# Patient Record
Sex: Male | Born: 1970
Health system: Southern US, Community
[De-identification: ages and names within clinical notes are randomized; demographics above are authoritative.]

## PROBLEM LIST (undated history)

## (undated) DIAGNOSIS — I10 Essential (primary) hypertension: Secondary | ICD-10-CM

## (undated) DIAGNOSIS — M549 Dorsalgia, unspecified: Secondary | ICD-10-CM

## (undated) DIAGNOSIS — G8929 Other chronic pain: Secondary | ICD-10-CM

## (undated) DIAGNOSIS — L409 Psoriasis, unspecified: Principal | ICD-10-CM

## (undated) DIAGNOSIS — I4891 Unspecified atrial fibrillation: Secondary | ICD-10-CM

## (undated) DIAGNOSIS — E785 Hyperlipidemia, unspecified: Secondary | ICD-10-CM

## (undated) HISTORY — PX: HERNIA REPAIR: SHX51

## (undated) HISTORY — DX: Essential (primary) hypertension: I10

## (undated) HISTORY — DX: Hyperlipidemia, unspecified: E78.5

## (undated) HISTORY — PX: INGUINAL HERNIA REPAIR: SUR1180

## (undated) HISTORY — DX: Psoriasis, unspecified: L40.9

---

## 2008-06-03 ENCOUNTER — Encounter: Payer: Self-pay | Admitting: Internal Medicine

## 2008-06-20 ENCOUNTER — Encounter: Payer: Self-pay | Admitting: Internal Medicine

## 2008-07-08 ENCOUNTER — Ambulatory Visit: Payer: Self-pay | Admitting: Internal Medicine

## 2008-07-08 DIAGNOSIS — E785 Hyperlipidemia, unspecified: Secondary | ICD-10-CM | POA: Insufficient documentation

## 2010-03-26 ENCOUNTER — Ambulatory Visit: Payer: Self-pay | Admitting: Internal Medicine

## 2010-04-03 ENCOUNTER — Ambulatory Visit: Payer: Self-pay | Admitting: Internal Medicine

## 2010-04-03 DIAGNOSIS — T753XXA Motion sickness, initial encounter: Secondary | ICD-10-CM | POA: Insufficient documentation

## 2010-04-03 LAB — CONVERTED CEMR LAB
HDL goal, serum: 40 mg/dL
LDL Goal: 160 mg/dL

## 2010-08-18 NOTE — Assessment & Plan Note (Signed)
Summary: req patch for a trip/cd   Vital Signs:  Patient profile:   40 year old male Height:      71 inches Weight:      190.25 pounds BMI:     26.63 O2 Sat:      97 % on Room air Temp:     97 degrees F oral Pulse rate:   70 / minute Pulse rhythm:   regular Resp:     20 per minute BP sitting:   134 / 80  (right arm)  Vitals Entered By: Cristy Hilts, RN (April 03, 2010 3:34 PM)  Nutrition Counseling: Patient's BMI is greater than 25 and therefore counseled on weight management options.  O2 Flow:  Room air CC: would like a script for nausea patch would like to discuss Chloesterol med (has been off Lipitor x1 yr., Lipid Management   Primary Care Georgie Eduardo:  Etta Grandchild MD  CC:  would like a script for nausea patch would like to discuss Chloesterol med (has been off Lipitor x1 yr. and Lipid Management.  History of Present Illness: He returns and requests an Rx for motion sickness, he is going on a fishing trip next week.  Also, he wants to treat his high cholesterol levels.  Lipid Management History:      Negative NCEP/ATP III risk factors include male age less than 18 years old, non-diabetic, no family history for ischemic heart disease, non-tobacco-user status, non-hypertensive, no ASHD (atherosclerotic heart disease), no prior stroke/TIA, no peripheral vascular disease, and no history of aortic aneurysm.        The patient states that he does not know about the "Therapeutic Lifestyle Change" diet.  His compliance with the TLC diet is good.  The patient expresses understanding of adjunctive measures for cholesterol lowering.  Adjunctive measures started by the patient include aerobic exercise, fiber, limit alcohol consumpton, and weight reduction.  He expresses no side effects from his lipid-lowering medication.  The patient denies any symptoms to suggest myopathy or liver disease.     Preventive Screening-Counseling & Management  Alcohol-Tobacco     Alcohol  drinks/day: 0     Smoking Status: never     Passive Smoke Exposure: no     Tobacco Counseling: not indicated; no tobacco use  Hep-HIV-STD-Contraception     Hepatitis Risk: no risk noted     HIV Risk: no risk noted     STD Risk: no risk noted      Sexual History:  currently monogamous.        Drug Use:  never.        Blood Transfusions:  no.    Medications Prior to Update: 1)  None  Current Medications (verified): 1)  Transderm-Scop 1.5 Mg Pt72 (Scopolamine Base) .... Apply As Directed 2)  Crestor 20 Mg Tabs (Rosuvastatin Calcium) .... One By Mouth Once Daily  Allergies (verified): 1)  ! Penicillin  Past History:  Past Medical History: Last updated: 07/08/2008 Hyperlipidemia  Past Surgical History: Last updated: 07/08/2008 Hemorrhoidectomy  Family History: Last updated: 07/08/2008 Family History Diabetes 1st degree relative Family History High cholesterol  Social History: Last updated: 04/03/2010 Occupation: firefighter Married Never Smoked Alcohol use-no Drug use-no Regular exercise-yes  Risk Factors: Alcohol Use: 0 (04/03/2010) Exercise: yes (07/08/2008)  Risk Factors: Smoking Status: never (04/03/2010) Passive Smoke Exposure: no (04/03/2010)  Social History: Occupation: IT sales professional Married Never Smoked Alcohol use-no Drug use-no Regular exercise-yes Hepatitis Risk:  no risk noted HIV Risk:  no risk noted STD  Risk:  no risk noted Sexual History:  currently monogamous Drug Use:  never Blood Transfusions:  no  Review of Systems  The patient denies anorexia, fever, weight loss, weight gain, chest pain, syncope, dyspnea on exertion, peripheral edema, prolonged cough, headaches, hemoptysis, abdominal pain, hematuria, suspicious skin lesions, transient blindness, difficulty walking, and enlarged lymph nodes.    Physical Exam  General:  alert, well-developed, well-nourished, well-hydrated, and appropriate dress.   Mouth:  Oral mucosa and  oropharynx without lesions or exudates.  Teeth in good repair. Neck:  supple, full ROM, no masses, and no thyromegaly.   Lungs:  Normal respiratory effort, chest expands symmetrically. Lungs are clear to auscultation, no crackles or wheezes. Heart:  Normal rate and regular rhythm. S1 and S2 normal without gallop, murmur, click, rub or other extra sounds. Abdomen:  soft, non-tender, normal bowel sounds, no distention, no masses, no guarding, no rigidity, no rebound tenderness, no hepatomegaly, and no splenomegaly.   Msk:  No deformity or scoliosis noted of thoracic or lumbar spine.   Pulses:  R and L carotid,radial,femoral,dorsalis pedis and posterior tibial pulses are full and equal bilaterally Extremities:  No clubbing, cyanosis, edema, or deformity noted with normal full range of motion of all joints.   Neurologic:  No cranial nerve deficits noted. Station and gait are normal. Plantar reflexes are down-going bilaterally. DTRs are symmetrical throughout. Sensory, motor and coordinative functions appear intact. Skin:  Intact without suspicious lesions or rashes Cervical Nodes:  No lymphadenopathy noted Inguinal Nodes:  no R inguinal adenopathy and no L inguinal adenopathy.   Psych:  Cognition and judgment appear intact. Alert and cooperative with normal attention span and concentration. No apparent delusions, illusions, hallucinations   Impression & Recommendations:  Problem # 1:  MOTION SICKNESS (ICD-994.6) Assessment New try transderm-scop patch  Problem # 2:  HYPERLIPIDEMIA (ICD-272.4) Assessment: Unchanged  His updated medication list for this problem includes:    Crestor 20 Mg Tabs (Rosuvastatin calcium) ..... One by mouth once daily  Lipid Goals: Chol Goal: 200 (04/03/2010)   HDL Goal: 40 (04/03/2010)   LDL Goal: 160 (04/03/2010)   TG Goal: 150 (04/03/2010)  10 Yr Risk Heart Disease: Not enough information  Complete Medication List: 1)  Transderm-scop 1.5 Mg Pt72 (Scopolamine  base) .... Apply as directed 2)  Crestor 20 Mg Tabs (Rosuvastatin calcium) .... One by mouth once daily  Lipid Assessment/Plan:      Based on NCEP/ATP III, the patient's risk factor category is "0-1 risk factors".  The patient's lipid goals are as follows: Total cholesterol goal is 200; LDL cholesterol goal is 160; HDL cholesterol goal is 40; Triglyceride goal is 150.    Patient Instructions: 1)  Please schedule a follow-up appointment in 2 months. Prescriptions: CRESTOR 20 MG TABS (ROSUVASTATIN CALCIUM) One by mouth once daily  #30 x 11   Entered and Authorized by:   Etta Grandchild MD   Signed by:   Etta Grandchild MD on 04/03/2010   Method used:   Print then Give to Patient   RxID:   [id number] TRANSDERM-SCOP 1.5 MG PT72 (SCOPOLAMINE BASE) apply as directed  #4 x 2   Entered and Authorized by:   Etta Grandchild MD   Signed by:   Etta Grandchild MD on 04/03/2010   Method used:   Print then Give to Patient   RxID:   [id number]

## 2011-08-10 ENCOUNTER — Ambulatory Visit (INDEPENDENT_AMBULATORY_CARE_PROVIDER_SITE_OTHER)
Admission: RE | Admit: 2011-08-10 | Discharge: 2011-08-10 | Disposition: A | Discharge status: Home / Self Care | Payer: 59 | Source: Ambulatory Visit | Attending: Internal Medicine | Admitting: Internal Medicine

## 2011-08-10 ENCOUNTER — Ambulatory Visit (INDEPENDENT_AMBULATORY_CARE_PROVIDER_SITE_OTHER): Payer: 59 | Admitting: Internal Medicine

## 2011-08-10 ENCOUNTER — Encounter: Payer: Self-pay | Admitting: Internal Medicine

## 2011-08-10 VITALS — BP 132/82 | HR 73 | Temp 97.1°F | Resp 16 | Wt 191.0 lb

## 2011-08-10 DIAGNOSIS — M25532 Pain in left wrist: Secondary | ICD-10-CM

## 2011-08-10 DIAGNOSIS — E785 Hyperlipidemia, unspecified: Secondary | ICD-10-CM

## 2011-08-10 DIAGNOSIS — M25539 Pain in unspecified wrist: Secondary | ICD-10-CM

## 2011-08-10 DIAGNOSIS — S63599A Other specified sprain of unspecified wrist, initial encounter: Secondary | ICD-10-CM

## 2011-08-10 MED ORDER — NAPROXEN 500 MG PO TABS: 500.0000 mg | ORAL_TABLET | Freq: Two times a day (BID) | ORAL | Status: DC

## 2011-08-10 MED ORDER — ROSUVASTATIN CALCIUM 20 MG PO TABS: 20.0000 mg | ORAL_TABLET | Freq: Every day | ORAL | Status: DC

## 2011-08-10 NOTE — Assessment & Plan Note (Signed)
Will start him on an nsaid and I gave him pt ed material as well

## 2011-08-10 NOTE — Assessment & Plan Note (Signed)
I will get a plain xray done to look for occult fracture, DJD, effusion

## 2011-08-10 NOTE — Patient Instructions (Signed)
Wrist Pain Wrist injuries are frequent in adults and children. A sprain is an injury to the ligaments that hold your bones together. A strain is an injury to muscle or muscle cord-like structures (tendons) from stretching or pulling. Generally, when wrists are moderately tender to touch following a fall or injury, a break in the bone (fracture) may be present. Most wrist sprains or strains are better in 3 to 5 days, but complete healing may take several weeks. HOME CARE INSTRUCTIONS   Put ice on the injured area.   Put ice in a plastic bag.   Place a towel between your skin and the bag.   Leave the ice on for 15 to 20 minutes, 3 to 4 times a day, for the first 2 days.   Keep your arm raised above the level of your heart whenever possible to reduce swelling and pain.   Rest the injured area for at least 48 hours or as directed by your caregiver.   If a splint or elastic bandage has been applied, use it for as long as directed by your caregiver or until seen by a caregiver for a follow-up exam.   Only take over-the-counter or prescription medicines for pain, discomfort, or fever as directed by your caregiver.   Keep all follow-up appointments. You may need to follow up with a specialist or have follow-up X-rays. Improvement in pain level is not a guarantee that you did not fracture a bone in your wrist. The only way to determine whether or not you have a broken bone is by X-ray.  SEEK IMMEDIATE MEDICAL CARE IF:   Your fingers are swollen, very red, white, or cold and blue.   Your fingers are numb or tingling.   You have increasing pain.   You have difficulty moving your fingers.  MAKE SURE YOU:   Understand these instructions.   Will watch your condition.   Will get help right away if you are not doing well or get worse.  Document Released: 04/14/2005 Document Revised: 03/17/2011 Document Reviewed: 08/26/2010 [employer] Patient Information 2012 [employer]. 

## 2011-08-10 NOTE — Progress Notes (Signed)
Subjective:    Patient ID: Wayne Morales, male    DOB: 04-Aug-1970, 41 y.o.   MRN: [id number]  Wrist Pain  The pain is present in the left wrist. This is a chronic problem. The current episode started more than 1 year ago. There has been a history of trauma. The problem occurs intermittently. The problem has been unchanged. The quality of the pain is described as aching. The pain is at a severity of 2/10. The pain is mild. Pertinent negatives include no fever, inability to bear weight, itching, joint locking, joint swelling, limited range of motion, numbness, stiffness or tingling. The symptoms are aggravated by activity. He has tried nothing for the symptoms. Family history does not include gout or rheumatoid arthritis. There is no history of diabetes, gout, osteoarthritis or rheumatoid arthritis.  Hyperlipidemia This is a chronic problem. The current episode started more than 1 year ago. The problem is controlled. Recent lipid tests were reviewed and are variable. He has no history of chronic renal disease, diabetes, hypothyroidism, liver disease, obesity or nephrotic syndrome. Factors aggravating his hyperlipidemia include no known factors. Pertinent negatives include no chest pain, focal sensory loss, focal weakness, leg pain, myalgias or shortness of breath. Current antihyperlipidemic treatment includes statins. The current treatment provides moderate improvement of lipids. There are no compliance problems.       Review of Systems  Constitutional: Negative for fever, chills, diaphoresis, activity change, appetite change, fatigue and unexpected weight change.  HENT: Negative.   Eyes: Negative.   Respiratory: Negative for cough, chest tightness, shortness of breath, wheezing and stridor.   Cardiovascular: Negative for chest pain, palpitations and leg swelling.  Gastrointestinal: Negative for nausea, vomiting, abdominal pain, diarrhea, constipation, blood in stool and abdominal distention.    Genitourinary: Negative.   Musculoskeletal: Positive for arthralgias (left wrist pain). Negative for myalgias, back pain, joint swelling, gait problem, stiffness and gout.  Skin: Negative for color change, itching, pallor, rash and wound.  Neurological: Negative for dizziness, tingling, tremors, focal weakness, seizures, syncope, facial asymmetry, speech difficulty, weakness, light-headedness, numbness and headaches.  Hematological: Negative for adenopathy. Does not bruise/bleed easily.  Psychiatric/Behavioral: Negative.        Objective:   Physical Exam  Vitals reviewed. Constitutional: He is oriented to person, place, and time. He appears well-developed and well-nourished. No distress.  HENT:  Head: Normocephalic and atraumatic.  Mouth/Throat: Oropharynx is clear and moist. No oropharyngeal exudate.  Eyes: Conjunctivae are normal. Right eye exhibits no discharge. Left eye exhibits no discharge. No scleral icterus.  Neck: Normal range of motion. Neck supple. No JVD present. No tracheal deviation present. No thyromegaly present.  Cardiovascular: Normal rate, regular rhythm, normal heart sounds and intact distal pulses.  Exam reveals no gallop and no friction rub.   No murmur heard. Pulmonary/Chest: Effort normal and breath sounds normal. No stridor. No respiratory distress. He has no wheezes. He has no rales. He exhibits no tenderness.  Abdominal: Soft. Bowel sounds are normal. He exhibits no distension. There is no tenderness. There is no rebound and no guarding.  Musculoskeletal: Normal range of motion. He exhibits no edema and no tenderness.       Left wrist: Normal. He exhibits normal range of motion, no tenderness, no bony tenderness, no swelling, no effusion, no crepitus, no deformity and no laceration.  Lymphadenopathy:    He has no cervical adenopathy.  Neurological: He is oriented to person, place, and time.  Skin: Skin is warm and dry. No rash noted. He  is not diaphoretic. No  erythema. No pallor.  Psychiatric: He has a normal mood and affect. His behavior is normal. Judgment and thought content normal.          Assessment & Plan:

## 2011-08-10 NOTE — Assessment & Plan Note (Signed)
He is doing well on crestor so will continue it for now

## 2011-10-21 ENCOUNTER — Ambulatory Visit (INDEPENDENT_AMBULATORY_CARE_PROVIDER_SITE_OTHER): Payer: 59 | Admitting: Internal Medicine

## 2011-10-21 ENCOUNTER — Encounter: Payer: Self-pay | Admitting: Internal Medicine

## 2011-10-21 VITALS — BP 130/82 | HR 76 | Temp 97.6°F | Wt 190.1 lb

## 2011-10-21 DIAGNOSIS — H109 Unspecified conjunctivitis: Secondary | ICD-10-CM

## 2011-10-21 MED ORDER — TOBRAMYCIN-DEXAMETHASONE 0.3-0.1 % OP SUSP: 1.0000 [drp] | OPHTHALMIC | Status: AC

## 2011-10-21 NOTE — Patient Instructions (Signed)
Conjunctivitis Conjunctivitis is commonly called "pink eye." Conjunctivitis can be caused by bacterial or viral infection, allergies, or injuries. There is usually redness of the lining of the eye, itching, discomfort, and sometimes discharge. There may be deposits of matter along the eyelids. A viral infection usually causes a watery discharge, while a bacterial infection causes a yellowish, thick discharge. Pink eye is very contagious and spreads by direct contact. You may be given antibiotic eyedrops as part of your treatment. Before using your eye medicine, remove all drainage from the eye by washing gently with warm water and cotton balls. Continue to use the medication until you have awakened 2 mornings in a row without discharge from the eye. Do not rub your eye. This increases the irritation and helps spread infection. Use separate towels from other household members. Wash your hands with soap and water before and after touching your eyes. Use cold compresses to reduce pain and sunglasses to relieve irritation from light. Do not wear contact lenses or wear eye makeup until the infection is gone. SEEK MEDICAL CARE IF:   Your symptoms are not better after 3 days of treatment.   You have increased pain or trouble seeing.   The outer eyelids become very red or swollen.  Document Released: 08/12/2004 Document Revised: 06/24/2011 Document Reviewed: 07/05/2005 [employer] Patient Information 2012 [employer]. 

## 2011-10-21 NOTE — Progress Notes (Signed)
Subjective:    Patient ID: Wayne Morales, male    DOB: August 27, 1970, 41 y.o.   MRN: [id number]  Conjunctivitis  The current episode started 2 days ago. The onset was gradual. The problem occurs continuously. The problem has been unchanged. The problem is mild. Associated symptoms include eye itching, photophobia, eye discharge and eye redness. Pertinent negatives include no orthopnea, no fever, no decreased vision, no double vision, no abdominal pain, no constipation, no diarrhea, no nausea, no vomiting, no congestion, no ear discharge, no ear pain, no headaches, no hearing loss, no mouth sores, no rhinorrhea, no sore throat, no stridor, no swollen glands, no muscle aches, no neck pain, no neck stiffness, no cough, no URI, no wheezing, no rash and no eye pain. The eye pain is mild. There is pain in the left eye. The eye pain is not associated with movement. The eyelid exhibits swelling. There were no sick contacts.      Review of Systems  Constitutional: Negative.  Negative for fever.  HENT: Negative.  Negative for hearing loss, ear pain, congestion, sore throat, rhinorrhea, mouth sores, neck pain and ear discharge.   Eyes: Positive for photophobia, discharge, redness and itching. Negative for double vision and pain.  Respiratory: Negative.  Negative for cough, wheezing and stridor.   Cardiovascular: Negative.  Negative for orthopnea.  Gastrointestinal: Negative.  Negative for nausea, vomiting, abdominal pain, diarrhea and constipation.  Genitourinary: Negative.   Musculoskeletal: Negative.   Skin: Negative.  Negative for rash.  Neurological: Negative.  Negative for headaches.  Hematological: Negative.   Psychiatric/Behavioral: Negative.        Objective:   Physical Exam  Vitals reviewed. Constitutional: He is oriented to person, place, and time. He appears well-developed and well-nourished. No distress.  HENT:  Head: Normocephalic and atraumatic.  Mouth/Throat: No oropharyngeal  exudate.  Eyes: Pupils are equal, round, and reactive to light. Right eye exhibits no chemosis, no discharge, no exudate and no hordeolum. No foreign body present in the right eye. Left eye exhibits no chemosis, no discharge, no exudate and no hordeolum. No foreign body present in the left eye. Right conjunctiva is not injected. Right conjunctiva has no hemorrhage. Left conjunctiva is injected. Left conjunctiva has no hemorrhage. No scleral icterus. Right eye exhibits normal extraocular motion and no nystagmus. Left eye exhibits normal extraocular motion and no nystagmus. Right pupil is round and reactive. Left pupil is round and reactive. Pupils are equal.  Slit lamp exam:      The right eye shows no corneal abrasion and no corneal ulcer.       The left eye shows no corneal abrasion and no corneal ulcer.    Neck: Normal range of motion. Neck supple. No JVD present. No tracheal deviation present. No thyromegaly present.  Cardiovascular: Normal rate, regular rhythm, normal heart sounds and intact distal pulses.  Exam reveals no gallop and no friction rub.   No murmur heard. Pulmonary/Chest: Effort normal and breath sounds normal. No stridor. No respiratory distress. He has no wheezes. He has no rales. He exhibits no tenderness.  Abdominal: Soft. Bowel sounds are normal. He exhibits no distension and no mass. There is no tenderness. There is no rebound and no guarding.  Musculoskeletal: Normal range of motion. He exhibits no edema and no tenderness.  Lymphadenopathy:    He has no cervical adenopathy.  Neurological: He is oriented to person, place, and time.  Skin: Skin is warm and dry. No rash noted. He is not diaphoretic. No  erythema. No pallor.  Psychiatric: He has a normal mood and affect. His behavior is normal. Judgment and thought content normal.          Assessment & Plan:

## 2011-10-21 NOTE — Assessment & Plan Note (Signed)
Start tobradex ophth suspension, he was given pt ed material as well

## 2012-09-29 ENCOUNTER — Other Ambulatory Visit: Payer: Self-pay | Admitting: Internal Medicine

## 2012-11-03 ENCOUNTER — Encounter: Payer: Self-pay | Admitting: Internal Medicine

## 2012-11-03 ENCOUNTER — Other Ambulatory Visit (INDEPENDENT_AMBULATORY_CARE_PROVIDER_SITE_OTHER): Payer: 59

## 2012-11-03 ENCOUNTER — Ambulatory Visit (INDEPENDENT_AMBULATORY_CARE_PROVIDER_SITE_OTHER): Payer: 59 | Admitting: Internal Medicine

## 2012-11-03 VITALS — BP 122/78 | HR 66 | Temp 97.0°F | Ht 71.0 in | Wt 195.2 lb

## 2012-11-03 DIAGNOSIS — L408 Other psoriasis: Secondary | ICD-10-CM

## 2012-11-03 DIAGNOSIS — B351 Tinea unguium: Secondary | ICD-10-CM

## 2012-11-03 DIAGNOSIS — L409 Psoriasis, unspecified: Secondary | ICD-10-CM

## 2012-11-03 LAB — HEPATIC FUNCTION PANEL
ALT: 32 U/L (ref 0–53)
Bilirubin, Direct: 0.1 mg/dL (ref 0.0–0.3)
Total Protein: 8 g/dL (ref 6.0–8.3)

## 2012-11-03 MED ORDER — PREDNISONE 20 MG PO TABS: 20.0000 mg | ORAL_TABLET | Freq: Every day | ORAL | Status: DC

## 2012-11-03 MED ORDER — TERBINAFINE HCL 250 MG PO TABS: 250.0000 mg | ORAL_TABLET | Freq: Every day | ORAL | Status: DC

## 2012-11-03 NOTE — Progress Notes (Signed)
  Subjective:    Patient ID: Wayne Morales, male    DOB: [birth year], 42 y.o.   MRN: [id number]  HPI  Pt presents to the clinic today with c/o rash on his scalp. He noticed this 1 week ago. He does have a history of scalp psoriasis. It has flared up again and he is unable to get it to go away. It does itch. He has used coal tar shampoo in the past but would prefer to try oral therapy secondary to the cost of the shampoo. Additionally, he c/o toe nail fungus. He does wear boots all the time for his job. His feet do sweat. He has not tried any topical treatment for it. He would like to get treatment today. He has never had a issue with his liver.  Review of Systems      Past Medical History  Diagnosis Date  . Hyperlipidemia     Current Outpatient Prescriptions  Medication Sig Dispense Refill  . CRESTOR 20 MG tablet TAKE 1 TABLET BY MOUTH EVERY DAY  30 tablet  3   No current facility-administered medications for this visit.    Allergies  Allergen Reactions  . Penicillins     REACTION: Rash    Family History  Problem Relation Age of Onset  . Diabetes Other   . Hyperlipidemia Other     History   Social History  . Marital Status: Married    Spouse Name: N/A    Number of Children: N/A  . Years of Education: N/A   Occupational History  . Not on file.   Social History Main Topics  . Smoking status: Never Smoker   . Smokeless tobacco: Never Used  . Alcohol Use: No  . Drug Use: No  . Sexually Active: Yes   Other Topics Concern  . Not on file   Social History Narrative  . No narrative on file     Constitutional: Denies fever, malaise, fatigue, headache or abrupt weight changes. .  Skin: Pt reports psoriasis in his scalp and toe fungus. Denies redness, rashes, lesions or ulcercations.    No other specific complaints in a complete review of systems (except as listed in HPI above).  Objective:   Physical Exam  BP 122/78  Pulse 66  Temp(Src) 97 F (36.1 C)  (Oral)  Ht 5\' 11"  (1.803 m)  Wt 195 lb 4 oz (88.565 kg)  BMI 27.24 kg/m2  SpO2 95% Wt Readings from Last 3 Encounters:  11/03/12 195 lb 4 oz (88.565 kg)  10/21/11 190 lb 2 oz (86.24 kg)  08/10/11 191 lb (86.637 kg)    General: Appears their stated age, well developed, well nourished in NAD. Skin: Evidence of psoriasis at the base of the scalp. Pt has bilateral toe nail fungus. Cardiovascular: Normal rate and rhythm. S1,S2 noted.  No murmur, rubs or gallops noted. No JVD or BLE edema. No carotid bruits noted. Pulmonary/Chest: Normal effort and positive vesicular breath sounds. No respiratory distress. No wheezes, rales or ronchi noted.       Assessment & Plan:   Scalp psoriasis, recurrent:  eRx for prednisone x 10 days Avoid scratching the affected area  Toe fungus, bilaterally:  Will check LFT's today eRx for Terbinafine daily x 12 weeks Will recheck LFT's in 4 weeks  RTC as needed or if symptoms persist or get worse

## 2012-11-03 NOTE — Patient Instructions (Signed)
Psoriasis Psoriasis is a common, long-lasting (chronic) inflammation of the skin. It affects both men and women equally, of all ages and all races. Psoriasis cannot be passed from person to person (not contagious). Psoriasis varies from mild to very severe. When severe, it can greatly affect your quality of life. Psoriasis is an inflammatory disorder affecting the skin as well as other organs including the joints (causing an arthritis). With psoriasis, the skin sheds its top layer of cells more rapidly than it does in someone without psoriasis. CAUSES  The cause of psoriasis is largely unknown. Genetics, your immune system, and the environment seem to play a role in causing psoriasis. Factors that can make psoriasis worse include:  Damage or trauma to the skin, such as cuts, scrapes, and sunburn. This damage often causes new areas of psoriasis (lesions).  Winter dryness and lack of sunlight.  Medicines such as lithium, beta-blockers, antimalarial drugs, ACE inhibitors, nonsteroidal anti-inflammatory drugs (ibuprofen, aspirin), and terbinafine. Let your caregiver know if you are taking any of these drugs.  Alcohol. Excessive alcohol use should be avoided if you have psoriasis. Drinking large amounts of alcohol can affect:  How well your psoriasis treatment works.  How safe your psoriasis treatment is.  Smoking. If you smoke, ask your caregiver for help to quit.  Stress.  Bacterial or viral infections.  Arthritis. Arthritis associated with psoriasis (psoriatic arthritis) affects less than 10% of patients with psoriasis. The arthritic intensity does not always match the skin psoriasis intensity. It is important to let your caregiver know if your joints hurt or if they are stiff. SYMPTOMS  The most common form of psoriasis begins with little red bumps that gradually become larger. The bumps begin to form scales that flake off easily. The lower layers of scales stick together. When these scales  are scratched or removed, the underlying skin is tender and bleeds easily. These areas then grow in size and may become large. Psoriasis often creates a rash that looks the same on both sides of the body (symmetrical). It often affects the elbows, knees, groin, genitals, arms, legs, scalp, and nails. Affected nails often have pitting, loosen, thicken, crumble, and are difficult to treat.  "Inverse psoriasis"occurs in the armpits, under breasts, in skin folds, and around the groin, buttocks, and genitals.  "Guttate psoriasis" generally occurs in children and young adults following a recent sore throat (strep throat). It begins with many small, red, scaly spots on the skin. It clears spontaneously in weeks or a few months without treatment. DIAGNOSIS  Psoriasis is diagnosed by physical exam. A tissue sample (biopsy) may also be taken. TREATMENT The treatment of psoriasis depends on your age, health, and living conditions.  Steroid (cortisone) creams, lotions, and ointments may be used. These treatments are associated with thinning of the skin, blood vessels that get larger (dilated), loss of skin pigmentation, and easy bruising. It is important to use these steroids as directed by your caregiver. Only treat the affected areas and not the normal, unaffected skin. People on long-term steroid treatment should wear a medical alert bracelet. Injections may be used in areas that are difficult to treat.  Scalp treatments are available as shampoos, solutions, sprays, foams, and oils. Avoid scratching the scalp and picking at the scales.  Anthralin medicine works well on areas that are difficult to treat. However, it stains clothes and skin and may cause temporary irritation.  Synthetic vitamin D (calcipotriene)can be used on small areas. It is available by prescription. The forms   of synthetic vitamin D available in health food stores do not help with psoriasis.  Coal tarsare available in various strengths  for psoriasis that is difficult to treat. They are one of the longest used treatments for difficult to treat psoriasis. However, they are messy to use.  Light therapy (UV therapy) can be carefully and professionally monitored in a dermatologist's office. Careful sunbathing is helpful for many people as directed by your caregiver. The exposure should be just long enough to cause a mild redness (erythema) of your skin. Avoid sunburn as this may make the condition worse. Sunscreen (SPF of 30 or higher) should be used to protect against sunburn. Cataracts, wrinkles, and skin aging are some of the harmful side effects of light therapy.  If creams (topical medicines) fail, there are several other options for systemic or oral medicines your caregiver can suggest. Psoriasis can sometimes be very difficult to treat. It can come and go. It is necessary to follow up with your caregiver regularly if your psoriasis is difficult to treat. Usually, with persistence you can get a good amount of relief. Maintaining consistent care is important. Do not change caregivers just because you do not see immediate results. It may take several trials to find the right combination of treatment for you. PREVENTING FLARE-UPS  Wear gloves while you wash dishes, while cleaning, and when you are outside in the cold.  If you have radiators, place a bowl of water or damp towel on the radiator. This will help put water back in the air. You can also use a humidifier to keep the air moist. Try to keep the humidity at about 60% in your home.  Apply moisturizer while your skin is still damp from bathing or showering. This traps water in the skin.  Avoid long, hot baths or showers. Keep soap use to a minimum. Soaps dry out the skin and wash away the protective oils. Use a fragrance free, dye free soap.  Drink enough water and fluids to keep your urine clear or pale yellow. Not drinking enough water depletes your skin's water  supply.  Turn off the heat at night and keep it low during the day. Cool air is less drying. SEEK MEDICAL CARE IF:  You have increasing pain in the affected areas.  You have uncontrolled bleeding in the affected areas.  You have increasing redness or warmth in the affected areas.  You start to have pain or stiffness in your joints.  You start feeling depressed about your condition.  You have a fever. Document Released: 07/02/2000 Document Revised: 09/27/2011 Document Reviewed: 12/28/2010 [employer] Patient Information 2013 [employer].  

## 2012-12-15 ENCOUNTER — Ambulatory Visit (INDEPENDENT_AMBULATORY_CARE_PROVIDER_SITE_OTHER): Payer: 59 | Admitting: Internal Medicine

## 2012-12-15 ENCOUNTER — Encounter: Payer: Self-pay | Admitting: Internal Medicine

## 2012-12-15 VITALS — BP 140/98 | HR 82 | Temp 97.1°F | Ht 67.0 in | Wt 193.2 lb

## 2012-12-15 DIAGNOSIS — L408 Other psoriasis: Secondary | ICD-10-CM

## 2012-12-15 DIAGNOSIS — L409 Psoriasis, unspecified: Secondary | ICD-10-CM | POA: Insufficient documentation

## 2012-12-15 HISTORY — DX: Psoriasis, unspecified: L40.9

## 2012-12-15 MED ORDER — CLOBETASOL PROP EMOLLIENT BASE 0.05 % EX CREA: TOPICAL_CREAM | CUTANEOUS | Status: DC

## 2012-12-15 NOTE — Patient Instructions (Signed)
Please take all new medication as prescribed Please continue all other medications as before, and refills have been done if requested. Please call if not improved, for dermatology referral

## 2012-12-15 NOTE — Assessment & Plan Note (Signed)
?   Pustular psoriasis vs common psoriasis, ok for clobetasol cream,  to f/u any worsening symptoms or concerns, consider derm referral

## 2012-12-15 NOTE — Progress Notes (Signed)
  Subjective:    Patient ID: Wayne Morales, male    DOB: Oct 13, 1970, 41 y.o.   MRN: [id number]  HPI  Here to f/u, unfortunately skin condition essentially no change despite short course recent prednisone.  C/w scalp psoriasis per pt dx per derm, tx with steroid foam years ago but has to stop due to cost.  Now area of concern at least twice as large than years past with some increased discomfort as well, seemed to start at nape of neck and extend toward crown essentially involving the whole bilat poast head/occiput.   Pt denies fever, wt loss, night sweats, loss of appetite, or other constitutional symptoms  No drainage or fluctuance or hx of MRSA.   Has been worse ? Related to stress recently. Has not tried the lamisil yet for the onychomycosis Past Medical History  Diagnosis Date  . Hyperlipidemia   . Scalp psoriasis 12/15/2012   Past Surgical History  Procedure Laterality Date  . Hemorroidectomy      reports that he has never smoked. He has never used smokeless tobacco. He reports that he does not drink alcohol or use illicit drugs. family history includes Diabetes in his other and Hyperlipidemia in his other. Allergies  Allergen Reactions  . Penicillins     REACTION: Rash   Current Outpatient Prescriptions on File Prior to Visit  Medication Sig Dispense Refill  . CRESTOR 20 MG tablet TAKE 1 TABLET BY MOUTH EVERY DAY  30 tablet  3  . predniSONE (DELTASONE) 20 MG tablet Take 1 tablet (20 mg total) by mouth daily.  10 tablet  0  . terbinafine (LAMISIL) 250 MG tablet Take 1 tablet (250 mg total) by mouth daily.  30 tablet  2   No current facility-administered medications on file prior to visit.   Review of Systems All otherwise neg per pt    Objective:   Physical Exam BP 140/98  Pulse 82  Temp(Src) 97.1 F (36.2 C) (Oral)  Ht 5\' 7"  (1.702 m)  Wt 193 lb 4 oz (87.658 kg)  BMI 30.26 kg/m2  SpO2 96% VS noted,  Constitutional: Pt appears well-developed and well-nourished.  HENT:  Head: NCAT.  Right Ear: External ear normal.  Left Ear: External ear normal.  Eyes: Conjunctivae and EOM are normal. Pupils are equal, round, and reactive to light.  Neck: Normal range of motion. Neck supple.  Cardiovascular: Normal rate and regular rhythm.   Pulmonary/Chest: Effort normal and breath sounds normal.  Neurological: Pt is alert. Not confused  Skin: Nape of neck with numerous pustular like lesions, contigous with occiput/post head to crown large area mild tender whitish plaque with scaliness Psychiatric: Pt behavior is normal. Thought content normal.     Assessment & Plan:

## 2013-08-16 ENCOUNTER — Ambulatory Visit (INDEPENDENT_AMBULATORY_CARE_PROVIDER_SITE_OTHER): Payer: 59 | Admitting: Internal Medicine

## 2013-08-16 ENCOUNTER — Encounter: Payer: Self-pay | Admitting: Internal Medicine

## 2013-08-16 VITALS — BP 128/90 | HR 75 | Temp 97.5°F | Ht 67.0 in | Wt 199.5 lb

## 2013-08-16 DIAGNOSIS — L409 Psoriasis, unspecified: Secondary | ICD-10-CM

## 2013-08-16 DIAGNOSIS — L408 Other psoriasis: Secondary | ICD-10-CM

## 2013-08-16 DIAGNOSIS — J209 Acute bronchitis, unspecified: Secondary | ICD-10-CM | POA: Insufficient documentation

## 2013-08-16 DIAGNOSIS — R062 Wheezing: Secondary | ICD-10-CM

## 2013-08-16 MED ORDER — SCOPOLAMINE 1 MG/3DAYS TD PT72: 1.0000 | MEDICATED_PATCH | TRANSDERMAL | Status: DC

## 2013-08-16 MED ORDER — HYDROCODONE-HOMATROPINE 5-1.5 MG/5ML PO SYRP: 5.0000 mL | ORAL_SOLUTION | Freq: Four times a day (QID) | ORAL | Status: DC | PRN

## 2013-08-16 MED ORDER — FLUOCINONIDE 0.05 % EX SOLN: 1.0000 "application " | Freq: Two times a day (BID) | CUTANEOUS | Status: DC

## 2013-08-16 MED ORDER — PREDNISONE 10 MG PO TABS: ORAL_TABLET | ORAL | Status: DC

## 2013-08-16 MED ORDER — AZITHROMYCIN 250 MG PO TABS: ORAL_TABLET | ORAL | Status: DC

## 2013-08-16 NOTE — Assessment & Plan Note (Signed)
Mild to mod, for antibx course,  to f/u any worsening symptoms or concerns 

## 2013-08-16 NOTE — Patient Instructions (Addendum)
Please take all new medication as prescribed Please continue all other medications as before, and refills have been done if requested. Please have the pharmacy call with any other refills you may need.  Please fax the results of your recent lab tests with your [employer] physical to [phone]  Please remember to sign up for My Chart if you have not done so, as this will be important to you in the future with finding out test results, communicating by private email, and scheduling acute appointments online when needed.

## 2013-08-16 NOTE — Assessment & Plan Note (Signed)
For change to lidex .05 solution as foam is so expensive,  to f/u any worsening symptoms or concerns

## 2013-08-16 NOTE — Addendum Note (Signed)
Addended by: Corwin LevinsJOHN, Ladarrious Kirksey W on: 08/16/2013 10:24 AM   Modules accepted: Orders

## 2013-08-16 NOTE — Assessment & Plan Note (Signed)
Mild, likely bronchospasm such as asthmatic bronchitis, gave xopenex HFA for prn use as sample, also pred 10 qd for 5 days only

## 2013-08-16 NOTE — Progress Notes (Signed)
Pre-visit discussion using our clinic review tool. No additional management support is needed unless otherwise documented below in the visit note.  

## 2013-08-16 NOTE — Progress Notes (Addendum)
   Subjective:    Patient ID: Fransisca ConnorsMichael Boudoin, male    DOB: 06/24/1971, 43 y.o.   MRN: [id number]  HPI  Here with acute onset mild to mod 2-3 days ST, HA, general weakness and malaise, with prod cough greenish sputum, as well as eye congestion, mild wheezing and sob, but Pt denies chest pain,, orthopnea, PND, increased LE swelling, palpitations, dizziness or syncope. No hx of asthma or copd.   Pt denies polydipsia, polyuria,   Cant sleep due to cough at night.  Last rx for clobetasol .05% emolliant cr did not seem to work well for his scalp psoriaiss. 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 Also asks for transderm scop with a trip coming soon Past Medical History  Diagnosis Date  . Hyperlipidemia   . Scalp psoriasis 12/15/2012   Past Surgical History  Procedure Laterality Date  . Hemorroidectomy      reports that he has never smoked. He has never used smokeless tobacco. He reports that he does not drink alcohol or use illicit drugs. family history includes Diabetes in his other; Hyperlipidemia in his other. Allergies  Allergen Reactions  . Penicillins     REACTION: Rash   Current Outpatient Prescriptions on File Prior to Visit  Medication Sig Dispense Refill  . CRESTOR 20 MG tablet TAKE 1 TABLET BY MOUTH EVERY DAY  30 tablet  3  . terbinafine (LAMISIL) 250 MG tablet Take 1 tablet (250 mg total) by mouth daily.  30 tablet  2   No current facility-administered medications on file prior to visit.   Review of Systems  Constitutional: Negative for unexpected weight change, or unusual diaphoresis  HENT: Negative for tinnitus.   Eyes: Negative for photophobia and visual disturbance.  Respiratory: Negative for choking and stridor.   Gastrointestinal: Negative for vomiting and blood in stool.  Genitourinary: Negative for hematuria and decreased urine volume.  Musculoskeletal: Negative for acute joint swelling Skin: Negative for color change and wound.  Neurological: Negative  for tremors and numbness other than noted  Psychiatric/Behavioral: Negative for decreased concentration or  hyperactivity.       Objective:   Physical Exam BP 128/90  Pulse 75  Temp(Src) 97.5 F (36.4 C) (Oral)  Ht 5\' 7"  (1.702 m)  Wt 199 lb 8 oz (90.493 kg)  BMI 31.24 kg/m2  SpO2 96% VS noted,  Constitutional: Pt appears well-developed and well-nourished.  HENT: Head: NCAT.  Right Ear: External ear normal.  Left Ear: External ear normal.  Eyes: Conjunctivae and EOM are normal. Pupils are equal, round, and reactive to light.  Neck: Normal range of motion. Neck supple.  Cardiovascular: Normal rate and regular rhythm.   Pulmonary/Chest: Effort normal and breath sounds decreased, few wheeze bilat Neurological: Pt is alert. Not confused  Skin: Skin is warm. No erythema. psoriasis noted to scalp Psychiatric: Pt behavior is normal. Thought content normal.     Assessment & Plan:

## 2014-06-30 ENCOUNTER — Other Ambulatory Visit: Payer: Self-pay | Admitting: Internal Medicine

## 2014-07-08 ENCOUNTER — Other Ambulatory Visit (INDEPENDENT_AMBULATORY_CARE_PROVIDER_SITE_OTHER): Payer: 59

## 2014-07-08 ENCOUNTER — Ambulatory Visit (INDEPENDENT_AMBULATORY_CARE_PROVIDER_SITE_OTHER): Payer: 59 | Admitting: Internal Medicine

## 2014-07-08 ENCOUNTER — Encounter: Payer: Self-pay | Admitting: Internal Medicine

## 2014-07-08 VITALS — BP 148/96 | HR 72 | Temp 97.5°F | Resp 16 | Ht 67.0 in | Wt 194.0 lb

## 2014-07-08 DIAGNOSIS — E785 Hyperlipidemia, unspecified: Secondary | ICD-10-CM

## 2014-07-08 DIAGNOSIS — I10 Essential (primary) hypertension: Secondary | ICD-10-CM | POA: Insufficient documentation

## 2014-07-08 LAB — LIPID PANEL
CHOLESTEROL: 277 mg/dL — AB (ref 0–200)
HDL: 28.2 mg/dL — ABNORMAL LOW (ref >39.00)
NonHDL: 248.8
TRIGLYCERIDES: 423 mg/dL — AB (ref 0.0–149.0)
Total CHOL/HDL Ratio: 10
VLDL: 84.6 mg/dL — AB (ref 0.0–40.0)

## 2014-07-08 LAB — COMPREHENSIVE METABOLIC PANEL
ALT: 32 U/L (ref 0–53)
AST: 23 U/L (ref 0–37)
Albumin: 4.7 g/dL (ref 3.5–5.2)
Alkaline Phosphatase: 92 U/L (ref 39–117)
BUN: 16 mg/dL (ref 6–23)
CALCIUM: 9.7 mg/dL (ref 8.4–10.5)
CHLORIDE: 103 meq/L (ref 96–112)
CO2: 26 mEq/L (ref 19–32)
CREATININE: 1.2 mg/dL (ref 0.4–1.5)
GFR: 73.7 mL/min (ref >60.00)
GLUCOSE: 113 mg/dL — AB (ref 70–99)
Potassium: 4 mEq/L (ref 3.5–5.1)
Sodium: 137 mEq/L (ref 135–145)
Total Bilirubin: 0.9 mg/dL (ref 0.2–1.2)
Total Protein: 8.1 g/dL (ref 6.0–8.3)

## 2014-07-08 LAB — CBC WITH DIFFERENTIAL/PLATELET
BASOS PCT: 0.3 % (ref 0.0–3.0)
Basophils Absolute: 0 10*3/uL (ref 0.0–0.1)
EOS PCT: 2.3 % (ref 0.0–5.0)
Eosinophils Absolute: 0.2 10*3/uL (ref 0.0–0.7)
HCT: 48.8 % (ref 39.0–52.0)
HEMOGLOBIN: 16.3 g/dL (ref 13.0–17.0)
LYMPHS PCT: 30.5 % (ref 12.0–46.0)
Lymphs Abs: 2.5 10*3/uL (ref 0.7–4.0)
MCHC: 33.4 g/dL (ref 30.0–36.0)
MCV: 94.6 fl (ref 78.0–100.0)
MONOS PCT: 6.5 % (ref 3.0–12.0)
Monocytes Absolute: 0.5 10*3/uL (ref 0.1–1.0)
NEUTROS PCT: 60.4 % (ref 43.0–77.0)
Neutro Abs: 4.9 10*3/uL (ref 1.4–7.7)
Platelets: 187 10*3/uL (ref 150.0–400.0)
RBC: 5.16 Mil/uL (ref 4.22–5.81)
RDW: 12.8 % (ref 11.5–15.5)
WBC: 8.1 10*3/uL (ref 4.0–10.5)

## 2014-07-08 LAB — LDL CHOLESTEROL, DIRECT: LDL DIRECT: 138.7 mg/dL

## 2014-07-08 MED ORDER — EZETIMIBE 10 MG PO TABS: 10.0000 mg | ORAL_TABLET | Freq: Every day | ORAL | Status: DC

## 2014-07-08 MED ORDER — ROSUVASTATIN CALCIUM 20 MG PO TABS: 20.0000 mg | ORAL_TABLET | Freq: Every day | ORAL | Status: DC

## 2014-07-08 MED ORDER — NEBIVOLOL HCL 10 MG PO TABS: 10.0000 mg | ORAL_TABLET | Freq: Every day | ORAL | Status: DC

## 2014-07-08 NOTE — Progress Notes (Signed)
   Subjective:    Patient ID: Wayne Morales, male    DOB: 04/24/1971, 43 y.o.   MRN: [id number]  HPI Comments: He had labs done at work and was told that his chol was >500. He does not bring any copies of labs with him today.  Hyperlipidemia This is a chronic problem. The current episode started more than 1 year ago. The problem is uncontrolled. Recent lipid tests were reviewed and are high. He has no history of chronic renal disease, diabetes, hypothyroidism, liver disease, obesity or nephrotic syndrome. There are no known factors aggravating his hyperlipidemia. Pertinent negatives include no chest pain, focal sensory loss, focal weakness, leg pain, myalgias or shortness of breath. He is currently on no antihyperlipidemic treatment. The current treatment provides no improvement of lipids. Compliance problems include psychosocial issues.       Review of Systems  Constitutional: Negative.  Negative for fever, chills, diaphoresis, appetite change and fatigue.  HENT: Negative.   Eyes: Negative.   Respiratory: Negative.  Negative for cough, choking, chest tightness, shortness of breath and stridor.   Cardiovascular: Negative.  Negative for chest pain, palpitations and leg swelling.  Gastrointestinal: Negative.  Negative for nausea, vomiting, abdominal pain, diarrhea, constipation and blood in stool.  Endocrine: Negative.   Genitourinary: Negative.   Musculoskeletal: Negative.  Negative for myalgias, back pain, arthralgias and neck pain.  Skin: Negative.  Negative for rash.  Allergic/Immunologic: Negative.   Neurological: Negative.  Negative for focal weakness.  Hematological: Negative.  Negative for adenopathy. Does not bruise/bleed easily.  Psychiatric/Behavioral: Negative.        Objective:   Physical Exam  Constitutional: He is oriented to person, place, and time. He appears well-developed and well-nourished. No distress.  HENT:  Head: Normocephalic and atraumatic.  Mouth/Throat:  Oropharynx is clear and moist. No oropharyngeal exudate.  Eyes: Conjunctivae are normal. Right eye exhibits no discharge. Left eye exhibits no discharge. No scleral icterus.  Neck: Normal range of motion. Neck supple. No JVD present. No tracheal deviation present. No thyromegaly present.  Cardiovascular: Normal rate, regular rhythm, normal heart sounds and intact distal pulses.  Exam reveals no gallop and no friction rub.   No murmur heard. Pulmonary/Chest: Effort normal and breath sounds normal. No stridor. No respiratory distress. He has no wheezes. He has no rales. He exhibits no tenderness.  Abdominal: Soft. Bowel sounds are normal. He exhibits no distension and no mass. There is no tenderness. There is no rebound and no guarding.  Musculoskeletal: Normal range of motion. He exhibits no edema or tenderness.  Lymphadenopathy:    He has no cervical adenopathy.  Neurological: He is oriented to person, place, and time.  Skin: Skin is warm and dry. No rash noted. He is not diaphoretic. No erythema. No pallor.  Psychiatric: He has a normal mood and affect. His behavior is normal. Judgment and thought content normal.  Vitals reviewed.    Lab Results  Component Value Date   ALT 32 11/03/2012   AST 29 11/03/2012       Assessment & Plan:

## 2014-07-08 NOTE — Assessment & Plan Note (Addendum)
If his chol is >500 then will need at least 2 agents to lower this - will restart crestor and will add zetia Will check his FLP today to see the actual numbers, will also check for thyroid disease

## 2014-07-08 NOTE — Assessment & Plan Note (Signed)
His BP is too high, will start bystolic I will also check his labs today to screen for end organ damage and secondary causes of HTN

## 2014-07-08 NOTE — Patient Instructions (Signed)

## 2014-07-09 ENCOUNTER — Encounter: Payer: Self-pay | Admitting: Internal Medicine

## 2014-07-09 LAB — TSH: TSH: 0.59 u[IU]/mL (ref 0.35–4.50)

## 2014-12-21 ENCOUNTER — Encounter (HOSPITAL_COMMUNITY): Payer: Self-pay

## 2014-12-21 ENCOUNTER — Emergency Department (HOSPITAL_COMMUNITY): Payer: 59

## 2014-12-21 ENCOUNTER — Emergency Department (HOSPITAL_COMMUNITY)
Admission: EM | Admit: 2014-12-21 | Discharge: 2014-12-21 | Disposition: A | Discharge status: Home / Self Care | Payer: 59 | Attending: Emergency Medicine | Admitting: Emergency Medicine

## 2014-12-21 DIAGNOSIS — M549 Dorsalgia, unspecified: Secondary | ICD-10-CM

## 2014-12-21 DIAGNOSIS — Z872 Personal history of diseases of the skin and subcutaneous tissue: Secondary | ICD-10-CM | POA: Diagnosis not present

## 2014-12-21 DIAGNOSIS — M5126 Other intervertebral disc displacement, lumbar region: Secondary | ICD-10-CM | POA: Insufficient documentation

## 2014-12-21 DIAGNOSIS — G8929 Other chronic pain: Secondary | ICD-10-CM | POA: Insufficient documentation

## 2014-12-21 DIAGNOSIS — Z880 Allergy status to penicillin: Secondary | ICD-10-CM | POA: Insufficient documentation

## 2014-12-21 DIAGNOSIS — S4992XA Unspecified injury of left shoulder and upper arm, initial encounter: Secondary | ICD-10-CM | POA: Diagnosis not present

## 2014-12-21 DIAGNOSIS — M5442 Lumbago with sciatica, left side: Secondary | ICD-10-CM

## 2014-12-21 DIAGNOSIS — E785 Hyperlipidemia, unspecified: Secondary | ICD-10-CM | POA: Diagnosis not present

## 2014-12-21 DIAGNOSIS — Z7952 Long term (current) use of systemic steroids: Secondary | ICD-10-CM | POA: Insufficient documentation

## 2014-12-21 DIAGNOSIS — Y9389 Activity, other specified: Secondary | ICD-10-CM | POA: Insufficient documentation

## 2014-12-21 DIAGNOSIS — X58XXXA Exposure to other specified factors, initial encounter: Secondary | ICD-10-CM | POA: Diagnosis not present

## 2014-12-21 DIAGNOSIS — Z79899 Other long term (current) drug therapy: Secondary | ICD-10-CM | POA: Diagnosis not present

## 2014-12-21 DIAGNOSIS — Y9289 Other specified places as the place of occurrence of the external cause: Secondary | ICD-10-CM | POA: Insufficient documentation

## 2014-12-21 DIAGNOSIS — M545 Low back pain: Secondary | ICD-10-CM | POA: Diagnosis present

## 2014-12-21 DIAGNOSIS — Y998 Other external cause status: Secondary | ICD-10-CM | POA: Diagnosis not present

## 2014-12-21 HISTORY — DX: Dorsalgia, unspecified: M54.9

## 2014-12-21 HISTORY — DX: Other chronic pain: G89.29

## 2014-12-21 MED ORDER — HYDROCODONE-ACETAMINOPHEN 5-325 MG PO TABS: 1.0000 | ORAL_TABLET | Freq: Four times a day (QID) | ORAL | Status: DC | PRN

## 2014-12-21 MED ORDER — IBUPROFEN 800 MG PO TABS: 800.0000 mg | ORAL_TABLET | Freq: Three times a day (TID) | ORAL | Status: DC

## 2014-12-21 MED ORDER — KETOROLAC TROMETHAMINE 60 MG/2ML IM SOLN
60.0000 mg | Freq: Once | INTRAMUSCULAR | Status: AC
  Administered 2014-12-21: 60 mg via INTRAMUSCULAR
  Filled 2014-12-21: qty 2

## 2014-12-21 MED ORDER — HYDROCODONE-ACETAMINOPHEN 5-325 MG PO TABS
2.0000 | ORAL_TABLET | Freq: Once | ORAL | Status: AC
  Administered 2014-12-21: 2 via ORAL
  Filled 2014-12-21: qty 2

## 2014-12-21 MED ORDER — DIAZEPAM 5 MG PO TABS
5.0000 mg | ORAL_TABLET | Freq: Once | ORAL | Status: AC
  Administered 2014-12-21: 5 mg via ORAL
  Filled 2014-12-21: qty 1
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
## 2014-12-21 NOTE — ED Notes (Signed)
Pt reports 2 weeks ago back went out.  H/o herniated disc L4-5.  Back goes out about once a year pt will then go to chiropractor and usually by 7-8 days is back to normal, for the past several days pain is radiating down right lateral and anterior leg to knee.  NO bowel/bladder problems.  Pt has been taking Meloxicam and Robaxin with no relief, last dose of Robaxin @ 4am, Meloxicam @ 1pm. Pt also reports left shoulder pain and decreased range of motion x 2 months, no known injury.

## 2014-12-21 NOTE — Discharge Instructions (Signed)
Discontinue meloxicam. Use ibuprofen 3 times daily for pain and swelling. Use hydrocodone as needed for breakthrough pain. Use Flexeril already prescribed to twice a day as needed for muscle spasms. Follow-up with your doctor on Monday, return to the emergency room with any worsening of symptoms.   Sciatica Sciatica is pain, weakness, numbness, or tingling along the path of the sciatic nerve. The nerve starts in the lower back and runs down the back of each leg. The nerve controls the muscles in the lower leg and in the back of the knee, while also providing sensation to the back of the thigh, lower leg, and the sole of your foot. Sciatica is a symptom of another medical condition. For instance, nerve damage or certain conditions, such as a herniated disk or bone spur on the spine, pinch or put pressure on the sciatic nerve. This causes the pain, weakness, or other sensations normally associated with sciatica. Generally, sciatica only affects one side of the body. CAUSES   Herniated or slipped disc.  Degenerative disk disease.  A pain disorder involving the narrow muscle in the buttocks (piriformis syndrome).  Pelvic injury or fracture.  Pregnancy.  Tumor (rare). SYMPTOMS  Symptoms can vary from mild to very severe. The symptoms usually travel from the low back to the buttocks and down the back of the leg. Symptoms can include:  Mild tingling or dull aches in the lower back, leg, or hip.  Numbness in the back of the calf or sole of the foot.  Burning sensations in the lower back, leg, or hip.  Sharp pains in the lower back, leg, or hip.  Leg weakness.  Severe back pain inhibiting movement. These symptoms may get worse with coughing, sneezing, laughing, or prolonged sitting or standing. Also, being overweight may worsen symptoms. DIAGNOSIS  Your caregiver will perform a physical exam to look for common symptoms of sciatica. He or she may ask you to do certain movements or activities  that would trigger sciatic nerve pain. Other tests may be performed to find the cause of the sciatica. These may include:  Blood tests.  X-rays.  Imaging tests, such as an MRI or CT scan. TREATMENT  Treatment is directed at the cause of the sciatic pain. Sometimes, treatment is not necessary and the pain and discomfort goes away on its own. If treatment is needed, your caregiver may suggest:  Over-the-counter medicines to relieve pain.  Prescription medicines, such as anti-inflammatory medicine, muscle relaxants, or narcotics.  Applying heat or ice to the painful area.  Steroid injections to lessen pain, irritation, and inflammation around the nerve.  Reducing activity during periods of pain.  Exercising and stretching to strengthen your abdomen and improve flexibility of your spine. Your caregiver may suggest losing weight if the extra weight makes the back pain worse.  Physical therapy.  Surgery to eliminate what is pressing or pinching the nerve, such as a bone spur or part of a herniated disk. HOME CARE INSTRUCTIONS   Only take over-the-counter or prescription medicines for pain or discomfort as directed by your caregiver.  Apply ice to the affected area for 20 minutes, 3-4 times a day for the first 48-72 hours. Then try heat in the same way.  Exercise, stretch, or perform your usual activities if these do not aggravate your pain.  Attend physical therapy sessions as directed by your caregiver.  Keep all follow-up appointments as directed by your caregiver.  Do not wear high heels or shoes that do not provide  proper support.  Check your mattress to see if it is too soft. A firm mattress may lessen your pain and discomfort. SEEK IMMEDIATE MEDICAL CARE IF:   You lose control of your bowel or bladder (incontinence).  You have increasing weakness in the lower back, pelvis, buttocks, or legs.  You have redness or swelling of your back.  You have a burning sensation when  you urinate.  You have pain that gets worse when you lie down or awakens you at night.  Your pain is worse than you have experienced in the past.  Your pain is lasting longer than 4 weeks.  You are suddenly losing weight without reason. MAKE SURE YOU:  Understand these instructions.  Will watch your condition.  Will get help right away if you are not doing well or get worse. Document Released: 06/29/2001 Document Revised: 01/04/2012 Document Reviewed: 11/14/2011 [facility] Patient Information 2015 [place], [place]. This information is not intended to replace advice given to you by your health care provider. Make sure you discuss any questions you have with your health care provider.   Back Exercises Back exercises help treat and prevent back injuries. The goal of back exercises is to increase the strength of your abdominal and back muscles and the flexibility of your back. These exercises should be started when you no longer have back pain. Back exercises include:  Pelvic Tilt. Lie on your back with your knees bent. Tilt your pelvis until the lower part of your back is against the floor. Hold this position 5 to 10 sec and repeat 5 to 10 times.  Knee to Chest. Pull first 1 knee up against your chest and hold for 20 to 30 seconds, repeat this with the other knee, and then both knees. This may be done with the other leg straight or bent, whichever feels better.  Sit-Ups or Curl-Ups. Bend your knees 90 degrees. Start with tilting your pelvis, and do a partial, slow sit-up, lifting your trunk only 30 to 45 degrees off the floor. Take at least 2 to 3 seconds for each sit-up. Do not do sit-ups with your knees out straight. If partial sit-ups are difficult, simply do the above but with only tightening your abdominal muscles and holding it as directed.  Hip-Lift. Lie on your back with your knees flexed 90 degrees. Push down with your feet and shoulders as you raise your hips a couple inches off the  floor; hold for 10 seconds, repeat 5 to 10 times.  Back arches. Lie on your stomach, propping yourself up on bent elbows. Slowly press on your hands, causing an arch in your low back. Repeat 3 to 5 times. Any initial stiffness and discomfort should lessen with repetition over time.  Shoulder-Lifts. Lie face down with arms beside your body. Keep hips and torso pressed to floor as you slowly lift your head and shoulders off the floor. Do not overdo your exercises, especially in the beginning. Exercises may cause you some mild back discomfort which lasts for a few minutes; however, if the pain is more severe, or lasts for more than 15 minutes, do not continue exercises until you see your caregiver. Improvement with exercise therapy for back problems is slow.  See your caregivers for assistance with developing a proper back exercise program. Document Released: 08/12/2004 Document Revised: 09/27/2011 Document Reviewed: 05/06/2011 [facility] Patient Information 2015 [place], [place]. This information is not intended to replace advice given to you by your health care provider. Make sure you discuss any  questions you have with your health care provider.   Acromioclavicular Injuries The AC (acromioclavicular) joint is the joint in the shoulder where the collarbone (clavicle) meets the shoulder blade (scapula). The part of the shoulder blade connected to the collarbone is called the acromion. Common problems with and treatments for the [facility] joint are detailed below. ARTHRITIS Arthritis occurs when the joint has been injured and the smooth padding between the joints (cartilage) is lost. This is the wear and tear seen in most joints of the body if they have been overused. This causes the joint to produce pain and swelling which is worse with activity.  AC JOINT SEPARATION AC joint separation means that the ligaments connecting the acromion of the shoulder blade and collarbone have been damaged, and the two bones no  longer line up. AC separations can be anywhere from mild to severe, and are "graded" depending upon which ligaments are torn and how badly they are torn.  Grade I Injury: the least damage is done, and the [facility] joint still lines up.  Grade II Injury: damage to the ligaments which reinforce the [facility] joint. In a Grade II injury, these ligaments are stretched but not entirely torn. When stressed, the [facility] joint becomes painful and unstable.  Grade III Injury: AC and secondary ligaments are completely torn, and the collarbone is no longer attached to the shoulder blade. This results in deformity; a prominence of the end of the clavicle. AC JOINT FRACTURE AC joint fracture means that there has been a break in the bones of the [facility] joint, usually the end of the clavicle. TREATMENT TREATMENT OF AC ARTHRITIS  There is currently no way to replace the cartilage damaged by arthritis. The best way to improve the condition is to decrease the activities which aggravate the problem. Application of ice to the joint helps decrease pain and soreness (inflammation). The use of non-steroidal anti-inflammatory medication is helpful.  If less conservative measures do not work, then cortisone shots (injections) may be used. These are anti-inflammatories; they decrease the soreness in the joint and swelling.  If non-surgical measures fail, surgery may be recommended. The procedure is generally removal of a portion of the end of the clavicle. This is the part of the collarbone closest to your acromion which is stabilized with ligaments to the acromion of the shoulder blade. This surgery may be performed using a tube-like instrument with a light (arthroscope) for looking into a joint. It may also be performed as an open surgery through a small incision by the surgeon. Most patients will have good range of motion within 6 weeks and may return to all activity including sports by 8-12 weeks, barring complications. TREATMENT OF AN AC  SEPARATION  The initial treatment is to decrease pain. This is best accomplished by immobilizing the arm in a sling and placing an ice pack to the shoulder for 20 to 30 minutes every 2 hours as needed. As the pain starts to subside, it is important to begin moving the fingers, wrist, elbow and eventually the shoulder in order to prevent a stiff or "frozen" shoulder. Instruction on when and how much to move the shoulder will be provided by your caregiver. The length of time needed to regain full motion and function depends on the amount or grade of the injury. Recovery from a Grade I AC separation usually takes 10 to 14 days, whereas a Grade III may take 6 to 8 weeks.  Grade I and II separations usually do not  require surgery. Even Grade III injuries usually allow return to full activity with few restrictions. Treatment is also based on the activity demands of the injured shoulder. For example, a high level quarterback with an injured throwing arm will receive more aggressive treatment than someone with a desk job who rarely uses his/her arm for strenuous activities. In some cases, a painful lump may persist which could require a later surgery. Surgery can be very successful, but the benefits must be weighed against the potential risks. TREATMENT OF AN AC JOINT FRACTURE Fracture treatment depends on the type of fracture. Sometimes a splint or sling may be all that is required. Other times surgery may be required for repair. This is more frequently the case when the ligaments supporting the clavicle are completely torn. Your caregiver will help you with these decisions and together you can decide what will be the best treatment. HOME CARE INSTRUCTIONS   Apply ice to the injury for 15-20 minutes each hour while awake for 2 days. Put the ice in a plastic bag and place a towel between the bag of ice and skin.  If a sling has been applied, wear it constantly for as long as directed by your caregiver, even at  night. The sling or splint can be removed for bathing or showering or as directed. Be sure to keep the shoulder in the same place as when the sling is on. Do not lift the arm.  If a figure-of-eight splint has been applied it should be tightened gently by another person every day. Tighten it enough to keep the shoulders held back. Allow enough room to place the index finger between the body and strap. Loosen the splint immediately if there is numbness or tingling in the hands.  Take over-the-counter or prescription medicines for pain, discomfort or fever as directed by your caregiver.  If you or your child has received a follow up appointment, it is very important to keep that appointment in order to avoid long term complications, chronic pain or disability. SEEK MEDICAL CARE IF:   The pain is not relieved with medications.  There is increased swelling or discoloration that continues to get worse rather than better.  You or your child has been unable to follow up as instructed.  There is progressive numbness and tingling in the arm, forearm or hand. SEEK IMMEDIATE MEDICAL CARE IF:   The arm is numb, cold or pale.  There is increasing pain in the hand, forearm or fingers. MAKE SURE YOU:   Understand these instructions.  Will watch your condition.  Will get help right away if you are not doing well or get worse. Document Released: 04/14/2005 Document Revised: 09/27/2011 Document Reviewed: 10/07/2008 [facility] Patient Information 2015 [place]. This information is not intended to replace advice given to you by your health care provider. Make sure you discuss any questions you have with your health care provider.

## 2014-12-24 ENCOUNTER — Encounter: Payer: Self-pay | Admitting: Internal Medicine

## 2014-12-24 ENCOUNTER — Ambulatory Visit (INDEPENDENT_AMBULATORY_CARE_PROVIDER_SITE_OTHER): Payer: 59 | Admitting: Internal Medicine

## 2014-12-24 VITALS — BP 138/82 | HR 84 | Temp 97.8°F | Resp 16 | Ht 67.0 in | Wt 195.5 lb

## 2014-12-24 DIAGNOSIS — G8929 Other chronic pain: Secondary | ICD-10-CM

## 2014-12-24 DIAGNOSIS — M549 Dorsalgia, unspecified: Secondary | ICD-10-CM | POA: Diagnosis not present

## 2014-12-24 NOTE — Progress Notes (Signed)
Subjective:  Patient ID: Wayne ConnorsMichael Bignell, male    DOB: 03/09/1971  Age: 44 y.o. MRN: [id number]  CC: Back Pain   HPI Wayne ConnorsMichael Lish presents for follow up regarding low back pain, he has had recurrent episodes like this for many years. The current episode started about 2 weeks ago with sharp pain that radiated into his RLE, he was seen in the ER and by a chiropractor and is getting better, there is no more radiation of the pain. He denies any N/W/T and now has an occasional throbbing pain and spasm that resolves after a few doses of motrin, he does not take the vicodin.  Outpatient Prescriptions Prior to Visit  Medication Sig Dispense Refill  . ibuprofen (ADVIL,MOTRIN) 800 MG tablet Take 1 tablet (800 mg total) by mouth 3 (three) times daily. 21 tablet 0  . nebivolol (BYSTOLIC) 10 MG tablet Take 1 tablet (10 mg total) by mouth daily. 90 tablet 3  . HYDROcodone-acetaminophen (NORCO/VICODIN) 5-325 MG per tablet Take 1-2 tablets by mouth every 6 (six) hours as needed. 12 tablet 0  . ezetimibe (ZETIA) 10 MG tablet Take 1 tablet (10 mg total) by mouth daily. (Patient not taking: Reported on 12/24/2014) 90 tablet 3  . fluocinonide (LIDEX) 0.05 % external solution Apply 1 application topically 2 (two) times daily. (Patient not taking: Reported on 12/24/2014) 60 mL 1  . rosuvastatin (CRESTOR) 20 MG tablet Take 1 tablet (20 mg total) by mouth daily. (Patient not taking: Reported on 12/24/2014) 90 tablet 1  . scopolamine (TRANSDERM-SCOP) 1.5 MG Place 1 patch (1.5 mg total) onto the skin every 3 (three) days. (Patient not taking: Reported on 12/24/2014) 4 patch 1   No facility-administered medications prior to visit.    ROS Review of Systems  Constitutional: Negative.  Negative for fever, chills, diaphoresis, appetite change and fatigue.  HENT: Negative.   Eyes: Negative.   Respiratory: Negative.  Negative for cough, choking, chest tightness, shortness of breath and stridor.   Cardiovascular: Negative.   Negative for chest pain, palpitations and leg swelling.  Gastrointestinal: Negative.  Negative for abdominal pain.  Endocrine: Negative.   Genitourinary: Negative.   Musculoskeletal: Positive for back pain. Negative for myalgias, joint swelling, arthralgias, gait problem, neck pain and neck stiffness.  Skin: Negative.   Allergic/Immunologic: Negative.   Neurological: Negative.  Negative for dizziness, syncope, speech difficulty, weakness, light-headedness, numbness and headaches.  Hematological: Negative.  Negative for adenopathy. Does not bruise/bleed easily.  Psychiatric/Behavioral: Negative.     Objective:  BP 138/82 mmHg  Pulse 84  Temp(Src) 97.8 F (36.6 C) (Oral)  Resp 16  Ht 5\' 7"  (1.702 m)  Wt 195 lb 8 oz (88.678 kg)  BMI 30.61 kg/m2  SpO2 97%  BP Readings from Last 3 Encounters:  12/24/14 138/82  12/21/14 141/93  07/08/14 148/96    Wt Readings from Last 3 Encounters:  12/24/14 195 lb 8 oz (88.678 kg)  12/21/14 191 lb 11.2 oz (86.955 kg)  07/08/14 194 lb (87.998 kg)    Physical Exam  Constitutional: He is oriented to person, place, and time. He appears well-developed and well-nourished. No distress.  HENT:  Head: Normocephalic and atraumatic.  Mouth/Throat: Oropharynx is clear and moist. No oropharyngeal exudate.  Eyes: Conjunctivae are normal. Right eye exhibits no discharge. Left eye exhibits no discharge. No scleral icterus.  Neck: Normal range of motion. Neck supple. No JVD present. No tracheal deviation present. No thyromegaly present.  Cardiovascular: Normal rate, regular rhythm, normal heart sounds and  intact distal pulses.  Exam reveals no gallop and no friction rub.   No murmur heard. Pulmonary/Chest: Effort normal and breath sounds normal. No stridor. No respiratory distress. He has no wheezes. He has no rales. He exhibits no tenderness.  Abdominal: Soft. Bowel sounds are normal. He exhibits no distension and no mass. There is no tenderness. There is no  rebound and no guarding.  Musculoskeletal: Normal range of motion. He exhibits no edema or tenderness.       Right shoulder: Normal.       Left shoulder: Normal.       Lumbar back: Normal. He exhibits normal range of motion, no tenderness, no bony tenderness, no swelling, no edema, no deformity, no laceration, no pain, no spasm and normal pulse.  Lymphadenopathy:    He has no cervical adenopathy.  Neurological: He is alert and oriented to person, place, and time. He has normal strength. He displays no atrophy, no tremor and normal reflexes. No cranial nerve deficit or sensory deficit. He exhibits normal muscle tone. He displays a negative Romberg sign. He displays no seizure activity. Coordination and gait normal.  Reflex Scores:      Tricep reflexes are 1+ on the right side and 1+ on the left side.      Bicep reflexes are 1+ on the right side and 1+ on the left side.      Brachioradialis reflexes are 1+ on the right side and 1+ on the left side.      Patellar reflexes are 1+ on the right side and 1+ on the left side.      Achilles reflexes are 1+ on the right side and 1+ on the left side. Neg SLR in BLE  Skin: Skin is warm and dry. No rash noted. He is not diaphoretic. No erythema. No pallor.  Psychiatric: He has a normal mood and affect. His behavior is normal. Judgment and thought content normal.  Vitals reviewed.   Lab Results  Component Value Date   WBC 8.1 07/08/2014   HGB 16.3 07/08/2014   HCT 48.8 07/08/2014   PLT 187.0 07/08/2014   GLUCOSE 113* 07/08/2014   CHOL 277* 07/08/2014   TRIG 423.0* 07/08/2014   HDL 28.20* 07/08/2014   LDLDIRECT 138.7 07/08/2014   ALT 32 07/08/2014   AST 23 07/08/2014   NA 137 07/08/2014   K 4.0 07/08/2014   CL 103 07/08/2014   CREATININE 1.2 07/08/2014   BUN 16 07/08/2014   CO2 26 07/08/2014   TSH 0.59 07/08/2014    Dg Lumbar Spine Complete  12/21/2014   CLINICAL DATA:  Back and shoulder pain for 2 weeks.  EXAM: LUMBAR SPINE - COMPLETE 4+  VIEW; LEFT SHOULDER - 2+ VIEW  COMPARISON:  None.  FINDINGS: Lumbar spine: Normal alignment of the lumbar vertebral bodies. Disc spaces and vertebral bodies are maintained. The facets are normally aligned. No pars defects. The visualized bony pelvis is intact.  Left shoulder: The glenohumeral joint is normal. There is mild elevation of the distal clavicle in relation to the acromion. Could not exclude a mild AC joint separation.  IMPRESSION: Normal lumbar spine series.  Possible mild left AC joint separation.   Electronically Signed   By: Rudie Meyer M.D.   On: 12/21/2014 19:15   Dg Shoulder Left  12/21/2014   CLINICAL DATA:  Back and shoulder pain for 2 weeks.  EXAM: LUMBAR SPINE - COMPLETE 4+ VIEW; LEFT SHOULDER - 2+ VIEW  COMPARISON:  None.  FINDINGS:  Lumbar spine: Normal alignment of the lumbar vertebral bodies. Disc spaces and vertebral bodies are maintained. The facets are normally aligned. No pars defects. The visualized bony pelvis is intact.  Left shoulder: The glenohumeral joint is normal. There is mild elevation of the distal clavicle in relation to the acromion. Could not exclude a mild AC joint separation.  IMPRESSION: Normal lumbar spine series.  Possible mild left AC joint separation.   Electronically Signed   By: Rudie Meyer M.D.   On: 12/21/2014 19:15    Assessment & Plan:   Astor was seen today for back pain.  Diagnoses and all orders for this visit:  Chronic back pain - he may have a disc herniation but the pain is resolving and there are no neuro s/s at this time, will cont the motrin, he does not want to do an MRI at this time to see if there is a disc herniation, he is ready to return to work.   I have discontinued Mr. Desa fluocinonide, scopolamine, rosuvastatin, ezetimibe, and HYDROcodone-acetaminophen. I am also having him maintain his nebivolol and ibuprofen.  No orders of the defined types were placed in this encounter.     Follow-up: Return in about 6 weeks  (around 02/04/2015).  Sanda Linger, MD

## 2014-12-24 NOTE — Patient Instructions (Signed)
Back Pain, Adult Low back pain is very common. About 1 in 5 people have back pain.The cause of low back pain is rarely dangerous. The pain often gets better over time.About half of people with a sudden onset of back pain feel better in just 2 weeks. About 8 in 10 people feel better by 6 weeks.  CAUSES Some common causes of back pain include:  Strain of the muscles or ligaments supporting the spine.  Wear and tear (degeneration) of the spinal discs.  Arthritis.  Direct injury to the back. DIAGNOSIS Most of the time, the direct cause of low back pain is not known.However, back pain can be treated effectively even when the exact cause of the pain is unknown.Answering your caregiver's questions about your overall health and symptoms is one of the most accurate ways to make sure the cause of your pain is not dangerous. If your caregiver needs more information, he or she may order lab work or imaging tests (X-rays or MRIs).However, even if imaging tests show changes in your back, this usually does not require surgery. HOME CARE INSTRUCTIONS For many people, back pain returns.Since low back pain is rarely dangerous, it is often a condition that people can learn to manageon their own.   Remain active. It is stressful on the back to sit or stand in one place. Do not sit, drive, or stand in one place for more than 30 minutes at a time. Take short walks on level surfaces as soon as pain allows.Try to increase the length of time you walk each day.  Do not stay in bed.Resting more than 1 or 2 days can delay your recovery.  Do not avoid exercise or work.Your body is made to move.It is not dangerous to be active, even though your back may hurt.Your back will likely heal faster if you return to being active before your pain is gone.  Pay attention to your body when you bend and lift. Many people have less discomfortwhen lifting if they bend their knees, keep the load close to their bodies,and  avoid twisting. Often, the most comfortable positions are those that put less stress on your recovering back.  Find a comfortable position to sleep. Use a firm mattress and lie on your side with your knees slightly bent. If you lie on your back, put a pillow under your knees.  Only take over-the-counter or prescription medicines as directed by your caregiver. Over-the-counter medicines to reduce pain and inflammation are often the most helpful.Your caregiver may prescribe muscle relaxant drugs.These medicines help dull your pain so you can more quickly return to your normal activities and healthy exercise.  Put ice on the injured area.  Put ice in a plastic bag.  Place a towel between your skin and the bag.  Leave the ice on for 15-20 minutes, 03-04 times a day for the first 2 to 3 days. After that, ice and heat may be alternated to reduce pain and spasms.  Ask your caregiver about trying back exercises and gentle massage. This may be of some benefit.  Avoid feeling anxious or stressed.Stress increases muscle tension and can worsen back pain.It is important to recognize when you are anxious or stressed and learn ways to manage it.Exercise is a great option. SEEK MEDICAL CARE IF:  You have pain that is not relieved with rest or medicine.  You have pain that does not improve in 1 week.  You have new symptoms.  You are generally not feeling well. SEEK   IMMEDIATE MEDICAL CARE IF:   You have pain that radiates from your back into your legs.  You develop new bowel or bladder control problems.  You have unusual weakness or numbness in your arms or legs.  You develop nausea or vomiting.  You develop abdominal pain.  You feel faint. Document Released: 07/05/2005 Document Revised: 01/04/2012 Document Reviewed: 11/06/2013 Exit[employer] Patient Information 2015 [employer]. This information is not intended to replace advice given to you by your health care provider. Make sure you  discuss any questions you have with your health care provider.  

## 2014-12-24 NOTE — Progress Notes (Signed)
Pre visit review using our clinic review tool, if applicable. No additional management support is needed unless otherwise documented below in the visit note. 

## 2015-02-18 ENCOUNTER — Telehealth: Payer: Self-pay | Admitting: Internal Medicine

## 2015-02-18 DIAGNOSIS — E785 Hyperlipidemia, unspecified: Secondary | ICD-10-CM

## 2015-02-18 MED ORDER — SCOPOLAMINE 1 MG/3DAYS TD PT72: 1.0000 | MEDICATED_PATCH | TRANSDERMAL | Status: DC

## 2015-02-18 MED ORDER — ROSUVASTATIN CALCIUM 20 MG PO TABS: 20.0000 mg | ORAL_TABLET | Freq: Every day | ORAL | Status: DC

## 2015-02-18 NOTE — Telephone Encounter (Signed)
Patient is also requesting refill on crestor.

## 2015-02-18 NOTE — Telephone Encounter (Signed)
Spoke with patient, crestor and pathces has now been approved.

## 2015-02-18 NOTE — Telephone Encounter (Signed)
Returned call back to patient needing to clarify if he has restarted Crestor. According to 12/24/14 office visit, medication was d/c due to patient stating that he no loner takes.

## 2015-02-18 NOTE — Telephone Encounter (Signed)
Patient is requesting a script for motion sickness to be sent to [facility] on [address].  Patient will be going on a fishing trip soon.

## 2015-02-18 NOTE — Addendum Note (Signed)
Addended by: Rock Nephew T on: 02/18/2015 03:23 PM   Modules accepted: Orders, Medications

## 2016-04-17 ENCOUNTER — Other Ambulatory Visit: Payer: Self-pay | Admitting: Internal Medicine

## 2016-04-17 DIAGNOSIS — E785 Hyperlipidemia, unspecified: Secondary | ICD-10-CM

## 2016-04-26 ENCOUNTER — Other Ambulatory Visit: Payer: Self-pay | Admitting: Internal Medicine

## 2016-04-26 DIAGNOSIS — E785 Hyperlipidemia, unspecified: Secondary | ICD-10-CM

## 2016-07-21 DIAGNOSIS — M5116 Intervertebral disc disorders with radiculopathy, lumbar region: Secondary | ICD-10-CM | POA: Diagnosis not present

## 2016-07-21 DIAGNOSIS — M9903 Segmental and somatic dysfunction of lumbar region: Secondary | ICD-10-CM | POA: Diagnosis not present

## 2016-07-22 ENCOUNTER — Encounter: Payer: Self-pay | Admitting: Internal Medicine

## 2016-07-22 ENCOUNTER — Ambulatory Visit (INDEPENDENT_AMBULATORY_CARE_PROVIDER_SITE_OTHER): Payer: 59 | Admitting: Internal Medicine

## 2016-07-22 VITALS — BP 140/80 | HR 88 | Temp 98.6°F | Resp 20 | Wt 199.0 lb

## 2016-07-22 DIAGNOSIS — E785 Hyperlipidemia, unspecified: Secondary | ICD-10-CM | POA: Diagnosis not present

## 2016-07-22 DIAGNOSIS — M5416 Radiculopathy, lumbar region: Secondary | ICD-10-CM

## 2016-07-22 DIAGNOSIS — I10 Essential (primary) hypertension: Secondary | ICD-10-CM

## 2016-07-22 DIAGNOSIS — M5116 Intervertebral disc disorders with radiculopathy, lumbar region: Secondary | ICD-10-CM | POA: Diagnosis not present

## 2016-07-22 DIAGNOSIS — M9903 Segmental and somatic dysfunction of lumbar region: Secondary | ICD-10-CM | POA: Diagnosis not present

## 2016-07-22 MED ORDER — GABAPENTIN 100 MG PO CAPS: 100.0000 mg | ORAL_CAPSULE | Freq: Three times a day (TID) | ORAL | 3 refills | Status: DC

## 2016-07-22 MED ORDER — CYCLOBENZAPRINE HCL 5 MG PO TABS: 5.0000 mg | ORAL_TABLET | Freq: Three times a day (TID) | ORAL | 1 refills | Status: DC | PRN

## 2016-07-22 MED ORDER — PREDNISONE 10 MG PO TABS: ORAL_TABLET | ORAL | 0 refills | Status: DC

## 2016-07-22 MED ORDER — ROSUVASTATIN CALCIUM 20 MG PO TABS: 20.0000 mg | ORAL_TABLET | Freq: Every day | ORAL | 1 refills | Status: DC

## 2016-07-22 NOTE — Assessment & Plan Note (Signed)
New worsening on top of chronic recurring, now with neuro changes by exam, for MRI LS spine, and empiric predpac asd, flexeril prn, and gabapentin 100 tid prn, ok otherwise to cont the ibuprofen/pt declines other med

## 2016-07-22 NOTE — Assessment & Plan Note (Signed)
stable overall by history and exam, recent data reviewed with pt, and pt to continue medical treatment as before,  to f/u any worsening symptoms or concerns Lab Results  Component Value Date   CHOL 277 (H) 07/08/2014   HDL 28.20 (L) 07/08/2014   LDLDIRECT 138.7 07/08/2014   TRIG 423.0 (H) 07/08/2014   CHOLHDL 10 07/08/2014   Pt for refill crestor, but also to f/u with Dr Yetta BarreJones 3 months for f/u, with labs done at work next month

## 2016-07-22 NOTE — Progress Notes (Signed)
Subjective:    Patient ID: Wayne Morales, male    DOB: 03/01/1971, 46 y.o.   MRN: [id number]  HPI  Here to f/u, with c/o 2 days severe worsening left LBP with radiation to the calf and LLE weakness, .but Pt denies bowel or bladder change, fever, wt loss,  or falls. Has been stooped over yesterday, saw chiropracter with some improvement today.  Due to persistent pain and weakness, he is here today.  Has had recurring pain in the past since late teens, and active as firefighter, but this pain seems different.  Not reallyt better with heating pad or ice.  Also taking ibuprofen. Pain worse to stand and walk and less so to sit, but better to lie down.   Pt denies chest pain, increased sob or doe, wheezing, orthopnea, PND, increased LE swelling, palpitations, dizziness or syncope.  Pt denies new neurological symptoms such as new headache, or facial or extremity weakness or numbness other than above.   Pt denies polydipsia, polyuria. Denies worsening reflux, abd pain, dysphagia, n/v, bowel change or blood. Denies urinary symptoms such as dysuria, frequency, urgency, flank pain, hematuria or n/v, fever, chills. No other new history  Has been out of crestor for 6 months, wants to restart and f/u with Dr Yetta BarreJones Past Medical History:  Diagnosis Date  . Chronic back pain   . Hyperlipidemia   . Scalp psoriasis 12/15/2012   Past Surgical History:  Procedure Laterality Date  . HERNIA REPAIR     when was a child    reports that he has never smoked. He has never used smokeless tobacco. He reports that he does not drink alcohol or use drugs. family history includes Diabetes in his other; Hyperlipidemia in his mother and other. Allergies  Allergen Reactions  . Penicillins     REACTION: Rash   Current Outpatient Prescriptions on File Prior to Visit  Medication Sig Dispense Refill  . ibuprofen (ADVIL,MOTRIN) 800 MG tablet Take 1 tablet (800 mg total) by mouth 3 (three) times daily. 21 tablet 0  . nebivolol  (BYSTOLIC) 10 MG tablet Take 1 tablet (10 mg total) by mouth daily. 90 tablet 3  . scopolamine (TRANSDERM-SCOP, 1.5 MG,) 1 MG/3DAYS Place 1 patch (1.5 mg total) onto the skin every 3 (three) days. 4 patch 1   No current facility-administered medications on file prior to visit.    Review of Systems  Constitutional: Negative for unusual diaphoresis or night sweats HENT: Negative for ear swelling or discharge Eyes: Negative for worsening visual haziness  Respiratory: Negative for choking and stridor.   Gastrointestinal: Negative for distension or worsening eructation Genitourinary: Negative for retention or change in urine volume.  Musculoskeletal: Negative for other MSK pain or swelling Skin: Negative for color change and worsening wound Neurological: Negative for tremors and numbness other than noted  Psychiatric/Behavioral: Negative for decreased concentration or agitation other than above   All other system neg    Objective:   Physical Exam BP 140/80   Pulse 88   Temp 98.6 F (37 C) (Oral)   Resp 20   Wt 199 lb (90.3 kg)   SpO2 99%   BMI 31.17 kg/m  VS noted, non toxic Constitutional: Pt appears in no apparent distress HENT: Head: NCAT.  Right Ear: External ear normal.  Left Ear: External ear normal.  Eyes: . Pupils are equal, round, and reactive to light. Conjunctivae and EOM are normal Neck: Normal range of motion. Neck supple.  Cardiovascular: Normal rate and regular  rhythm.   Pulmonary/Chest: Effort normal and breath sounds without rales or wheezing.  Abd:  Soft, NT, ND, + BS MSK: spine nontender throughout, pain primarily starting at low lumbar/high sacral with tenderness left buttock without swelling or rash Neurological: Pt is alert. Not confused , motor 5/5 intact to extremities except for LLE 4+ /5 weakness, sens some diminished to distal LLE Skin: Skin is warm. No rash, no LE edema Psychiatric: Pt behavior is normal. No agitation.  No other new exam  findings  EXAM: LUMBAR SPINE - COMPLETE 4+ VIEW; LEFT SHOULDER - 2+ VIEW 12/21/2014 IMPRESSION: Normal lumbar spine series.    Assessment & Plan:

## 2016-07-22 NOTE — Assessment & Plan Note (Signed)
stable overall by history and exam, recent data reviewed with pt, and pt to continue medical treatment as before,  to f/u any worsening symptoms or concerns BP Readings from Last 3 Encounters:  07/22/16 140/80  12/24/14 138/82  12/21/14 141/93

## 2016-07-22 NOTE — Progress Notes (Signed)
Pre visit review using our clinic review tool, if applicable. No additional management support is needed unless otherwise documented below in the visit note. 

## 2016-07-22 NOTE — Patient Instructions (Addendum)
Please take all new medication as prescribed - the gabapentin for nerve pain, prednisone for anti-inflammation, and flexeril as needed for muscle relaxer  Please continue all other medications as before, including the ibuprofen  Please have the pharmacy call with any other refills you may need.  Please keep your appointments with your specialists as you may have planned  You will be contacted regarding the referral for: MRI for the lower back [facility]  Please return in 3 months to Dr Yetta BarreJones for Physical, or sooner if needed

## 2016-07-23 DIAGNOSIS — M9903 Segmental and somatic dysfunction of lumbar region: Secondary | ICD-10-CM | POA: Diagnosis not present

## 2016-07-23 DIAGNOSIS — M5116 Intervertebral disc disorders with radiculopathy, lumbar region: Secondary | ICD-10-CM | POA: Diagnosis not present

## 2016-07-25 ENCOUNTER — Ambulatory Visit
Admission: RE | Admit: 2016-07-25 | Discharge: 2016-07-25 | Disposition: A | Discharge status: Home / Self Care | Payer: 59 | Source: Ambulatory Visit | Attending: Internal Medicine | Admitting: Internal Medicine

## 2016-07-25 DIAGNOSIS — M5126 Other intervertebral disc displacement, lumbar region: Secondary | ICD-10-CM | POA: Diagnosis not present

## 2016-07-25 DIAGNOSIS — M5416 Radiculopathy, lumbar region: Secondary | ICD-10-CM

## 2016-07-26 ENCOUNTER — Telehealth: Payer: Self-pay | Admitting: Internal Medicine

## 2016-07-26 DIAGNOSIS — M5116 Intervertebral disc disorders with radiculopathy, lumbar region: Secondary | ICD-10-CM | POA: Diagnosis not present

## 2016-07-26 DIAGNOSIS — M9903 Segmental and somatic dysfunction of lumbar region: Secondary | ICD-10-CM | POA: Diagnosis not present

## 2016-07-26 NOTE — Telephone Encounter (Signed)
IMPRESSION: 1. L4-5 small left paracentral disc protrusion with minimal mass effect on the descending L5 nerve. 2. L5-S1 small noncompressive right paracentral disc protrusion  2 protruding discs

## 2016-07-26 NOTE — Telephone Encounter (Signed)
Pt called in and would like nurse to call with MRI results as soon has possible

## 2016-07-27 ENCOUNTER — Telehealth: Payer: Self-pay | Admitting: *Deleted

## 2016-07-27 ENCOUNTER — Other Ambulatory Visit: Payer: Self-pay | Admitting: Internal Medicine

## 2016-07-27 DIAGNOSIS — M5116 Intervertebral disc disorders with radiculopathy, lumbar region: Secondary | ICD-10-CM | POA: Diagnosis not present

## 2016-07-27 DIAGNOSIS — M545 Low back pain: Secondary | ICD-10-CM

## 2016-07-27 DIAGNOSIS — E785 Hyperlipidemia, unspecified: Secondary | ICD-10-CM

## 2016-07-27 DIAGNOSIS — M9903 Segmental and somatic dysfunction of lumbar region: Secondary | ICD-10-CM | POA: Diagnosis not present

## 2016-07-27 MED ORDER — ROSUVASTATIN CALCIUM 20 MG PO TABS: 20.0000 mg | ORAL_TABLET | Freq: Every day | ORAL | 0 refills | Status: DC

## 2016-07-27 NOTE — Telephone Encounter (Signed)
Pt left msg on triage stating saw Dr. Jonny RuizJohn yesterday he was suppose to send refills on his crestor, but pharmacy didn't have. Per notes MD ok refill but need to f/u w/Dr. Yetta BarreJones in 3 months. Will send [facility] day to [facility]...Raechel Chute/lmb

## 2016-07-29 DIAGNOSIS — M9903 Segmental and somatic dysfunction of lumbar region: Secondary | ICD-10-CM | POA: Diagnosis not present

## 2016-07-29 DIAGNOSIS — M5116 Intervertebral disc disorders with radiculopathy, lumbar region: Secondary | ICD-10-CM | POA: Diagnosis not present

## 2016-08-02 DIAGNOSIS — M9903 Segmental and somatic dysfunction of lumbar region: Secondary | ICD-10-CM | POA: Diagnosis not present

## 2016-08-02 DIAGNOSIS — M5116 Intervertebral disc disorders with radiculopathy, lumbar region: Secondary | ICD-10-CM | POA: Diagnosis not present

## 2016-08-06 DIAGNOSIS — M9903 Segmental and somatic dysfunction of lumbar region: Secondary | ICD-10-CM | POA: Diagnosis not present

## 2016-08-06 DIAGNOSIS — M5106 Intervertebral disc disorders with myelopathy, lumbar region: Secondary | ICD-10-CM | POA: Diagnosis not present

## 2016-08-06 DIAGNOSIS — M5116 Intervertebral disc disorders with radiculopathy, lumbar region: Secondary | ICD-10-CM | POA: Diagnosis not present

## 2016-08-10 DIAGNOSIS — M9903 Segmental and somatic dysfunction of lumbar region: Secondary | ICD-10-CM | POA: Diagnosis not present

## 2016-08-10 DIAGNOSIS — M5116 Intervertebral disc disorders with radiculopathy, lumbar region: Secondary | ICD-10-CM | POA: Diagnosis not present

## 2016-08-12 DIAGNOSIS — M5116 Intervertebral disc disorders with radiculopathy, lumbar region: Secondary | ICD-10-CM | POA: Diagnosis not present

## 2016-08-12 DIAGNOSIS — M9903 Segmental and somatic dysfunction of lumbar region: Secondary | ICD-10-CM | POA: Diagnosis not present

## 2016-08-17 DIAGNOSIS — M9903 Segmental and somatic dysfunction of lumbar region: Secondary | ICD-10-CM | POA: Diagnosis not present

## 2016-08-17 DIAGNOSIS — M5116 Intervertebral disc disorders with radiculopathy, lumbar region: Secondary | ICD-10-CM | POA: Diagnosis not present

## 2016-08-20 DIAGNOSIS — R030 Elevated blood-pressure reading, without diagnosis of hypertension: Secondary | ICD-10-CM | POA: Diagnosis not present

## 2016-08-20 DIAGNOSIS — M545 Low back pain: Secondary | ICD-10-CM | POA: Diagnosis not present

## 2016-10-20 ENCOUNTER — Ambulatory Visit: Payer: 59 | Admitting: Internal Medicine

## 2016-11-11 ENCOUNTER — Ambulatory Visit: Payer: 59 | Admitting: Internal Medicine

## 2016-12-08 ENCOUNTER — Telehealth: Payer: Self-pay | Admitting: *Deleted

## 2016-12-08 ENCOUNTER — Other Ambulatory Visit: Payer: Self-pay | Admitting: Internal Medicine

## 2016-12-08 MED ORDER — SCOPOLAMINE 1 MG/3DAYS TD PT72: 1.0000 | MEDICATED_PATCH | TRANSDERMAL | 1 refills | Status: DC

## 2016-12-08 NOTE — Telephone Encounter (Signed)
Left msg on triage stating needing to get a refill on the Transderm-scop patches he is going deep sea fishing this weekend...Raechel Chute/lmb

## 2016-12-09 NOTE — Telephone Encounter (Signed)
MD sent refill to [facility]...Raechel Chute/lmb

## 2016-12-23 DIAGNOSIS — L508 Other urticaria: Secondary | ICD-10-CM | POA: Diagnosis not present

## 2017-05-27 ENCOUNTER — Other Ambulatory Visit: Payer: Self-pay | Admitting: Internal Medicine

## 2017-05-27 ENCOUNTER — Telehealth: Payer: Self-pay | Admitting: Internal Medicine

## 2017-05-27 DIAGNOSIS — E785 Hyperlipidemia, unspecified: Secondary | ICD-10-CM

## 2017-05-27 NOTE — Telephone Encounter (Signed)
scopolamine (TRANSDERM-SCOP, 1.5 MG,) 1 MG/3DAYS   [facility] [id number] [place] [id number] [facility] [address] [facility] [phone] (Phone) [phone])   Patient is requesting a refill. He states there is no more refills at the pharmacy.

## 2017-08-17 ENCOUNTER — Other Ambulatory Visit: Payer: Self-pay | Admitting: Internal Medicine

## 2017-08-17 DIAGNOSIS — E785 Hyperlipidemia, unspecified: Secondary | ICD-10-CM

## 2017-08-18 NOTE — Telephone Encounter (Signed)
Pt needs an appt before I can send this in. Please let me know when he has scheduled and I will send in.

## 2017-08-19 NOTE — Telephone Encounter (Signed)
LM for pt to call back to schedule appointment.

## 2017-08-24 NOTE — Telephone Encounter (Signed)
Per office policy sent 30 day to local pharmacy until appt.../lmb  

## 2017-08-24 NOTE — Telephone Encounter (Signed)
Can you try pt again please?

## 2017-08-24 NOTE — Telephone Encounter (Signed)
Appointment scheduled for Wednesday, 09/07/2017.

## 2017-09-07 ENCOUNTER — Other Ambulatory Visit (INDEPENDENT_AMBULATORY_CARE_PROVIDER_SITE_OTHER): Payer: 59

## 2017-09-07 ENCOUNTER — Ambulatory Visit: Payer: 59 | Admitting: Internal Medicine

## 2017-09-07 ENCOUNTER — Encounter: Payer: Self-pay | Admitting: Internal Medicine

## 2017-09-07 VITALS — BP 138/100 | HR 74 | Temp 97.5°F | Resp 16 | Ht 67.0 in | Wt 201.8 lb

## 2017-09-07 DIAGNOSIS — R739 Hyperglycemia, unspecified: Secondary | ICD-10-CM | POA: Insufficient documentation

## 2017-09-07 DIAGNOSIS — E785 Hyperlipidemia, unspecified: Secondary | ICD-10-CM

## 2017-09-07 DIAGNOSIS — I10 Essential (primary) hypertension: Secondary | ICD-10-CM

## 2017-09-07 DIAGNOSIS — M5126 Other intervertebral disc displacement, lumbar region: Secondary | ICD-10-CM | POA: Diagnosis not present

## 2017-09-07 DIAGNOSIS — Z0000 Encounter for general adult medical examination without abnormal findings: Secondary | ICD-10-CM

## 2017-09-07 DIAGNOSIS — B351 Tinea unguium: Secondary | ICD-10-CM | POA: Diagnosis not present

## 2017-09-07 DIAGNOSIS — E781 Pure hyperglyceridemia: Secondary | ICD-10-CM | POA: Diagnosis not present

## 2017-09-07 LAB — LIPID PANEL
CHOLESTEROL: 208 mg/dL — AB (ref 0–200)
HDL: 32.5 mg/dL — AB (ref >39.00)
NonHDL: 175.85
TRIGLYCERIDES: 314 mg/dL — AB (ref 0.0–149.0)
Total CHOL/HDL Ratio: 6
VLDL: 62.8 mg/dL — AB (ref 0.0–40.0)

## 2017-09-07 LAB — URINALYSIS, ROUTINE W REFLEX MICROSCOPIC
Bilirubin Urine: NEGATIVE
Hgb urine dipstick: NEGATIVE
Ketones, ur: NEGATIVE
Leukocytes, UA: NEGATIVE
Nitrite: NEGATIVE
PH: 6 (ref 5.0–8.0)
RBC / HPF: NONE SEEN (ref 0–?)
Specific Gravity, Urine: 1.025 (ref 1.000–1.030)
Total Protein, Urine: NEGATIVE
Urine Glucose: NEGATIVE
Urobilinogen, UA: 0.2 (ref 0.0–1.0)
WBC, UA: NONE SEEN (ref 0–?)

## 2017-09-07 LAB — CBC WITH DIFFERENTIAL/PLATELET
BASOS PCT: 0.6 % (ref 0.0–3.0)
Basophils Absolute: 0.1 10*3/uL (ref 0.0–0.1)
EOS ABS: 0.2 10*3/uL (ref 0.0–0.7)
Eosinophils Relative: 2 % (ref 0.0–5.0)
HCT: 46.9 % (ref 39.0–52.0)
Hemoglobin: 16.1 g/dL (ref 13.0–17.0)
LYMPHS ABS: 2.5 10*3/uL (ref 0.7–4.0)
Lymphocytes Relative: 29.9 % (ref 12.0–46.0)
MCHC: 34.4 g/dL (ref 30.0–36.0)
MCV: 93.7 fl (ref 78.0–100.0)
MONO ABS: 0.6 10*3/uL (ref 0.1–1.0)
Monocytes Relative: 7.8 % (ref 3.0–12.0)
NEUTROS ABS: 5 10*3/uL (ref 1.4–7.7)
NEUTROS PCT: 59.7 % (ref 43.0–77.0)
PLATELETS: 190 10*3/uL (ref 150.0–400.0)
RBC: 5 Mil/uL (ref 4.22–5.81)
RDW: 13 % (ref 11.5–15.5)
WBC: 8.3 10*3/uL (ref 4.0–10.5)

## 2017-09-07 LAB — COMPREHENSIVE METABOLIC PANEL
ALT: 33 U/L (ref 0–53)
AST: 21 U/L (ref 0–37)
Albumin: 4.5 g/dL (ref 3.5–5.2)
Alkaline Phosphatase: 88 U/L (ref 39–117)
BUN: 11 mg/dL (ref 6–23)
CALCIUM: 10 mg/dL (ref 8.4–10.5)
CO2: 31 meq/L (ref 19–32)
CREATININE: 1.16 mg/dL (ref 0.40–1.50)
Chloride: 104 mEq/L (ref 96–112)
GFR: 71.92 mL/min (ref >60.00)
GLUCOSE: 94 mg/dL (ref 70–99)
Potassium: 4.1 mEq/L (ref 3.5–5.1)
Sodium: 141 mEq/L (ref 135–145)
TOTAL PROTEIN: 7.6 g/dL (ref 6.0–8.3)
Total Bilirubin: 0.7 mg/dL (ref 0.2–1.2)

## 2017-09-07 LAB — LDL CHOLESTEROL, DIRECT: Direct LDL: 87 mg/dL

## 2017-09-07 LAB — TSH: TSH: 0.67 u[IU]/mL (ref 0.35–4.50)

## 2017-09-07 LAB — HEMOGLOBIN A1C: Hgb A1c MFr Bld: 5.1 % (ref 4.6–6.5)

## 2017-09-07 LAB — PSA: PSA: 0.56 ng/mL (ref 0.10–4.00)

## 2017-09-07 MED ORDER — ROSUVASTATIN CALCIUM 20 MG PO TABS: 20.0000 mg | ORAL_TABLET | Freq: Every day | ORAL | 1 refills | Status: DC

## 2017-09-07 MED ORDER — NEBIVOLOL HCL 5 MG PO TABS: 5.0000 mg | ORAL_TABLET | Freq: Every day | ORAL | 0 refills | Status: DC

## 2017-09-07 MED ORDER — EFINACONAZOLE 10 % EX SOLN: 1.0000 | Freq: Every morning | CUTANEOUS | 1 refills | Status: DC

## 2017-09-07 NOTE — Patient Instructions (Signed)

## 2017-09-07 NOTE — Progress Notes (Signed)
Subjective:  Patient ID: Wayne Morales, male    DOB: May 06, 1971  Age: 47 y.o. MRN: [id number]  CC: Back Pain; Annual Exam; and Hypertension   HPI Wayne Morales presents for a CPX.  He complains of chronic intermittent episodes of low back pain that he describes as a nonradiating aching, tightness, pulling, and spasms.  He has been seeing a chiropractor who has told him that he may benefit from seeing a massage therapist.  He has tried Flexeril, Neurontin, and Advil to control the symptoms without any improvement.  An MRI done a year ago showed a protruding disc.  He denies paresthesias in his lower extremities.  His blood pressure has not been well controlled.  He has not been compliant with antihypertensive therapy.  He is very active and denies any recent episodes of chest pain or shortness of breath.  He complains of abnormal great toenails on both sides.  This was previously treated for onychomycosis.  He wants to treat it again.  Outpatient Medications Prior to Visit  Medication Sig Dispense Refill  . rosuvastatin (CRESTOR) 20 MG tablet Take 1 tablet (20 mg total) by mouth daily. Must keep scheduled appt for future refills 30 tablet 0  . TRANSDERM-SCOP, 1.5 MG, 1 MG/3DAYS UNWRAP AND APPLY 1 PATCH BEHIND EAR EVERY 3 DAYS (Patient not taking: Reported on 09/07/2017) 4 patch 0  . cyclobenzaprine (FLEXERIL) 5 MG tablet Take 1 tablet (5 mg total) by mouth 3 (three) times daily as needed for muscle spasms. 60 tablet 1  . gabapentin (NEURONTIN) 100 MG capsule Take 1 capsule (100 mg total) by mouth 3 (three) times daily. 90 capsule 3  . ibuprofen (ADVIL,MOTRIN) 800 MG tablet Take 1 tablet (800 mg total) by mouth 3 (three) times daily. 21 tablet 0  . nebivolol (BYSTOLIC) 10 MG tablet Take 1 tablet (10 mg total) by mouth daily. 90 tablet 3  . predniSONE (DELTASONE) 10 MG tablet 3 tabs by mouth per day for 3 days,2tabs per day for 3 days,1tab per day for 3 days 18 tablet 0   No  facility-administered medications prior to visit.     ROS Review of Systems  Constitutional: Negative.  Negative for appetite change, diaphoresis, fatigue and unexpected weight change.  HENT: Negative.  Negative for trouble swallowing.   Eyes: Negative for visual disturbance.  Respiratory: Negative for cough, chest tightness, shortness of breath and wheezing.   Cardiovascular: Negative for chest pain, palpitations and leg swelling.  Gastrointestinal: Negative for abdominal pain, constipation, diarrhea, nausea and vomiting.  Endocrine: Negative.   Genitourinary: Negative.  Negative for difficulty urinating, dysuria, penile swelling, scrotal swelling, testicular pain and urgency.  Musculoskeletal: Positive for back pain. Negative for arthralgias, joint swelling and myalgias.  Skin: Negative.   Allergic/Immunologic: Negative.   Neurological: Negative.  Negative for dizziness, tremors, weakness, light-headedness and numbness.  Hematological: Negative for adenopathy. Does not bruise/bleed easily.  Psychiatric/Behavioral: Negative.     Objective:  BP (!) 138/100 (BP Location: Left Arm, Patient Position: Sitting, Cuff Size: Large) Comment: BP (R) 138/100 (L) 144/98  Pulse 74   Temp (!) 97.5 F (36.4 C) (Oral)   Resp 16   Ht 5\' 7"  (1.702 m)   Wt 201 lb 12 oz (91.5 kg)   SpO2 98%   BMI 31.60 kg/m   BP Readings from Last 3 Encounters:  09/07/17 (!) 138/100  07/22/16 140/80  12/24/14 138/82    Wt Readings from Last 3 Encounters:  09/07/17 201 lb 12 oz (91.5 kg)  07/22/16 199 lb (90.3 kg)  12/24/14 195 lb 8 oz (88.7 kg)    Physical Exam  Constitutional: No distress.  HENT:  Mouth/Throat: Oropharynx is clear and moist. No oropharyngeal exudate.  Eyes: Conjunctivae are normal. Right eye exhibits no discharge. Left eye exhibits no discharge. No scleral icterus.  Neck: Normal range of motion. Neck supple. No JVD present. No thyromegaly present.  Cardiovascular: Normal rate, regular  rhythm and normal heart sounds. Exam reveals no gallop and no friction rub.  No murmur heard. EKG---  Sinus  Rhythm  -Anterolateral ST-elevation -repolarization variant.   PROBABLY NORMAL- no change from the prior EKG  Pulmonary/Chest: Effort normal and breath sounds normal. No respiratory distress. He has no wheezes. He has no rales.  Abdominal: Soft. Bowel sounds are normal. He exhibits no distension and no mass. There is no tenderness. There is no guarding. Hernia confirmed negative in the right inguinal area and confirmed negative in the left inguinal area.  Genitourinary: Testes normal and penis normal. Right testis shows no mass, no swelling and no tenderness. Left testis shows no mass, no swelling and no tenderness. Circumcised. No penile erythema or penile tenderness. No discharge found.  Musculoskeletal:       Lumbar back: Normal. He exhibits normal range of motion, no tenderness, no bony tenderness, no swelling, no edema, no deformity, no pain and no spasm.  Lymphadenopathy:    He has no cervical adenopathy.       Right: No inguinal adenopathy present.       Left: No inguinal adenopathy present.  Neurological: He is alert. He has normal strength. He displays no atrophy, no tremor and normal reflexes. No cranial nerve deficit or sensory deficit. He exhibits normal muscle tone. He displays a negative Romberg sign. He displays no seizure activity. Coordination and gait normal.  Neg SLR in BLE  Skin: He is not diaphoretic.  Both great toenails show nail dystrophy with lysis, thickening, and subungual debris.  Vitals reviewed.   Lab Results  Component Value Date   WBC 8.3 09/07/2017   HGB 16.1 09/07/2017   HCT 46.9 09/07/2017   PLT 190.0 09/07/2017   GLUCOSE 94 09/07/2017   CHOL 208 (H) 09/07/2017   TRIG 314.0 (H) 09/07/2017   HDL 32.50 (L) 09/07/2017   LDLDIRECT 87.0 09/07/2017   ALT 33 09/07/2017   AST 21 09/07/2017   NA 141 09/07/2017   K 4.1 09/07/2017   CL 104  09/07/2017   CREATININE 1.16 09/07/2017   BUN 11 09/07/2017   CO2 31 09/07/2017   TSH 0.67 09/07/2017   PSA 0.56 09/07/2017   HGBA1C 5.1 09/07/2017    Mr Lumbar Spine Wo Contrast  Result Date: 07/25/2016 CLINICAL DATA:  Chronic recurring low back pain.  Left leg pain. EXAM: MRI LUMBAR SPINE WITHOUT CONTRAST TECHNIQUE: Multiplanar, multisequence MR imaging of the lumbar spine was performed. No intravenous contrast was administered. COMPARISON:  None. FINDINGS: Segmentation:  Standard. Alignment:  Physiologic. Vertebrae:  No fracture, evidence of discitis, or bone lesion. Conus medullaris: Extends to the L1 level and appears normal. Paraspinal and other soft tissues: Negative Disc levels: T12- L1: Unremarkable. L1-L2: Unremarkable. L2-L3: Unremarkable. L3-L4: Mild disc narrowing and desiccation with minimal endplate spurring and bulge. No suspected herniation. No visible impingement L4-L5: Posterior annular fissure and small up turning left paracentral disc protrusion minimally displacing the descending L5 nerve. Borderline facet spurring. Patent foramina L5-S1:Mild disc narrowing and right paracentral disc protrusion without nerve root mass-effect. The disc is mildly  narrowed. Borderline facet spurring. Patent foramina IMPRESSION: 1. L4-5 small left paracentral disc protrusion with minimal mass effect on the descending L5 nerve. 2. L5-S1 small noncompressive right paracentral disc protrusion. Electronically Signed   By: Marnee Spring M.D.   On: 07/25/2016 08:45    Assessment & Plan:   Wayne Morales was seen today for back pain, annual exam and hypertension.  Diagnoses and all orders for this visit:  Essential hypertension- His blood pressure is not adequately well controlled.  His lab work is negative for secondary causes or endorgan damage.  His EKG is negative for LVH.  Will restart Bystolic. -     Comprehensive metabolic panel; Future -     CBC with Differential/Platelet; Future -     TSH;  Future -     Urinalysis, Routine w reflex microscopic; Future -     EKG 12-Lead -     nebivolol (BYSTOLIC) 5 MG tablet; Take 1 tablet (5 mg total) by mouth daily.  Hyperlipidemia with target LDL less than 160- He has achieved his LDL goal and is doing well on the statin. -     rosuvastatin (CRESTOR) 20 MG tablet; Take 1 tablet (20 mg total) by mouth daily. Must keep scheduled appt for future refills  Routine general medical examination at a health care facility- Exam completed, labs ordered and reviewed, vaccines reviewed, patient education material was given. -     Lipid panel; Future -     PSA; Future -     HIV antibody; Future  Hyperglycemia- Improvement noted -     Comprehensive metabolic panel; Future -     Hemoglobin A1c; Future  Protrusion of lumbar intervertebral disc- He was referred to a massage therapist.  Onychomycosis of great toe -     Efinaconazole (JUBLIA) 10 % SOLN; Apply 1 Act topically every morning.  Pure hyperglyceridemia-  Will start treating this with an omega-3 fish oils to reduce the risk of complications. -     omega-3 acid ethyl esters (LOVAZA) 1 g capsule; Take 2 capsules (2 g total) by mouth 2 (two) times daily.   I have discontinued Wayne Morales's nebivolol, ibuprofen, gabapentin, predniSONE, and cyclobenzaprine. I am also having him start on nebivolol, Efinaconazole, and omega-3 acid ethyl esters. Additionally, I am having him maintain his TRANSDERM-SCOP (1.5 MG) and rosuvastatin.  Meds ordered this encounter  Medications  . nebivolol (BYSTOLIC) 5 MG tablet    Sig: Take 1 tablet (5 mg total) by mouth daily.    Dispense:  90 tablet    Refill:  0  . rosuvastatin (CRESTOR) 20 MG tablet    Sig: Take 1 tablet (20 mg total) by mouth daily. Must keep scheduled appt for future refills    Dispense:  90 tablet    Refill:  1  . Efinaconazole (JUBLIA) 10 % SOLN    Sig: Apply 1 Act topically every morning.    Dispense:  8 mL    Refill:  1  . omega-3  acid ethyl esters (LOVAZA) 1 g capsule    Sig: Take 2 capsules (2 g total) by mouth 2 (two) times daily.    Dispense:  360 capsule    Refill:  1     Follow-up: Return in about 3 months (around 12/05/2017).  Sanda Linger, MD

## 2017-09-08 LAB — HIV ANTIBODY (ROUTINE TESTING W REFLEX): HIV 1&2 Ab, 4th Generation: NONREACTIVE

## 2017-09-09 ENCOUNTER — Encounter: Payer: Self-pay | Admitting: Internal Medicine

## 2017-09-09 DIAGNOSIS — E781 Pure hyperglyceridemia: Secondary | ICD-10-CM | POA: Insufficient documentation

## 2017-09-09 MED ORDER — OMEGA-3-ACID ETHYL ESTERS 1 G PO CAPS: 2.0000 g | ORAL_CAPSULE | Freq: Two times a day (BID) | ORAL | 1 refills | Status: DC

## 2017-09-21 ENCOUNTER — Telehealth: Payer: Self-pay

## 2017-09-21 NOTE — Telephone Encounter (Signed)
Key: [id number]

## 2017-09-23 NOTE — Telephone Encounter (Signed)
PA was denied. Is there an alternative that can be sent in?  

## 2018-03-29 ENCOUNTER — Other Ambulatory Visit: Payer: Self-pay | Admitting: Internal Medicine

## 2018-03-29 ENCOUNTER — Telehealth: Payer: Self-pay | Admitting: Internal Medicine

## 2018-03-29 MED ORDER — SCOPOLAMINE 1 MG/3DAYS TD PT72: 1.0000 | MEDICATED_PATCH | TRANSDERMAL | 0 refills | Status: DC

## 2018-03-29 NOTE — Telephone Encounter (Signed)
Copied from CRM [id number]. Topic: Quick Communication - Rx Refill/Question >> Mar 29, 2018 10:47 AM Stephannie Li, NT wrote: Medication: TRANSDERM-SCOP, 1.5 MG, 1 MG/3DAYS  Has the patient contacted their pharmacy? yes  (Agent: If no, request that the patient contact the pharmacy for the refill. (Agent: If yes, when and what did the pharmacy advise?  Preferred Pharmacy (with phone number or street name [id number] [place], [place] - [address] AT [facility] [address] & [address] [phone] (Phone) [phone] (Fax)  Patient is out of medication   Agent: Please be advised that RX refills may take up to 3 business days. We ask that you follow-up with your pharmacy.

## 2018-03-29 NOTE — Telephone Encounter (Signed)
Transderm-Scop 1.5 mg, 1 mg/3 days refill Last Refill:05/28/17 #  Last OV: 09/07/17 PCP: Yetta Barre Pharmacy:[id number]  On 09/07/17 OV it is noted he is no longer taking this medication.

## 2018-03-29 NOTE — Telephone Encounter (Signed)
Pt is rq rx for the Transderm-Scop 1.5mg . Please advise

## 2018-12-04 ENCOUNTER — Other Ambulatory Visit: Payer: Self-pay | Admitting: Internal Medicine

## 2018-12-04 DIAGNOSIS — E785 Hyperlipidemia, unspecified: Secondary | ICD-10-CM

## 2018-12-04 MED ORDER — ROSUVASTATIN CALCIUM 20 MG PO TABS: 20.0000 mg | ORAL_TABLET | Freq: Every day | ORAL | 1 refills | Status: DC

## 2019-09-25 LAB — LIPID PANEL
Cholesterol: 221 — AB (ref 0–200)
HDL: 37 (ref 35–70)
LDL Cholesterol: 146
Triglycerides: 248 — AB (ref 40–160)

## 2019-09-25 LAB — PSA: PSA: 0.4

## 2019-09-25 LAB — BASIC METABOLIC PANEL: BUN: 16 (ref 4–21)

## 2019-10-08 ENCOUNTER — Other Ambulatory Visit: Payer: Self-pay

## 2019-10-08 ENCOUNTER — Ambulatory Visit: Payer: 59 | Admitting: Internal Medicine

## 2019-10-08 ENCOUNTER — Encounter: Payer: Self-pay | Admitting: Internal Medicine

## 2019-10-08 VITALS — BP 136/86 | HR 76 | Temp 98.1°F | Resp 16 | Ht 67.0 in | Wt 204.1 lb

## 2019-10-08 DIAGNOSIS — E785 Hyperlipidemia, unspecified: Secondary | ICD-10-CM | POA: Diagnosis not present

## 2019-10-08 DIAGNOSIS — Z0000 Encounter for general adult medical examination without abnormal findings: Secondary | ICD-10-CM

## 2019-10-08 DIAGNOSIS — E781 Pure hyperglyceridemia: Secondary | ICD-10-CM | POA: Diagnosis not present

## 2019-10-08 DIAGNOSIS — I10 Essential (primary) hypertension: Secondary | ICD-10-CM

## 2019-10-08 LAB — HEPATIC FUNCTION PANEL
ALT: 37 U/L (ref 0–53)
AST: 27 U/L (ref 0–37)
Albumin: 4.5 g/dL (ref 3.5–5.2)
Alkaline Phosphatase: 100 U/L (ref 39–117)
Bilirubin, Direct: 0.1 mg/dL (ref 0.0–0.3)
Total Bilirubin: 0.6 mg/dL (ref 0.2–1.2)
Total Protein: 7.7 g/dL (ref 6.0–8.3)

## 2019-10-08 LAB — CBC WITH DIFFERENTIAL/PLATELET
Basophils Absolute: 0 10*3/uL (ref 0.0–0.1)
Basophils Relative: 0.3 % (ref 0.0–3.0)
Eosinophils Absolute: 0.2 10*3/uL (ref 0.0–0.7)
Eosinophils Relative: 2.4 % (ref 0.0–5.0)
HCT: 46.3 % (ref 39.0–52.0)
Hemoglobin: 16.1 g/dL (ref 13.0–17.0)
Lymphocytes Relative: 29 % (ref 12.0–46.0)
Lymphs Abs: 2.1 10*3/uL (ref 0.7–4.0)
MCHC: 34.8 g/dL (ref 30.0–36.0)
MCV: 94.3 fl (ref 78.0–100.0)
Monocytes Absolute: 0.4 10*3/uL (ref 0.1–1.0)
Monocytes Relative: 5.8 % (ref 3.0–12.0)
Neutro Abs: 4.6 10*3/uL (ref 1.4–7.7)
Neutrophils Relative %: 62.5 % (ref 43.0–77.0)
Platelets: 169 10*3/uL (ref 150.0–400.0)
RBC: 4.91 Mil/uL (ref 4.22–5.81)
RDW: 12.8 % (ref 11.5–15.5)
WBC: 7.4 10*3/uL (ref 4.0–10.5)

## 2019-10-08 LAB — BASIC METABOLIC PANEL
BUN: 14 mg/dL (ref 6–23)
CO2: 27 mEq/L (ref 19–32)
Calcium: 9.7 mg/dL (ref 8.4–10.5)
Chloride: 104 mEq/L (ref 96–112)
Creatinine, Ser: 1.19 mg/dL (ref 0.40–1.50)
GFR: 65.12 mL/min (ref >60.00)
Glucose, Bld: 144 mg/dL — ABNORMAL HIGH (ref 70–99)
Potassium: 3.9 mEq/L (ref 3.5–5.1)
Sodium: 138 mEq/L (ref 135–145)

## 2019-10-08 LAB — TSH: TSH: 0.7 u[IU]/mL (ref 0.35–4.50)

## 2019-10-08 MED ORDER — SCOPOLAMINE 1 MG/3DAYS TD PT72: 1.0000 | MEDICATED_PATCH | TRANSDERMAL | 0 refills | Status: DC

## 2019-10-08 NOTE — Progress Notes (Signed)
Subjective:  Patient ID: Wayne Morales, male    DOB: 1971-04-17  Age: 49 y.o. MRN: [id number]  CC: Annual Exam, Hyperlipidemia, and Hypertension  This visit occurred during the SARS-CoV-2 public health emergency.  Safety protocols were in place, including screening questions prior to the visit, additional usage of staff PPE, and extensive cleaning of exam room while observing appropriate contact time as indicated for disinfecting solutions.    HPI Wayne Morales presents for a CPX.  He has decided not to take an antihypertensive.  He is controlling his blood pressure with lifestyle modifications.  He denies any recent episodes of chest pain, shortness of breath, palpitations, edema, or fatigue.  He had labs done elsewhere by his employer recently and was told that his potassium level was elevated.  Outpatient Medications Prior to Visit  Medication Sig Dispense Refill  . rosuvastatin (CRESTOR) 20 MG tablet Take 1 tablet (20 mg total) by mouth daily. Must keep scheduled appt for future refills 90 tablet 1  . Efinaconazole (JUBLIA) 10 % SOLN Apply 1 Act topically every morning. 8 mL 1  . nebivolol (BYSTOLIC) 5 MG tablet Take 1 tablet (5 mg total) by mouth daily. 90 tablet 0  . omega-3 acid ethyl esters (LOVAZA) 1 g capsule Take 2 capsules (2 g total) by mouth 2 (two) times daily. 360 capsule 1  . scopolamine (TRANSDERM-SCOP, 1.5 MG,) 1 MG/3DAYS Place 1 patch (1.5 mg total) onto the skin every 3 (three) days. 4 patch 0   No facility-administered medications prior to visit.    ROS Review of Systems  Constitutional: Negative for appetite change, diaphoresis, fatigue and unexpected weight change.  HENT: Negative.   Eyes: Negative for visual disturbance.  Respiratory: Negative for cough, chest tightness, shortness of breath and wheezing.   Cardiovascular: Negative for chest pain, palpitations and leg swelling.  Gastrointestinal: Negative for abdominal pain, constipation, diarrhea,  nausea and vomiting.  Endocrine: Negative.   Genitourinary: Negative.  Negative for difficulty urinating, penile swelling, scrotal swelling and testicular pain.  Musculoskeletal: Negative.  Negative for arthralgias and myalgias.  Skin: Negative.   Neurological: Negative.  Negative for dizziness, weakness and light-headedness.  Hematological: Negative for adenopathy. Does not bruise/bleed easily.  Psychiatric/Behavioral: Negative.     Objective:  BP 136/86 (BP Location: Left Arm, Patient Position: Sitting, Cuff Size: Large)   Pulse 76   Temp 98.1 F (36.7 C) (Oral)   Resp 16   Ht 5\' 7"  (1.702 m)   Wt 204 lb 2 oz (92.6 kg)   SpO2 98%   BMI 31.97 kg/m   BP Readings from Last 3 Encounters:  10/08/19 136/86  09/07/17 (!) 138/100  07/22/16 140/80    Wt Readings from Last 3 Encounters:  10/08/19 204 lb 2 oz (92.6 kg)  09/07/17 201 lb 12 oz (91.5 kg)  07/22/16 199 lb (90.3 kg)    Physical Exam Vitals reviewed.  Constitutional:      Appearance: Normal appearance.  HENT:     Nose: Nose normal.     Mouth/Throat:     Mouth: Mucous membranes are moist.  Eyes:     General: No scleral icterus.    Conjunctiva/sclera: Conjunctivae normal.  Cardiovascular:     Rate and Rhythm: Normal rate and regular rhythm.     Heart sounds: No murmur.  Pulmonary:     Effort: Pulmonary effort is normal.     Breath sounds: No stridor. No wheezing, rhonchi or rales.  Abdominal:     General: Abdomen is  flat.     Palpations: There is no mass.     Tenderness: There is no abdominal tenderness. There is no guarding.     Hernia: There is no hernia in the left inguinal area or right inguinal area.  Genitourinary:    Pubic Area: No rash.      Penis: Normal. No discharge, swelling or lesions.      Testes: Normal.        Right: Mass or tenderness not present.        Left: Mass or tenderness not present.     Epididymis:     Right: Normal. Not inflamed or enlarged. No mass.     Left: Not inflamed or  enlarged. No mass.     Prostate: Normal. Not enlarged, not tender and no nodules present.     Rectum: Normal. Guaiac result negative. No mass, tenderness, anal fissure, external hemorrhoid or internal hemorrhoid. Normal anal tone.  Musculoskeletal:        General: Normal range of motion.     Cervical back: Neck supple.     Right lower leg: No edema.     Left lower leg: No edema.  Lymphadenopathy:     Cervical: No cervical adenopathy.     Lower Body: No right inguinal adenopathy. No left inguinal adenopathy.  Skin:    General: Skin is warm and dry.  Neurological:     General: No focal deficit present.     Mental Status: He is alert and oriented to person, place, and time. Mental status is at baseline.  Psychiatric:        Mood and Affect: Mood normal.        Behavior: Behavior normal.     Lab Results  Component Value Date   WBC 7.4 10/08/2019   HGB 16.1 10/08/2019   HCT 46.3 10/08/2019   PLT 169.0 10/08/2019   GLUCOSE 144 (H) 10/08/2019   CHOL 221 (A) 09/25/2019   TRIG 248 (A) 09/25/2019   HDL 37 09/25/2019   LDLDIRECT 87.0 09/07/2017   LDLCALC 146 09/25/2019   ALT 37 10/08/2019   AST 27 10/08/2019   NA 138 10/08/2019   K 3.9 10/08/2019   CL 104 10/08/2019   CREATININE 1.19 10/08/2019   BUN 14 10/08/2019   CO2 27 10/08/2019   TSH 0.70 10/08/2019   PSA 0.4 09/25/2019   HGBA1C 5.1 09/07/2017    MR LUMBAR SPINE WO CONTRAST  Result Date: 07/25/2016 CLINICAL DATA:  Chronic recurring low back pain.  Left leg pain. EXAM: MRI LUMBAR SPINE WITHOUT CONTRAST TECHNIQUE: Multiplanar, multisequence MR imaging of the lumbar spine was performed. No intravenous contrast was administered. COMPARISON:  None. FINDINGS: Segmentation:  Standard. Alignment:  Physiologic. Vertebrae:  No fracture, evidence of discitis, or bone lesion. Conus medullaris: Extends to the L1 level and appears normal. Paraspinal and other soft tissues: Negative Disc levels: T12- L1: Unremarkable. L1-L2: Unremarkable.  L2-L3: Unremarkable. L3-L4: Mild disc narrowing and desiccation with minimal endplate spurring and bulge. No suspected herniation. No visible impingement L4-L5: Posterior annular fissure and small up turning left paracentral disc protrusion minimally displacing the descending L5 nerve. Borderline facet spurring. Patent foramina L5-S1:Mild disc narrowing and right paracentral disc protrusion without nerve root mass-effect. The disc is mildly narrowed. Borderline facet spurring. Patent foramina IMPRESSION: 1. L4-5 small left paracentral disc protrusion with minimal mass effect on the descending L5 nerve. 2. L5-S1 small noncompressive right paracentral disc protrusion. Electronically Signed   By: Neva Seat.D.  On: 07/25/2016 08:45    Assessment & Plan:   Wayne Morales was seen today for annual exam, hyperlipidemia and hypertension.  Diagnoses and all orders for this visit:  Essential hypertension- His blood pressure is adequately well controlled with lifestyle modifications.  His electrolytes and renal function are normal. -     CBC with Differential/Platelet; Future -     Basic metabolic panel; Future -     TSH; Future -     TSH -     Basic metabolic panel -     CBC with Differential/Platelet  Routine general medical examination at a health care facility- Exam completed, labs reviewed, he refused a flu vaccine, patient education was given. -     Cancel: Lipid panel; Future -     Cancel: PSA; Future  Hyperlipidemia with target LDL less than 160- He has achieved his LDL goal and is doing well on the statin. -     TSH; Future -     Hepatic function panel; Future -     Hepatic function panel -     TSH -     rosuvastatin (CRESTOR) 20 MG tablet; Take 1 tablet (20 mg total) by mouth daily. Must keep scheduled appt for future refills  Pure hyperglyceridemia- He will continue to work on his lifestyle modifications to control this. -     Hepatic function panel; Future -     Hepatic function  panel  Other orders -     scopolamine (TRANSDERM-SCOP, 1.5 MG,) 1 MG/3DAYS; Place 1 patch (1.5 mg total) onto the skin every 3 (three) days.   I have discontinued Wayne Morales's nebivolol, Efinaconazole, and omega-3 acid ethyl esters. I am also having him maintain his scopolamine and rosuvastatin.  Meds ordered this encounter  Medications  . scopolamine (TRANSDERM-SCOP, 1.5 MG,) 1 MG/3DAYS    Sig: Place 1 patch (1.5 mg total) onto the skin every 3 (three) days.    Dispense:  4 patch    Refill:  0  . rosuvastatin (CRESTOR) 20 MG tablet    Sig: Take 1 tablet (20 mg total) by mouth daily. Must keep scheduled appt for future refills    Dispense:  90 tablet    Refill:  1     Follow-up: Return in about 6 months (around 04/09/2020).  Sanda Linger, MD

## 2019-10-08 NOTE — Patient Instructions (Signed)

## 2019-10-09 ENCOUNTER — Encounter: Payer: Self-pay | Admitting: Internal Medicine

## 2019-10-09 MED ORDER — ROSUVASTATIN CALCIUM 20 MG PO TABS: 20.0000 mg | ORAL_TABLET | Freq: Every day | ORAL | 1 refills | Status: DC

## 2020-09-13 ENCOUNTER — Other Ambulatory Visit: Payer: Self-pay

## 2020-09-13 ENCOUNTER — Ambulatory Visit (HOSPITAL_COMMUNITY)
Admission: EM | Admit: 2020-09-13 | Discharge: 2020-09-13 | Disposition: A | Discharge status: Home / Self Care | Payer: Worker's Compensation | Attending: Emergency Medicine | Admitting: Emergency Medicine

## 2020-09-13 ENCOUNTER — Encounter (HOSPITAL_COMMUNITY): Payer: Self-pay | Admitting: *Deleted

## 2020-09-13 DIAGNOSIS — M5442 Lumbago with sciatica, left side: Secondary | ICD-10-CM | POA: Diagnosis not present

## 2020-09-13 DIAGNOSIS — R030 Elevated blood-pressure reading, without diagnosis of hypertension: Secondary | ICD-10-CM

## 2020-09-13 MED ORDER — CYCLOBENZAPRINE HCL 10 MG PO TABS: 10.0000 mg | ORAL_TABLET | Freq: Two times a day (BID) | ORAL | 0 refills | Status: DC | PRN

## 2020-09-13 MED ORDER — IBUPROFEN 800 MG PO TABS: 800.0000 mg | ORAL_TABLET | Freq: Three times a day (TID) | ORAL | 0 refills | Status: DC | PRN

## 2020-09-13 NOTE — ED Triage Notes (Signed)
Pt reports his lower left back started to hurt today while at work. Pain radiates down Lt leg.

## 2020-09-13 NOTE — ED Provider Notes (Signed)
[facility]    CSN: [id number] Arrival date & time: 09/13/20  1549      History   Chief Complaint Chief Complaint  Patient presents with  . Back Pain    HPI Wayne Morales is a 50 y.o. male.   Patient presents with left lower back pain radiating down his left leg since this afternoon.  He states he was at work when the pain began.  No falls or injury.  He states he has a history of back pain but has not had a flare like this in several years.  He denies numbness, weakness, saddle anesthesia, loss of control of bowel/bladder, redness, bruising, wounds, abdominal pain, dysuria, or other symptoms.  No treatments attempted at home.  His medical history includes hypertension, hyperlipidemia, psoriasis, protrusion of lumbar intervertebral disc, chronic back pain.  The history is provided by the patient and medical records.    Past Medical History:  Diagnosis Date  . Chronic back pain   . Hyperlipidemia   . Scalp psoriasis 12/15/2012    Patient Active Problem List   Diagnosis Date Noted  . Pure hyperglyceridemia 09/09/2017  . Routine general medical examination at a health care facility 09/07/2017  . Hyperglycemia 09/07/2017  . Onychomycosis of great toe 09/07/2017  . Protrusion of lumbar intervertebral disc   . Essential hypertension 07/08/2014  . Scalp psoriasis 12/15/2012  . Hyperlipidemia with target LDL less than 160 07/08/2008    Past Surgical History:  Procedure Laterality Date  . HERNIA REPAIR     when was a child       Home Medications    Prior to Admission medications   Medication Sig Start Date End Date Taking? Authorizing Provider  cyclobenzaprine (FLEXERIL) 10 MG tablet Take 1 tablet (10 mg total) by mouth 2 (two) times daily as needed for muscle spasms. 09/13/20  Yes Mickie Bail, NP  ibuprofen (ADVIL) 800 MG tablet Take 1 tablet (800 mg total) by mouth every 8 (eight) hours as needed. 09/13/20  Yes Mickie Bail, NP  rosuvastatin (CRESTOR) 20  MG tablet Take 1 tablet (20 mg total) by mouth daily. Must keep scheduled appt for future refills 10/09/19   Etta Grandchild, MD  scopolamine (TRANSDERM-SCOP, 1.5 MG,) 1 MG/3DAYS Place 1 patch (1.5 mg total) onto the skin every 3 (three) days. 10/08/19   Etta Grandchild, MD    Family History Family History  Problem Relation Age of Onset  . Diabetes Other   . Hyperlipidemia Other   . Hyperlipidemia Mother   . Early death Neg Hx   . Kidney disease Neg Hx   . Hypertension Neg Hx   . Heart disease Neg Hx   . Stroke Neg Hx     Social History Social History   Tobacco Use  . Smoking status: Never Smoker  . Smokeless tobacco: Never Used  Substance Use Topics  . Alcohol use: No  . Drug use: No     Allergies   Penicillins   Review of Systems Review of Systems  Constitutional: Negative for chills and fever.  HENT: Negative for ear pain and sore throat.   Eyes: Negative for pain and visual disturbance.  Respiratory: Negative for cough and shortness of breath.   Cardiovascular: Negative for chest pain and palpitations.  Gastrointestinal: Negative for abdominal pain and vomiting.  Genitourinary: Negative for dysuria and hematuria.  Musculoskeletal: Positive for back pain. Negative for arthralgias.  Skin: Negative for color change and rash.  Neurological: Negative  for syncope, weakness and numbness.  All other systems reviewed and are negative.    Physical Exam Triage Vital Signs ED Triage Vitals  Enc Vitals Group     BP      Pulse      Resp      Temp      Temp src      SpO2      Weight      Height      Head Circumference      Peak Flow      Pain Score      Pain Loc      Pain Edu?      Excl. in GC?    No data found.  Updated Vital Signs BP (!) 173/84 (BP Location: Right Arm)   Pulse 94   Temp 97.8 F (36.6 C) (Oral)   Resp 18   SpO2 100%   Visual Acuity Right Eye Distance:   Left Eye Distance:   Bilateral Distance:    Right Eye Near:   Left Eye Near:     Bilateral Near:     Physical Exam Vitals and nursing note reviewed.  Constitutional:      General: He is not in acute distress.    Appearance: He is well-developed and well-nourished.  HENT:     Head: Normocephalic and atraumatic.     Mouth/Throat:     Mouth: Mucous membranes are moist.  Eyes:     Conjunctiva/sclera: Conjunctivae normal.  Cardiovascular:     Rate and Rhythm: Normal rate and regular rhythm.     Heart sounds: Normal heart sounds.  Pulmonary:     Effort: Pulmonary effort is normal. No respiratory distress.     Breath sounds: Normal breath sounds.  Abdominal:     Palpations: Abdomen is soft.     Tenderness: There is no abdominal tenderness. There is no right CVA tenderness, left CVA tenderness, guarding or rebound.  Musculoskeletal:        General: No swelling, tenderness or edema. Normal range of motion.     Cervical back: Neck supple.  Skin:    General: Skin is warm and dry.     Findings: No bruising, erythema, lesion or rash.  Neurological:     General: No focal deficit present.     Mental Status: He is alert and oriented to person, place, and time.     Sensory: No sensory deficit.     Motor: No weakness.     Gait: Gait abnormal.     Comments: Negative straight leg raise.  Slow limping gait.  Psychiatric:        Mood and Affect: Mood and affect and mood normal.        Behavior: Behavior normal.      UC Treatments / Results  Labs (all labs ordered are listed, but only abnormal results are displayed) Labs Reviewed - No data to display  EKG   Radiology No results found.  Procedures Procedures (including critical care time)  Medications Ordered in UC Medications - No data to display  Initial Impression / Assessment and Plan / UC Course  I have reviewed the triage vital signs and the nursing notes.  Pertinent labs & imaging results that were available during my care of the patient were reviewed by me and considered in my medical decision  making (see chart for details).   Acute left lower back pain with left-sided sciatica.  Elevated blood pressure reading.  Treating with ibuprofen  and Flexeril.  Precautions for drowsiness with Flexeril discussed.  Instructed patient to follow-up with his orthopedist if his symptoms are not improving.  Also discussed that his blood pressure is elevated today needs to be rechecked by his PCP in 1 to 2 weeks.  He agrees to plan of care.   Final Clinical Impressions(s) / UC Diagnoses   Final diagnoses:  Acute left-sided low back pain with left-sided sciatica  Elevated blood pressure reading     Discharge Instructions     Take ibuprofen as needed for discomfort.  Take the muscle relaxer as needed for muscle spasm; Do not drive, operate machinery, or drink alcohol with this medication as it can cause drowsiness.   Follow up with your primary care provider or an orthopedist if your symptoms are not improving.    Your blood pressure is elevated today at 173/84.  Please have this rechecked by your primary care provider in 1-2 weeks.             ED Prescriptions    Medication Sig Dispense Auth. Provider   cyclobenzaprine (FLEXERIL) 10 MG tablet Take 1 tablet (10 mg total) by mouth 2 (two) times daily as needed for muscle spasms. 20 tablet Mickie Bail, NP   ibuprofen (ADVIL) 800 MG tablet Take 1 tablet (800 mg total) by mouth every 8 (eight) hours as needed. 21 tablet Mickie Bail, NP     I have reviewed the PDMP during this encounter.   Mickie Bail, NP 09/13/20 [phone]

## 2020-09-13 NOTE — Discharge Instructions (Addendum)
Take ibuprofen as needed for discomfort.  Take the muscle relaxer as needed for muscle spasm; Do not drive, operate machinery, or drink alcohol with this medication as it can cause drowsiness.   Follow up with your primary care provider or an orthopedist if your symptoms are not improving.    Your blood pressure is elevated today at 173/84.  Please have this rechecked by your primary care provider in 1-2 weeks.

## 2020-11-21 ENCOUNTER — Telehealth (INDEPENDENT_AMBULATORY_CARE_PROVIDER_SITE_OTHER): Payer: 59 | Admitting: Physician Assistant

## 2020-11-21 ENCOUNTER — Encounter: Payer: Self-pay | Admitting: Physician Assistant

## 2020-11-21 VITALS — Temp 98.6°F

## 2020-11-21 DIAGNOSIS — R6889 Other general symptoms and signs: Secondary | ICD-10-CM

## 2020-11-21 LAB — POCT RAPID STREP A (OFFICE): Rapid Strep A Screen: NEGATIVE

## 2020-11-21 LAB — POCT INFLUENZA A/B
Influenza A, POC: NEGATIVE
Influenza B, POC: NEGATIVE

## 2020-11-21 NOTE — Progress Notes (Signed)
Virtual Visit via Video   I connected with Reg Bircher on 11/21/20 at  8:30 AM EDT by a video enabled telemedicine application and verified that I am speaking with the correct person using two identifiers. Location patient: Home Location provider: [facility], Office Persons participating in the virtual visit: Trejan, [place] PA-C  I discussed the limitations of evaluation and management by telemedicine and the availability of in person appointments. The patient expressed understanding and agreed to proceed.  Subjective:   HPI:   Flu-like symptoms Symptoms started two days ago. Had significant sore throat that has improved with time. Has not had fever. Now has dry cough throughout the day. Took ibuprofen around the clock yesterday and his body aches that he had resolved. He is fully vaccinated and boosted for COVID and vaccinated for flu. Has not had either to his knowledge. He is also taking vitamin C.  Denies: fever, chills, malaise, SOB, chest pain, hx of asthma/COPD Has had vaccines but no covid   ROS: See pertinent positives and negatives per HPI.  Patient Active Problem List   Diagnosis Date Noted  . Pure hyperglyceridemia 09/09/2017  . Routine general medical examination at a health care facility 09/07/2017  . Hyperglycemia 09/07/2017  . Onychomycosis of great toe 09/07/2017  . Protrusion of lumbar intervertebral disc   . Essential hypertension 07/08/2014  . Scalp psoriasis 12/15/2012  . Hyperlipidemia with target LDL less than 160 07/08/2008    Social History   Tobacco Use  . Smoking status: Never Smoker  . Smokeless tobacco: Never Used  Substance Use Topics  . Alcohol use: No    Current Outpatient Medications:  .  cyclobenzaprine (FLEXERIL) 10 MG tablet, Take 1 tablet (10 mg total) by mouth 2 (two) times daily as needed for muscle spasms., Disp: 20 tablet, Rfl: 0 .  ibuprofen (ADVIL) 800 MG tablet, Take 1 tablet (800 mg total) by mouth  every 8 (eight) hours as needed., Disp: 21 tablet, Rfl: 0 .  rosuvastatin (CRESTOR) 20 MG tablet, Take 1 tablet (20 mg total) by mouth daily. Must keep scheduled appt for future refills, Disp: 90 tablet, Rfl: 1 .  scopolamine (TRANSDERM-SCOP, 1.5 MG,) 1 MG/3DAYS, Place 1 patch (1.5 mg total) onto the skin every 3 (three) days., Disp: 4 patch, Rfl: 0  Allergies  Allergen Reactions  . Penicillins     REACTION: Rash    Objective:   VITALS: Per patient if applicable, see vitals. GENERAL: Alert, appears well and in no acute distress. HEENT: Atraumatic, conjunctiva clear, no obvious abnormalities on inspection of external nose and ears. NECK: Normal movements of the head and neck. CARDIOPULMONARY: No increased WOB. Speaking in clear sentences. I:E ratio WNL.  MS: Moves all visible extremities without noticeable abnormality. PSYCH: Pleasant and cooperative, well-groomed. Speech normal rate and rhythm. Affect is appropriate. Insight and judgement are appropriate. Attention is focused, linear, and appropriate.  NEURO: CN grossly intact. Oriented as arrived to appointment on time with no prompting. Moves both UE equally.  SKIN: No obvious lesions, wounds, erythema, or cyanosis noted on face or hands.  Results for orders placed or performed in visit on 11/21/20  POCT Influenza A/B  Result Value Ref Range   Influenza A, POC Negative Negative   Influenza B, POC Negative Negative  POCT rapid strep A  Result Value Ref Range   Rapid Strep A Screen Negative Negative    Assessment and Plan:   Marwin was seen today for sore throat and cough.  Diagnoses and all orders for this visit:  Flu-like symptoms -     POCT Influenza A/B -     POCT rapid strep A -     Novel Coronavirus, NAA ([facility])   Drive up strep test and flu test negative. COVID test collected, results pending.  Recommend supportive care at this time. No red flags on discussion. Continue NSAIDs for body aches. Reviewed return  precautions including new fever, SOB, worsening cough or other concerns. Push fluids and rest. I recommend that patient follow-up if symptoms worsen or persist despite treatment x 7-10 days, sooner if needed.  I discussed the assessment and treatment plan with the patient. The patient was provided an opportunity to ask questions and all were answered. The patient agreed with the plan and demonstrated an understanding of the instructions.   The patient was advised to call back or seek an in-person evaluation if the symptoms worsen or if the condition fails to improve as anticipated.   [place], [place] 11/21/2020

## 2020-11-22 ENCOUNTER — Encounter: Payer: Self-pay | Admitting: Physician Assistant

## 2020-11-22 LAB — NOVEL CORONAVIRUS, NAA: SARS-CoV-2, NAA: DETECTED — AB

## 2020-11-22 LAB — SARS-COV-2, NAA 2 DAY TAT

## 2020-11-23 ENCOUNTER — Telehealth: Payer: Self-pay | Admitting: Physician Assistant

## 2020-11-23 NOTE — Telephone Encounter (Signed)
Called to discuss with patient about COVID-19 symptoms and the use of one of the available treatments for those with mild to moderate Covid symptoms and at a high risk of hospitalization.  Pt appears to qualify for outpatient treatment due to co-morbid conditions and/or a member of an at-risk group in accordance with the FDA Emergency Use Authorization.    Symptom onset: ~5/4 based on VV notes Vaccinated: yes Booster? yes Immunocompromised? no Qualifiers: BMI 30 NIH Criteria: 3  Unable to reach pt - left VM and mychart msg   Cline Crock

## 2020-11-24 NOTE — Telephone Encounter (Signed)
Patient symptoms improved,declined Rx.

## 2020-11-24 NOTE — Telephone Encounter (Signed)
Patient was contacted about possibly starting Paxlovid, an oral medication to treat COVID.  Is he interested in this? It needs to be started today if he is due to how long he has had symptoms.  If his symptoms are overall stable and resolved, I don't feel strongly about starting this now.

## 2021-02-18 ENCOUNTER — Ambulatory Visit: Payer: 59 | Admitting: Internal Medicine

## 2021-02-18 ENCOUNTER — Encounter: Payer: Self-pay | Admitting: Internal Medicine

## 2021-02-18 ENCOUNTER — Other Ambulatory Visit: Payer: Self-pay

## 2021-02-18 VITALS — BP 144/82 | HR 95 | Temp 97.9°F | Ht 67.0 in | Wt 210.0 lb

## 2021-02-18 DIAGNOSIS — T753XXD Motion sickness, subsequent encounter: Secondary | ICD-10-CM | POA: Diagnosis not present

## 2021-02-18 DIAGNOSIS — R739 Hyperglycemia, unspecified: Secondary | ICD-10-CM | POA: Diagnosis not present

## 2021-02-18 DIAGNOSIS — I10 Essential (primary) hypertension: Secondary | ICD-10-CM

## 2021-02-18 DIAGNOSIS — L918 Other hypertrophic disorders of the skin: Secondary | ICD-10-CM

## 2021-02-18 DIAGNOSIS — T753XXA Motion sickness, initial encounter: Secondary | ICD-10-CM | POA: Insufficient documentation

## 2021-02-18 MED ORDER — SCOPOLAMINE 1 MG/3DAYS TD PT72: 1.0000 | MEDICATED_PATCH | TRANSDERMAL | 0 refills | Status: DC

## 2021-02-18 NOTE — Patient Instructions (Signed)
Please continue all other medications as before, and refills have been done if requested - the patches  Please have the pharmacy call with any other refills you may need.  Please continue your efforts at being more active, low cholesterol diet, and weight control.  Please keep your appointments with your specialists as you may have planned  You will be contacted regarding the referral for: dermatology

## 2021-02-18 NOTE — Assessment & Plan Note (Signed)
Plans travel soon, for scopalamine patch,  to f/u any worsening symptoms or concerns

## 2021-02-18 NOTE — Progress Notes (Signed)
Patient ID: Wayne Morales, male   DOB: October 28, 1970, 50 y.o.   MRN: [id number]        Chief Complaint: follow up HTN, skin tag, and travel advice       HPI:  Wayne Morales is a 50 y.o. male here with several new skin tags getting larger and irritated, specifically one to the right lower neck, left lateral neck, and left scrotum as well.  Wondering how it happened and if there was a medication to resolve it   Has not seen dermatology recently.  BP at home has been < 140/90 but not checked recently.  Pt denies chest pain, increased sob or doe, wheezing, orthopnea, PND, increased LE swelling, palpitations, dizziness or syncope.   Pt denies polydipsia, polyuria, or new focal neuro s/s.   Pt denies fever, wt loss, night sweats, loss of appetite, or other constitutional symptoms  Also going on a cruise, needs scopalamine patch as this has helped greatly in the past.         Wt Readings from Last 3 Encounters:  02/18/21 210 lb (95.3 kg)  10/08/19 204 lb 2 oz (92.6 kg)  09/07/17 201 lb 12 oz (91.5 kg)   BP Readings from Last 3 Encounters:  02/18/21 (!) 144/82  09/13/20 (!) 173/84  10/08/19 136/86         Past Medical History:  Diagnosis Date   Chronic back pain    Hyperlipidemia    Scalp psoriasis 12/15/2012   Past Surgical History:  Procedure Laterality Date   HERNIA REPAIR     when was a child    reports that he has never smoked. He has never used smokeless tobacco. He reports that he does not drink alcohol and does not use drugs. family history includes Diabetes in an other family member; Hyperlipidemia in his mother and another family member. Allergies  Allergen Reactions   Penicillins Hives    REACTION: Rash   Current Outpatient Medications on File Prior to Visit  Medication Sig Dispense Refill   cyclobenzaprine (FLEXERIL) 10 MG tablet Take 1 tablet (10 mg total) by mouth 2 (two) times daily as needed for muscle spasms. 20 tablet 0   ibuprofen (ADVIL) 800 MG tablet Take 1 tablet  (800 mg total) by mouth every 8 (eight) hours as needed. 21 tablet 0   rosuvastatin (CRESTOR) 20 MG tablet Take 1 tablet (20 mg total) by mouth daily. Must keep scheduled appt for future refills 90 tablet 1   No current facility-administered medications on file prior to visit.        ROS:  All others reviewed and negative.  Objective        PE:  BP (!) 144/82 (BP Location: Left Arm, Patient Position: Sitting, Cuff Size: Large)   Pulse 95   Temp 97.9 F (36.6 C) (Oral)   Ht 5\' 7"  (1.702 m)   Wt 210 lb (95.3 kg)   SpO2 97%   BMI 32.89 kg/m                 Constitutional: Pt appears in NAD               HENT: Head: NCAT.                Right Ear: External ear normal.                 Left Ear: External ear normal.  Eyes: . Pupils are equal, round, and reactive to light. Conjunctivae and EOM are normal               Nose: without d/c or deformity               Neck: Neck supple. Gross normal ROM               Cardiovascular: Normal rate and regular rhythm.                 Pulmonary/Chest: Effort normal and breath sounds without rales or wheezing.                GU: normal male and scrotal contents               Neurological: Pt is alert. At baseline orientation, motor grossly intact               Skin: LE edema - none, multiple irritated skin tags to neck and left scrotum               Psychiatric: Pt behavior is normal without agitation   Micro: none  Cardiac tracings I have personally interpreted today:  none  Pertinent Radiological findings (summarize): none   Lab Results  Component Value Date   WBC 7.4 10/08/2019   HGB 16.1 10/08/2019   HCT 46.3 10/08/2019   PLT 169.0 10/08/2019   GLUCOSE 144 (H) 10/08/2019   CHOL 221 (A) 09/25/2019   TRIG 248 (A) 09/25/2019   HDL 37 09/25/2019   LDLDIRECT 87.0 09/07/2017   LDLCALC 146 09/25/2019   ALT 37 10/08/2019   AST 27 10/08/2019   NA 138 10/08/2019   K 3.9 10/08/2019   CL 104 10/08/2019   CREATININE 1.19  10/08/2019   BUN 14 10/08/2019   CO2 27 10/08/2019   TSH 0.70 10/08/2019   PSA 0.4 09/25/2019   HGBA1C 5.1 09/07/2017   Assessment/Plan:  Wayne Morales is a 50 y.o. Other or two or more races [6] male with  has a past medical history of Chronic back pain, Hyperlipidemia, and Scalp psoriasis (12/15/2012).  Motion sickness Plans travel soon, for scopalamine patch,  to f/u any worsening symptoms or concerns  Hyperglycemia Lab Results  Component Value Date   HGBA1C 5.1 09/07/2017   Stable, pt to continue current medical treatment  - diet   Essential hypertension BP Readings from Last 3 Encounters:  02/18/21 (!) 144/82  09/13/20 (!) 173/84  10/08/19 136/86   Mild uncontrolled, possibly situational, declines med change, to continue to monitor Bp tahome nad next visit   Skin tag With several irritated tags, for dermatology referral for removal  Followup: Return if symptoms worsen or fail to improve.  Oliver Barre, MD 02/18/2021 7:08 PM [facility]

## 2021-02-18 NOTE — Assessment & Plan Note (Signed)
BP Readings from Last 3 Encounters:  02/18/21 (!) 144/82  09/13/20 (!) 173/84  10/08/19 136/86   Mild uncontrolled, possibly situational, declines med change, to continue to monitor Bp tahome nad next visit

## 2021-02-18 NOTE — Assessment & Plan Note (Signed)
With several irritated tags, for dermatology referral for removal

## 2021-02-18 NOTE — Assessment & Plan Note (Signed)
Lab Results  Component Value Date   HGBA1C 5.1 09/07/2017   Stable, pt to continue current medical treatment  - diet

## 2021-04-01 ENCOUNTER — Encounter: Payer: Self-pay | Admitting: Internal Medicine

## 2021-04-01 ENCOUNTER — Ambulatory Visit (INDEPENDENT_AMBULATORY_CARE_PROVIDER_SITE_OTHER): Payer: 59 | Admitting: Internal Medicine

## 2021-04-01 ENCOUNTER — Other Ambulatory Visit: Payer: Self-pay

## 2021-04-01 VITALS — BP 158/98 | HR 86 | Temp 97.6°F | Resp 16 | Ht 67.0 in | Wt 212.0 lb

## 2021-04-01 DIAGNOSIS — R739 Hyperglycemia, unspecified: Secondary | ICD-10-CM

## 2021-04-01 DIAGNOSIS — E559 Vitamin D deficiency, unspecified: Secondary | ICD-10-CM

## 2021-04-01 DIAGNOSIS — I10 Essential (primary) hypertension: Secondary | ICD-10-CM

## 2021-04-01 DIAGNOSIS — E785 Hyperlipidemia, unspecified: Secondary | ICD-10-CM | POA: Diagnosis not present

## 2021-04-01 DIAGNOSIS — Z0001 Encounter for general adult medical examination with abnormal findings: Secondary | ICD-10-CM

## 2021-04-01 DIAGNOSIS — Z1159 Encounter for screening for other viral diseases: Secondary | ICD-10-CM | POA: Insufficient documentation

## 2021-04-01 DIAGNOSIS — Z1211 Encounter for screening for malignant neoplasm of colon: Secondary | ICD-10-CM

## 2021-04-01 DIAGNOSIS — Z23 Encounter for immunization: Secondary | ICD-10-CM | POA: Diagnosis not present

## 2021-04-01 LAB — CBC WITH DIFFERENTIAL/PLATELET
Basophils Absolute: 0 10*3/uL (ref 0.0–0.1)
Basophils Relative: 0.3 % (ref 0.0–3.0)
Eosinophils Absolute: 0.2 10*3/uL (ref 0.0–0.7)
Eosinophils Relative: 3.1 % (ref 0.0–5.0)
HCT: 47.2 % (ref 39.0–52.0)
Hemoglobin: 16.1 g/dL (ref 13.0–17.0)
Lymphocytes Relative: 31.4 % (ref 12.0–46.0)
Lymphs Abs: 2 10*3/uL (ref 0.7–4.0)
MCHC: 34 g/dL (ref 30.0–36.0)
MCV: 95.2 fl (ref 78.0–100.0)
Monocytes Absolute: 0.5 10*3/uL (ref 0.1–1.0)
Monocytes Relative: 7.4 % (ref 3.0–12.0)
Neutro Abs: 3.8 10*3/uL (ref 1.4–7.7)
Neutrophils Relative %: 57.8 % (ref 43.0–77.0)
Platelets: 163 10*3/uL (ref 150.0–400.0)
RBC: 4.96 Mil/uL (ref 4.22–5.81)
RDW: 13.5 % (ref 11.5–15.5)
WBC: 6.5 10*3/uL (ref 4.0–10.5)

## 2021-04-01 LAB — HEMOGLOBIN A1C: Hgb A1c MFr Bld: 5.2 % (ref 4.6–6.5)

## 2021-04-01 LAB — LDL CHOLESTEROL, DIRECT: Direct LDL: 84 mg/dL

## 2021-04-01 LAB — LIPID PANEL
Cholesterol: 214 mg/dL — ABNORMAL HIGH (ref 0–200)
HDL: 33.1 mg/dL — ABNORMAL LOW (ref >39.00)
Total CHOL/HDL Ratio: 6
Triglycerides: 413 mg/dL — ABNORMAL HIGH (ref 0.0–149.0)

## 2021-04-01 LAB — BASIC METABOLIC PANEL
BUN: 11 mg/dL (ref 6–23)
CO2: 29 mEq/L (ref 19–32)
Calcium: 9.5 mg/dL (ref 8.4–10.5)
Chloride: 102 mEq/L (ref 96–112)
Creatinine, Ser: 1.25 mg/dL (ref 0.40–1.50)
GFR: 67.41 mL/min (ref >60.00)
Glucose, Bld: 118 mg/dL — ABNORMAL HIGH (ref 70–99)
Potassium: 3.9 mEq/L (ref 3.5–5.1)
Sodium: 139 mEq/L (ref 135–145)

## 2021-04-01 LAB — TSH: TSH: 0.55 u[IU]/mL (ref 0.35–5.50)

## 2021-04-01 LAB — URINALYSIS, ROUTINE W REFLEX MICROSCOPIC
Bilirubin Urine: NEGATIVE
Hgb urine dipstick: NEGATIVE
Ketones, ur: NEGATIVE
Leukocytes,Ua: NEGATIVE
Nitrite: NEGATIVE
RBC / HPF: NONE SEEN (ref 0–?)
Specific Gravity, Urine: 1.015 (ref 1.000–1.030)
Total Protein, Urine: NEGATIVE
Urine Glucose: NEGATIVE
Urobilinogen, UA: 0.2 (ref 0.0–1.0)
pH: 6 (ref 5.0–8.0)

## 2021-04-01 LAB — VITAMIN D 25 HYDROXY (VIT D DEFICIENCY, FRACTURES): VITD: 13.33 ng/mL — ABNORMAL LOW (ref 30.00–100.00)

## 2021-04-01 LAB — HEPATIC FUNCTION PANEL
ALT: 32 U/L (ref 0–53)
AST: 25 U/L (ref 0–37)
Albumin: 4.3 g/dL (ref 3.5–5.2)
Alkaline Phosphatase: 88 U/L (ref 39–117)
Bilirubin, Direct: 0.1 mg/dL (ref 0.0–0.3)
Total Bilirubin: 0.6 mg/dL (ref 0.2–1.2)
Total Protein: 7.2 g/dL (ref 6.0–8.3)

## 2021-04-01 MED ORDER — TRIAMTERENE-HCTZ 37.5-25 MG PO CAPS: 1.0000 | ORAL_CAPSULE | Freq: Every day | ORAL | 0 refills | Status: DC

## 2021-04-01 MED ORDER — CHOLECALCIFEROL 1.25 MG (50000 UT) PO CAPS
50000.0000 [IU] | ORAL_CAPSULE | ORAL | 0 refills | Status: DC
Start: 2021-04-01 — End: 2021-11-18

## 2021-04-01 NOTE — Patient Instructions (Signed)

## 2021-04-01 NOTE — Progress Notes (Signed)
Subjective:  Patient ID: Wayne Morales, male    DOB: 1970-11-28  Age: 50 y.o. MRN: [id number]  CC: Annual Exam and Hypertension  This visit occurred during the SARS-CoV-2 public health emergency.  Safety protocols were in place, including screening questions prior to the visit, additional usage of staff PPE, and extensive cleaning of exam room while observing appropriate contact time as indicated for disinfecting solutions.    HPI Wayne Morales presents for a CPX and f/up -   He has felt well recently and offers no complaints.  He denies headache, blurred vision, chest pain, shortness of breath, dyspnea on exertion, edema, or fatigue.  Outpatient Medications Prior to Visit  Medication Sig Dispense Refill   rosuvastatin (CRESTOR) 20 MG tablet Take 1 tablet (20 mg total) by mouth daily. Must keep scheduled appt for future refills 90 tablet 1   scopolamine (TRANSDERM-SCOP, 1.5 MG,) 1 MG/3DAYS Place 1 patch (1.5 mg total) onto the skin every 3 (three) days. 4 patch 0   cyclobenzaprine (FLEXERIL) 10 MG tablet Take 1 tablet (10 mg total) by mouth 2 (two) times daily as needed for muscle spasms. 20 tablet 0   ibuprofen (ADVIL) 800 MG tablet Take 1 tablet (800 mg total) by mouth every 8 (eight) hours as needed. 21 tablet 0   No facility-administered medications prior to visit.    ROS Review of Systems  Constitutional:  Negative for chills, diaphoresis, fatigue, fever and unexpected weight change.  HENT: Negative.    Eyes: Negative.   Respiratory:  Negative for cough, chest tightness, shortness of breath and wheezing.   Cardiovascular:  Negative for chest pain, palpitations and leg swelling.  Gastrointestinal:  Negative for abdominal pain, constipation, diarrhea, nausea and vomiting.  Endocrine: Negative.   Genitourinary: Negative.  Negative for difficulty urinating, hematuria, scrotal swelling and testicular pain.  Musculoskeletal:  Negative for arthralgias, joint swelling and  myalgias.  Skin: Negative.  Negative for color change and pallor.  Neurological:  Negative for dizziness, weakness, light-headedness and numbness.  Hematological:  Negative for adenopathy. Does not bruise/bleed easily.  Psychiatric/Behavioral: Negative.     Objective:  BP (!) 158/98 (BP Location: Left Arm, Patient Position: Sitting, Cuff Size: Large)   Pulse 86   Temp 97.6 F (36.4 C) (Oral)   Resp 16   Ht 5\' 7"  (1.702 m)   Wt 212 lb (96.2 kg)   SpO2 97%   BMI 33.20 kg/m   BP Readings from Last 3 Encounters:  04/02/21 (!) 157/102  04/01/21 (!) 158/98  02/18/21 (!) 144/82    Wt Readings from Last 3 Encounters:  04/02/21 212 lb (96.2 kg)  04/01/21 212 lb (96.2 kg)  02/18/21 210 lb (95.3 kg)    Physical Exam Vitals reviewed.  Constitutional:      Appearance: Normal appearance.  HENT:     Nose: Nose normal.     Mouth/Throat:     Mouth: Mucous membranes are moist.  Eyes:     General: No scleral icterus.    Conjunctiva/sclera: Conjunctivae normal.  Cardiovascular:     Rate and Rhythm: Normal rate and regular rhythm.     Heart sounds: Normal heart sounds, S1 normal and S2 normal. No murmur heard.   No gallop.     Comments: EKG- NSR, 75 bpm Flat T waves in V4V5 No LVH or Q waves Pulmonary:     Effort: Pulmonary effort is normal.     Breath sounds: No stridor. No wheezing, rhonchi or rales.  Abdominal:  General: Abdomen is protuberant. Bowel sounds are normal. There is no distension.     Palpations: Abdomen is soft. There is no hepatomegaly, splenomegaly or mass.     Tenderness: There is no abdominal tenderness.  Musculoskeletal:     Cervical back: Neck supple.     Right lower leg: No edema.     Left lower leg: No edema.  Lymphadenopathy:     Cervical: No cervical adenopathy.  Skin:    General: Skin is warm and dry.     Coloration: Skin is not pale.  Neurological:     General: No focal deficit present.     Mental Status: He is alert. Mental status is at  baseline.  Psychiatric:        Mood and Affect: Mood normal.        Behavior: Behavior normal.    Lab Results  Component Value Date   WBC 7.7 04/02/2021   HGB 16.4 04/02/2021   HCT 47.4 04/02/2021   PLT 182 04/02/2021   GLUCOSE 118 (H) 04/01/2021   CHOL 214 (H) 04/01/2021   TRIG (H) 04/01/2021    413.0 Triglyceride is over 400; calculations on Lipids are invalid.   HDL 33.10 (L) 04/01/2021   LDLDIRECT 84.0 04/01/2021   LDLCALC 146 09/25/2019   ALT 32 04/01/2021   AST 25 04/01/2021   NA 139 04/01/2021   K 3.9 04/01/2021   CL 102 04/01/2021   CREATININE 1.25 04/01/2021   BUN 11 04/01/2021   CO2 29 04/01/2021   TSH 0.55 04/01/2021   PSA 0.4 09/25/2019   HGBA1C 5.2 04/01/2021    No results found.  Assessment & Plan:   Cote was seen today for annual exam and hypertension.  Diagnoses and all orders for this visit:  Essential hypertension- He has developed stage II hypertension.  Will check labs to screen for secondary causes and endorgan damage.  I recommended that he start taking the combination of triamterene and hydrochlorothiazide. -     EKG 12-Lead -     CBC with Differential/Platelet; Future -     TSH; Future -     Urinalysis, Routine w reflex microscopic; Future -     Hepatic function panel; Future -     Aldosterone + renin activity w/ ratio; Future -     VITAMIN D 25 Hydroxy (Vit-D Deficiency, Fractures); Future -     CBC with Differential/Platelet -     TSH -     Urinalysis, Routine w reflex microscopic -     Hepatic function panel -     Aldosterone + renin activity w/ ratio -     VITAMIN D 25 Hydroxy (Vit-D Deficiency, Fractures) -     triamterene-hydrochlorothiazide (DYAZIDE) 37.5-25 MG capsule; Take 1 each (1 capsule total) by mouth daily.  Hyperglycemia- His A1c is normal. -     Basic metabolic panel; Future -     Hepatic function panel; Future -     Hemoglobin A1c; Future -     Basic metabolic panel -     Hepatic function panel -     Hemoglobin  A1c  Encounter for general adult medical examination with abnormal findings- Exam completed, labs reviewed, vaccines reviewed and updated, cancer screenings addressed, patient education was given. -     Lipid panel; Future -     Lipid panel -     PSA; Future  Need for hepatitis C screening test -     Hepatitis C antibody; Future -  Hepatitis C antibody  Colon cancer screening -     Cologuard  Vitamin D deficiency disease -     Cholecalciferol 1.25 MG (50000 UT) capsule; Take 1 capsule (50,000 Units total) by mouth once a week.  Hyperlipidemia with target LDL less than 160- Statin therapy is not indicated.  Other orders -     Flu Vaccine QUAD 6+ mos PF IM (Fluarix Quad PF) -     Tdap vaccine greater than or equal to 7yo IM -     LDL cholesterol, direct  I have discontinued Chinedum Vandyne's cyclobenzaprine and ibuprofen. I am also having him start on triamterene-hydrochlorothiazide and Cholecalciferol. Additionally, I am having him maintain his rosuvastatin and scopolamine.  Meds ordered this encounter  Medications   triamterene-hydrochlorothiazide (DYAZIDE) 37.5-25 MG capsule    Sig: Take 1 each (1 capsule total) by mouth daily.    Dispense:  90 capsule    Refill:  0   Cholecalciferol 1.25 MG (50000 UT) capsule    Sig: Take 1 capsule (50,000 Units total) by mouth once a week.    Dispense:  12 capsule    Refill:  0      Follow-up: Return in about 6 weeks (around 05/13/2021).  Sanda Linger, MD

## 2021-04-02 ENCOUNTER — Other Ambulatory Visit: Payer: Self-pay

## 2021-04-02 ENCOUNTER — Emergency Department (HOSPITAL_COMMUNITY): Payer: 59

## 2021-04-02 ENCOUNTER — Emergency Department (HOSPITAL_COMMUNITY)
Admission: EM | Admit: 2021-04-02 | Discharge: 2021-04-02 | Disposition: A | Discharge status: Home / Self Care | Payer: 59 | Attending: Emergency Medicine | Admitting: Emergency Medicine

## 2021-04-02 DIAGNOSIS — Z79899 Other long term (current) drug therapy: Secondary | ICD-10-CM | POA: Diagnosis not present

## 2021-04-02 DIAGNOSIS — I10 Essential (primary) hypertension: Secondary | ICD-10-CM | POA: Insufficient documentation

## 2021-04-02 DIAGNOSIS — R202 Paresthesia of skin: Secondary | ICD-10-CM | POA: Insufficient documentation

## 2021-04-02 DIAGNOSIS — I160 Hypertensive urgency: Secondary | ICD-10-CM

## 2021-04-02 LAB — CBC WITH DIFFERENTIAL/PLATELET
Abs Immature Granulocytes: 0.02 10*3/uL (ref 0.00–0.07)
Basophils Absolute: 0 10*3/uL (ref 0.0–0.1)
Basophils Relative: 0 %
Eosinophils Absolute: 0.2 10*3/uL (ref 0.0–0.5)
Eosinophils Relative: 2 %
HCT: 47.4 % (ref 39.0–52.0)
Hemoglobin: 16.4 g/dL (ref 13.0–17.0)
Immature Granulocytes: 0 %
Lymphocytes Relative: 24 %
Lymphs Abs: 1.8 10*3/uL (ref 0.7–4.0)
MCH: 32.3 pg (ref 26.0–34.0)
MCHC: 34.6 g/dL (ref 30.0–36.0)
MCV: 93.3 fL (ref 80.0–100.0)
Monocytes Absolute: 0.7 10*3/uL (ref 0.1–1.0)
Monocytes Relative: 9 %
Neutro Abs: 4.9 10*3/uL (ref 1.7–7.7)
Neutrophils Relative %: 65 %
Platelets: 182 10*3/uL (ref 150–400)
RBC: 5.08 MIL/uL (ref 4.22–5.81)
RDW: 12.1 % (ref 11.5–15.5)
WBC: 7.7 10*3/uL (ref 4.0–10.5)
nRBC: 0 % (ref 0.0–0.2)

## 2021-04-02 MED ORDER — AMLODIPINE BESYLATE 5 MG PO TABS
5.0000 mg | ORAL_TABLET | Freq: Once | ORAL | Status: AC
  Administered 2021-04-02: 5 mg via ORAL
  Filled 2021-04-02: qty 1

## 2021-04-02 MED ORDER — SODIUM CHLORIDE 0.9 % IV BOLUS
500.0000 mL | Freq: Once | INTRAVENOUS | Status: AC
  Administered 2021-04-02: 500 mL via INTRAVENOUS

## 2021-04-02 MED ORDER — IBUPROFEN 400 MG PO TABS
600.0000 mg | ORAL_TABLET | Freq: Once | ORAL | Status: AC
  Administered 2021-04-02: 600 mg via ORAL
  Filled 2021-04-02: qty 1

## 2021-04-02 NOTE — ED Provider Notes (Signed)
[facility] EMERGENCY DEPARTMENT Provider Note   CSN: [id number] Arrival date & time: 04/02/21  0855     History Chief Complaint  Patient presents with   Hypertension    Wayne Morales is a 50 y.o. male.  HPI 50 year old male presents with hypertension, blurred vision, tingling in the fingertips.  This started around 7:30 AM.  When he first woke up at 6 AM he had a slight headache.  Took his blood pressure and it was around 140 systolic.  He just saw his doctor yesterday and it sounds like they are considering starting antihypertensives.  His blood pressure yesterday was documented at 158/98.  On his way to work he developed a worsening headache, blurred vision that felt like he was not wearing his glasses even though he was, and tingling in the fingertips.  [place] it to the [place] works and he checked his blood pressure and it was around 220 systolic.  He is not on any BP meds.  EMS was called and is symptoms have gradually improved.  He still has a residual headache that is about a 3 out of 10, down from a 7 out of 10.  Blurred vision and tingling is better.  Never had chest pain, shortness of breath, focal weakness.  Past Medical History:  Diagnosis Date   Chronic back pain    Hyperlipidemia    Scalp psoriasis 12/15/2012    Patient Active Problem List   Diagnosis Date Noted   Encounter for general adult medical examination with abnormal findings 04/01/2021   Vitamin D deficiency disease 04/01/2021   Colon cancer screening 04/01/2021   Need for hepatitis C screening test 04/01/2021   Motion sickness 02/18/2021   Pure hyperglyceridemia 09/09/2017   Routine general medical examination at a health care facility 09/07/2017   Hyperglycemia 09/07/2017   Onychomycosis of great toe 09/07/2017   Protrusion of lumbar intervertebral disc    Essential hypertension 07/08/2014   Scalp psoriasis 12/15/2012   Hyperlipidemia with target LDL less than 160  07/08/2008    Past Surgical History:  Procedure Laterality Date   HERNIA REPAIR     when was a child       Family History  Problem Relation Age of Onset   Diabetes Other    Hyperlipidemia Other    Hyperlipidemia Mother    Early death Neg Hx    Kidney disease Neg Hx    Hypertension Neg Hx    Heart disease Neg Hx    Stroke Neg Hx     Social History   Tobacco Use   Smoking status: Never   Smokeless tobacco: Never  Substance Use Topics   Alcohol use: No   Drug use: No    Home Medications Prior to Admission medications   Medication Sig Start Date End Date Taking? Authorizing Provider  Cholecalciferol 1.25 MG (50000 UT) capsule Take 1 capsule (50,000 Units total) by mouth once a week. 04/01/21   Etta Grandchild, MD  rosuvastatin (CRESTOR) 20 MG tablet Take 1 tablet (20 mg total) by mouth daily. Must keep scheduled appt for future refills 10/09/19   Etta Grandchild, MD  scopolamine (TRANSDERM-SCOP, 1.5 MG,) 1 MG/3DAYS Place 1 patch (1.5 mg total) onto the skin every 3 (three) days. 02/18/21   Corwin Levins, MD  triamterene-hydrochlorothiazide (DYAZIDE) 37.5-25 MG capsule Take 1 each (1 capsule total) by mouth daily. 04/01/21   Etta Grandchild, MD    Allergies    Penicillins  Review of Systems   Review of Systems  Eyes:  Positive for visual disturbance.  Respiratory:  Negative for shortness of breath.   Cardiovascular:  Negative for chest pain.  Neurological:  Positive for numbness and headaches. Negative for weakness.  All other systems reviewed and are negative.  Physical Exam Updated Vital Signs BP (!) 158/109   Pulse 68   Temp 97.6 F (36.4 C) (Oral)   Resp 12   Ht 5\' 7"  (1.702 m)   Wt 96.2 kg   SpO2 97%   BMI 33.20 kg/m   Physical Exam Vitals and nursing note reviewed.  Constitutional:      General: He is not in acute distress.    Appearance: He is well-developed. He is not ill-appearing or diaphoretic.  HENT:     Head: Normocephalic and atraumatic.      Right Ear: External ear normal.     Left Ear: External ear normal.     Nose: Nose normal.  Eyes:     General:        Right eye: No discharge.        Left eye: No discharge.     Extraocular Movements: Extraocular movements intact.     Pupils: Pupils are equal, round, and reactive to light.     Comments: Normal visual fields  Cardiovascular:     Rate and Rhythm: Normal rate and regular rhythm.     Heart sounds: Normal heart sounds.  Pulmonary:     Effort: Pulmonary effort is normal.     Breath sounds: Normal breath sounds.  Abdominal:     Palpations: Abdomen is soft.     Tenderness: There is no abdominal tenderness.  Musculoskeletal:     Cervical back: Neck supple.  Skin:    General: Skin is warm and dry.  Neurological:     Mental Status: He is alert.     Comments: CN 3-12 grossly intact. 5/5 strength in all 4 extremities. Grossly normal sensation. Normal finger to nose.   Psychiatric:        Mood and Affect: Mood is not anxious.    ED Results / Procedures / Treatments   Labs (all labs ordered are listed, but only abnormal results are displayed) Labs Reviewed  CBC WITH DIFFERENTIAL/PLATELET    EKG EKG Interpretation  Date/Time:  Thursday April 02 2021 09:08:46 EDT Ventricular Rate:  78 PR Interval:  210 QRS Duration: 91 QT Interval:  348 QTC Calculation: 397 R Axis:   33 Text Interpretation: Sinus rhythm Prolonged PR interval nonspecific T waves Confirmed by 04-19-1995 [phone]) on 04/02/2021 10:15:21 AM  Radiology No results found.  Procedures Procedures   Medications Ordered in ED Medications  ibuprofen (ADVIL) tablet 600 mg (has no administration in time range)  sodium chloride 0.9 % bolus 500 mL (0 mLs Intravenous Stopped 04/02/21 1054)  amLODipine (NORVASC) tablet 5 mg (5 mg Oral Given 04/02/21 0944)    ED Course  I have reviewed the triage vital signs and the nursing notes.  Pertinent labs & imaging results that were available during my care  of the patient were reviewed by me and considered in my medical decision making (see chart for details).    MDM Rules/Calculators/A&P                           Given the sudden headache, while not up to a 10/10, CT head was obtained and shows no obvious head bleed/aneurysmal bleed.  I do not think further work-up is needed given lower suspicion for this.  His headache has waxed and waned in the ED but his vision symptoms and tingling have continued to improve.  Given this, I highly doubt stroke, especially with the bilateral hand symptoms.  I do not think an emergent MRI is needed.  His PCP has previously started him on blood pressure medicine but he has not picked it up yet.  He was originally given a dose of amlodipine and can start his home meds when he gets them.  Otherwise, the BMP had to be redrawn and when I discussed with patient we will defer this as he had a BMP yesterday that was unremarkable besides minimal hyperglycemia.  Discharged home with return precautions. Final Clinical Impression(s) / ED Diagnoses Final diagnoses:  Hypertensive urgency    Rx / DC Orders ED Discharge Orders     None        Pricilla Loveless, MD 04/02/21 1154

## 2021-04-02 NOTE — Discharge Instructions (Addendum)
If you develop continued, recurrent, or worsening headache, fever, neck stiffness, vomiting, blurry or double vision, weakness or numbness in your arms or legs, trouble speaking, or any other new/concerning symptoms then return to the ER for evaluation.  

## 2021-04-02 NOTE — ED Triage Notes (Signed)
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

## 2021-04-03 ENCOUNTER — Telehealth: Payer: Self-pay | Admitting: Internal Medicine

## 2021-04-03 NOTE — Telephone Encounter (Signed)
Called pt, LVM informing pt of PCPs response below.

## 2021-04-03 NOTE — Telephone Encounter (Signed)
[facility] FYI.Wayne Morales states he was seen in ER for blood pressure and since he has been home blood pressure is rising, SX or a headache. was seen at md office yesterday and was prescribed BP medications, new diagnosis of HTN. was 230/110 in ER today.BP Readings 165/104, 170/107 Provided patient reference information from [employer] patient asking if there was any interaction between triamterene/HCTZ and amlodipine( was given in ER) and if he was okay to take triamterene tonight.   Advised to see PCP within 3 days. Patient understood and went to ED

## 2021-04-06 ENCOUNTER — Encounter: Payer: Self-pay | Admitting: Gastroenterology

## 2021-04-06 NOTE — Addendum Note (Signed)
Addended by: Etta Grandchild on: 04/06/2021 09:16 AM   Modules accepted: Orders

## 2021-04-08 ENCOUNTER — Encounter: Payer: Self-pay | Admitting: Internal Medicine

## 2021-04-08 ENCOUNTER — Ambulatory Visit: Payer: 59 | Admitting: Internal Medicine

## 2021-04-08 ENCOUNTER — Other Ambulatory Visit: Payer: Self-pay

## 2021-04-08 DIAGNOSIS — I10 Essential (primary) hypertension: Secondary | ICD-10-CM | POA: Diagnosis not present

## 2021-04-08 NOTE — Progress Notes (Signed)
Subjective:  Patient ID: Wayne Morales, male    DOB: 10-04-1970  Age: 50 y.o. MRN: [id number]  CC: Hypertension  This visit occurred during the SARS-CoV-2 public health emergency.  Safety protocols were in place, including screening questions prior to the visit, additional usage of staff PPE, and extensive cleaning of exam room while observing appropriate contact time as indicated for disinfecting solutions.    HPI Wayne Morales presents for f/up -  He was recently seen in the ED for headache and hypertensive urgency.  His CT scan was unremarkable.  He has since been taking triamterene and hydrochlorothiazide and no longer has a headache.  He has mild fatigue but denies blurred vision, chest pain, shortness of breath, diaphoresis, dizziness, lightheadedness, or edema.  Outpatient Medications Prior to Visit  Medication Sig Dispense Refill   Cholecalciferol 1.25 MG (50000 UT) capsule Take 1 capsule (50,000 Units total) by mouth once a week. 12 capsule 0   rosuvastatin (CRESTOR) 20 MG tablet Take 1 tablet (20 mg total) by mouth daily. Must keep scheduled appt for future refills 90 tablet 1   scopolamine (TRANSDERM-SCOP, 1.5 MG,) 1 MG/3DAYS Place 1 patch (1.5 mg total) onto the skin every 3 (three) days. 4 patch 0   triamterene-hydrochlorothiazide (DYAZIDE) 37.5-25 MG capsule Take 1 each (1 capsule total) by mouth daily. 90 capsule 0   No facility-administered medications prior to visit.    ROS Review of Systems  Constitutional:  Positive for fatigue. Negative for diaphoresis and unexpected weight change.  HENT: Negative.    Eyes: Negative.   Respiratory:  Negative for cough, shortness of breath and wheezing.   Cardiovascular:  Negative for chest pain, palpitations and leg swelling.  Gastrointestinal:  Negative for abdominal pain, constipation, diarrhea, nausea and vomiting.  Endocrine: Negative.   Genitourinary: Negative.  Negative for difficulty urinating and hematuria.   Musculoskeletal:  Negative for arthralgias, back pain and myalgias.  Skin: Negative.  Negative for color change.  Neurological:  Negative for dizziness, weakness, light-headedness, numbness and headaches.  Hematological:  Negative for adenopathy. Does not bruise/bleed easily.  Psychiatric/Behavioral: Negative.     Objective:  BP 136/82 (BP Location: Right Arm, Patient Position: Sitting, Cuff Size: Large)   Pulse 95   Temp 98.5 F (36.9 C) (Oral)   Resp 16   Ht 5\' 7"  (1.702 m)   Wt 201 lb 12.8 oz (91.5 kg)   SpO2 98%   BMI 31.61 kg/m   BP Readings from Last 3 Encounters:  04/08/21 136/82  04/02/21 (!) 157/102  04/01/21 (!) 158/98    Wt Readings from Last 3 Encounters:  04/08/21 201 lb 12.8 oz (91.5 kg)  04/02/21 212 lb (96.2 kg)  04/01/21 212 lb (96.2 kg)    Physical Exam Vitals reviewed.  HENT:     Nose: Nose normal.     Mouth/Throat:     Mouth: Mucous membranes are moist.  Eyes:     Conjunctiva/sclera: Conjunctivae normal.  Cardiovascular:     Rate and Rhythm: Normal rate and regular rhythm.     Heart sounds: No murmur heard. Pulmonary:     Effort: Pulmonary effort is normal.     Breath sounds: No stridor. No wheezing, rhonchi or rales.  Abdominal:     General: Abdomen is flat.     Palpations: There is no mass.     Tenderness: There is no abdominal tenderness. There is no guarding.     Hernia: No hernia is present.  Musculoskeletal:  General: Normal range of motion.     Cervical back: Neck supple.     Right lower leg: No edema.     Left lower leg: No edema.  Lymphadenopathy:     Cervical: No cervical adenopathy.  Skin:    General: Skin is warm and dry.  Neurological:     General: No focal deficit present.     Mental Status: He is alert.  Psychiatric:        Mood and Affect: Mood normal.        Behavior: Behavior normal.    Lab Results  Component Value Date   WBC 7.7 04/02/2021   HGB 16.4 04/02/2021   HCT 47.4 04/02/2021   PLT 182  04/02/2021   GLUCOSE 118 (H) 04/01/2021   CHOL 214 (H) 04/01/2021   TRIG (H) 04/01/2021    413.0 Triglyceride is over 400; calculations on Lipids are invalid.   HDL 33.10 (L) 04/01/2021   LDLDIRECT 84.0 04/01/2021   LDLCALC 146 09/25/2019   ALT 32 04/01/2021   AST 25 04/01/2021   NA 139 04/01/2021   K 3.9 04/01/2021   CL 102 04/01/2021   CREATININE 1.25 04/01/2021   BUN 11 04/01/2021   CO2 29 04/01/2021   TSH 0.55 04/01/2021   PSA 0.4 09/25/2019   HGBA1C 5.2 04/01/2021    CT HEAD WO CONTRAST ( )  Result Date: 04/02/2021 CLINICAL DATA:  Headache EXAM: CT HEAD WITHOUT CONTRAST TECHNIQUE: Contiguous axial images were obtained from the base of the skull through the vertex without intravenous contrast. COMPARISON:  None. FINDINGS: Brain: There is no evidence of acute intracranial hemorrhage, extra-axial fluid collection, or infarct. The ventricles are normal in size. There is no mass lesion. There is no midline shift. Vascular: No hyperdense vessel or unexpected calcification. Skull: Normal. Negative for fracture or focal lesion. Sinuses/Orbits: There is a mucous retention cyst in the left maxillary sinus. The globes and orbits are unremarkable. Other: None. IMPRESSION: No acute intracranial pathology. Electronically Signed   By: Lesia Hausen M.D.   On: 04/02/2021 10:24    Assessment & Plan:   Wayne Morales was seen today for hypertension.  Diagnoses and all orders for this visit:  Essential hypertension- His blood pressure is adequately well controlled.  Will continue the combination of triamterene and hydrochlorothiazide.  I am having Wayne Morales maintain his rosuvastatin, scopolamine, triamterene-hydrochlorothiazide, and Cholecalciferol.  No orders of the defined types were placed in this encounter.    Follow-up: Return in about 3 months (around 07/08/2021).  Sanda Linger, MD

## 2021-04-08 NOTE — Patient Instructions (Signed)

## 2021-04-09 LAB — ALDOSTERONE + RENIN ACTIVITY W/ RATIO
ALDO / PRA Ratio: 2.1 Ratio (ref 0.9–28.9)
Aldosterone: 1 ng/dL
Renin Activity: 0.48 ng/mL/h (ref 0.25–5.82)

## 2021-04-09 LAB — HEPATITIS C ANTIBODY
Hepatitis C Ab: NONREACTIVE
SIGNAL TO CUT-OFF: 0.01 (ref <1.00)

## 2021-05-21 ENCOUNTER — Ambulatory Visit (AMBULATORY_SURGERY_CENTER): Payer: Self-pay

## 2021-05-21 ENCOUNTER — Other Ambulatory Visit: Payer: Self-pay

## 2021-05-21 VITALS — Ht 67.0 in | Wt 200.0 lb

## 2021-05-21 DIAGNOSIS — Z1211 Encounter for screening for malignant neoplasm of colon: Secondary | ICD-10-CM

## 2021-05-21 MED ORDER — CLENPIQ 10-3.5-12 MG-GM -GM/160ML PO SOLN: 1.0000 | ORAL | 0 refills | Status: DC

## 2021-05-21 NOTE — Progress Notes (Signed)
No egg or soy allergy known to patient  No issues known to pt with past sedation with any surgeries or procedures Patient denies ever being told they had issues or difficulty with intubation  No FH of Malignant Hyperthermia Pt is not on diet pills Pt is not on home 02  Pt is not on blood thinners  Pt denies issues with constipation  No A fib or A flutter Pt is fully vaccinated for Covid x 2; NO PA's for preps discussed with pt in [facility] today  Discussed with pt there will be an out-of-pocket cost for prep and that varies from $0 to 70 +  dollars - pt verbalized understanding  Due to the COVID-19 pandemic we are asking patients to follow certain guidelines in PV and the [facility]   Pt aware of COVID protocols and [facility] guidelines   Patient given all options of preps- patient chose Clenpiq due to having a coupon and less amount of fluids- advised to call back as soon as possible if prep is not covered by insurance and/or coupon does not work

## 2021-05-22 ENCOUNTER — Encounter: Payer: Self-pay | Admitting: Gastroenterology

## 2021-05-25 ENCOUNTER — Other Ambulatory Visit: Payer: Self-pay | Admitting: Internal Medicine

## 2021-05-25 ENCOUNTER — Encounter: Payer: Self-pay | Admitting: Internal Medicine

## 2021-06-02 ENCOUNTER — Other Ambulatory Visit: Payer: Self-pay

## 2021-06-02 ENCOUNTER — Encounter: Payer: Self-pay | Admitting: Gastroenterology

## 2021-06-02 ENCOUNTER — Ambulatory Visit (AMBULATORY_SURGERY_CENTER): Payer: 59 | Admitting: Gastroenterology

## 2021-06-02 ENCOUNTER — Other Ambulatory Visit: Payer: Self-pay | Admitting: Internal Medicine

## 2021-06-02 VITALS — BP 132/93 | HR 71 | Temp 97.9°F | Resp 15 | Ht 67.0 in | Wt 200.0 lb

## 2021-06-02 DIAGNOSIS — Z800 Family history of malignant neoplasm of digestive organs: Secondary | ICD-10-CM | POA: Diagnosis not present

## 2021-06-02 DIAGNOSIS — N522 Drug-induced erectile dysfunction: Secondary | ICD-10-CM | POA: Insufficient documentation

## 2021-06-02 DIAGNOSIS — Z1211 Encounter for screening for malignant neoplasm of colon: Secondary | ICD-10-CM

## 2021-06-02 MED ORDER — SODIUM CHLORIDE 0.9 % IV SOLN: 500.0000 mL | Freq: Once | INTRAVENOUS | Status: DC

## 2021-06-02 MED ORDER — VARDENAFIL HCL 20 MG PO TABS: 20.0000 mg | ORAL_TABLET | Freq: Every day | ORAL | 3 refills | Status: DC | PRN

## 2021-06-02 NOTE — Patient Instructions (Signed)
Thank you for allowing [place] to care for you today! Next routine surveillance colonoscopy in 10 years.  Please reach out if any concerns arise before that time.      YOU HAD AN ENDOSCOPIC PROCEDURE TODAY AT THE  ENDOSCOPY CENTER:   Refer to the procedure report that was given to you for any specific questions about what was found during the examination.  If the procedure report does not answer your questions, please call your gastroenterologist to clarify.  If you requested that your care partner not be given the details of your procedure findings, then the procedure report has been included in a sealed envelope for you to review at your convenience later.  YOU SHOULD EXPECT: Some feelings of bloating in the abdomen. Passage of more gas than usual.  Walking can help get rid of the air that was put into your GI tract during the procedure and reduce the bloating. If you had a lower endoscopy (such as a colonoscopy or flexible sigmoidoscopy) you may notice spotting of blood in your stool or on the toilet paper. If you underwent a bowel prep for your procedure, you may not have a normal bowel movement for a few days.  Please Note:  You might notice some irritation and congestion in your nose or some drainage.  This is from the oxygen used during your procedure.  There is no need for concern and it should clear up in a day or so.  SYMPTOMS TO REPORT IMMEDIATELY:  Following lower endoscopy (colonoscopy or flexible sigmoidoscopy):  Excessive amounts of blood in the stool  Significant tenderness or worsening of abdominal pains  Swelling of the abdomen that is new, acute  Fever of 100F or higher   For urgent or emergent issues, a gastroenterologist can be reached at any hour by calling [phone]. Do not use MyChart messaging for urgent concerns.    DIET:  We do recommend a small meal at first, but then you may proceed to your regular diet.  Drink plenty of fluids but you should avoid  alcoholic beverages for 24 hours.  ACTIVITY:  You should plan to take it easy for the rest of today and you should NOT DRIVE or use heavy machinery until tomorrow (because of the sedation medicines used during the test).    FOLLOW UP: Our staff will call the number listed on your records 48-72 hours following your procedure to check on you and address any questions or concerns that you may have regarding the information given to you following your procedure. If we do not reach you, we will leave a message.  We will attempt to reach you two times.  During this call, we will ask if you have developed any symptoms of COVID 19. If you develop any symptoms (ie: fever, flu-like symptoms, shortness of breath, cough etc.) before then, please call [phone].  If you test positive for Covid 19 in the 2 weeks post procedure, please call and report this information to [place].    If any biopsies were taken you will be contacted by phone or by letter within the next 1-3 weeks.  Please call us at [phone] if you have not heard about the biopsies in 3 weeks.    SIGNATURES/CONFIDENTIALITY: You and/or your care partner have signed paperwork which will be entered into your electronic medical record.  These signatures attest to the fact that that the information above on your After Visit Summary has been reviewed and is understood.  Full  responsibility of the confidentiality of this discharge information lies with you and/or your care-partner.  

## 2021-06-02 NOTE — Progress Notes (Signed)
Report to PACU, RN, vss, BBS= Clear.  

## 2021-06-02 NOTE — Progress Notes (Signed)
Pt's states no medical or surgical changes since previsit or office visit. VS assessed by C.W 

## 2021-06-02 NOTE — Op Note (Signed)
[facility] Patient Name: Wayne Morales Procedure Date: 06/02/2021 10:07 AM MRN: [id number] Endoscopist: Lorin Picket E. Tomasa Rand , MD Age: 50 Referring MD:  Date of Birth: 1970-12-17 Gender: Male Account #: [id number] Procedure:                Colonoscopy Indications:              Screening in patient at increased risk: Colorectal                            cancer in father 40 or older Medicines:                Monitored Anesthesia Care Procedure:                Pre-Anesthesia Assessment:                           - Prior to the procedure, a History and Physical                            was performed, and patient medications and                            allergies were reviewed. The patient's tolerance of                            previous anesthesia was also reviewed. The risks                            and benefits of the procedure and the sedation                            options and risks were discussed with the patient.                            All questions were answered, and informed consent                            was obtained. Prior Anticoagulants: The patient has                            taken no previous anticoagulant or antiplatelet                            agents except for aspirin. ASA Grade Assessment: II                            - A patient with mild systemic disease. After                            reviewing the risks and benefits, the patient was                            deemed in satisfactory condition to undergo the  procedure.                           After obtaining informed consent, the colonoscope                            was passed under direct vision. Throughout the                            procedure, the patient's blood pressure, pulse, and                            oxygen saturations were monitored continuously. The                            Olympus CF-HQ190L [id number]) Colonoscope was                             introduced through the anus and advanced to the the                            cecum, identified by appendiceal orifice and                            ileocecal valve. The patient tolerated the                            procedure well. The quality of the bowel                            preparation was good. The ileocecal valve,                            appendiceal orifice, and rectum were photographed.                            The colonoscopy was somewhat difficult due to                            significant looping. Successful completion of the                            procedure was aided by using manual pressure. Scope In: 10:17:43 AM Scope Out: 10:32:01 AM Scope Withdrawal Time: 0 hours 9 minutes 49 seconds  Total Procedure Duration: 0 hours 14 minutes 18 seconds  Findings:                 The perianal and digital rectal examinations were                            normal. Pertinent negatives include normal                            sphincter tone and no palpable rectal lesions.  The colon (entire examined portion) appeared normal.                           Non-bleeding internal hemorrhoids were found during                            retroflexion. The hemorrhoids were Grade I                            (internal hemorrhoids that do not prolapse).                           No additional abnormalities were found on                            retroflexion. Complications:            No immediate complications. Estimated Blood Loss:     Estimated blood loss: none. Impression:               - The entire examined colon is normal.                           - Non-bleeding internal hemorrhoids.                           - No specimens collected. Recommendation:           - Patient has a contact number available for                            emergencies. The signs and symptoms of potential                            delayed complications  were discussed with the                            patient. Return to normal activities tomorrow.                            Written discharge instructions were provided to the                            patient.                           - Resume previous diet.                           - Continue present medications.                           - Repeat colonoscopy in 10 years for screening                            purposes. Fynlee Rowlands E. Tomasa Rand, MD 06/02/2021 10:38:19 AM This report has been signed electronically.

## 2021-06-02 NOTE — Progress Notes (Signed)
[facility] History and Physical   Primary Care Physician:  Etta Grandchild, MD   Reason for Procedure:   Colon cancer screening  Plan:    Screening colonoscopy     HPI: Wayne Morales is a 50 y.o. male undergoing initial average risk screening colonoscopy.  His father was diagnosed with colon cancer in his 88s.  No other family members with colon cancer.  He has no chronic GI symptoms.    Past Medical History:  Diagnosis Date   Chronic back pain    Hyperlipidemia    diet controlled   Hypertension    on meds   Scalp psoriasis 12/15/2012    Past Surgical History:  Procedure Laterality Date   INGUINAL HERNIA REPAIR Left    as a child    Prior to Admission medications   Medication Sig Start Date End Date Taking? Authorizing Provider  Ascorbic Acid (VITAMIN C PO) Take 1,000 mg by mouth daily at 6 (six) AM.   Yes [provider]  Cholecalciferol 1.25 MG (50000 UT) capsule Take 1 capsule (50,000 Units total) by mouth once a week. 04/01/21  Yes Etta Grandchild, MD  ELDERBERRY PO Take 1 tablet by mouth daily at 6 (six) AM.   Yes [provider]  rosuvastatin (CRESTOR) 20 MG tablet Take 1 tablet (20 mg total) by mouth daily. Must keep scheduled appt for future refills 10/09/19  Yes Etta Grandchild, MD  triamterene-hydrochlorothiazide (DYAZIDE) 37.5-25 MG capsule Take 1 each (1 capsule total) by mouth daily. Patient not taking: Reported on 06/02/2021 04/01/21   Etta Grandchild, MD    Current Outpatient Medications  Medication Sig Dispense Refill   Ascorbic Acid (VITAMIN C PO) Take 1,000 mg by mouth daily at 6 (six) AM.     Cholecalciferol 1.25 MG (50000 UT) capsule Take 1 capsule (50,000 Units total) by mouth once a week. 12 capsule 0   ELDERBERRY PO Take 1 tablet by mouth daily at 6 (six) AM.     rosuvastatin (CRESTOR) 20 MG tablet Take 1 tablet (20 mg total) by mouth daily. Must keep scheduled appt for future refills 90 tablet 1    triamterene-hydrochlorothiazide (DYAZIDE) 37.5-25 MG capsule Take 1 each (1 capsule total) by mouth daily. (Patient not taking: Reported on 06/02/2021) 90 capsule 0   Current Facility-Administered Medications  Medication Dose Route Frequency Provider Last Rate Last Admin   0.9 %  sodium chloride infusion  500 mL Intravenous Once Jenel Lucks, MD        Allergies as of 06/02/2021 - Review Complete 06/02/2021  Allergen Reaction Noted   Penicillins Hives and Rash 06/17/2020    Family History  Problem Relation Age of Onset   Hyperlipidemia Mother    Colon polyps Father 74   Colon cancer Father 81   Diabetes Other    Hyperlipidemia Other    Early death Neg Hx    Kidney disease Neg Hx    Hypertension Neg Hx    Heart disease Neg Hx    Stroke Neg Hx    Stomach cancer Neg Hx    Rectal cancer Neg Hx    Esophageal cancer Neg Hx     Social History   Socioeconomic History   Marital status: Married    Spouse name: Not on file   Number of children: Not on file   Years of education: Not on file   Highest education level: Not on file  Occupational History   Not on file  Tobacco Use   Smoking status: Never   Smokeless tobacco: Never  Vaping Use   Vaping Use: Never used  Substance and Sexual Activity   Alcohol use: Yes    Comment: socially   Drug use: Never   Sexual activity: Yes  Other Topics Concern   Not on file  Social History Narrative   Not on file   Social Determinants of Health   Financial Resource Strain: Not on file  Food Insecurity: Not on file  Transportation Needs: Not on file  Physical Activity: Not on file  Stress: Not on file  Social Connections: Not on file  Intimate Partner Violence: Not on file    Review of Systems:  All other review of systems negative except as mentioned in the HPI.  Physical Exam: Vital signs BP 128/60   Pulse 78   Temp 97.9 F (36.6 C) (Skin)   Ht 5\' 7"  (1.702 m)   Wt 200 lb (90.7 kg)   SpO2 97%   BMI 31.32  kg/m   General:   Alert,  Well-developed, well-nourished, pleasant and cooperative in NAD Airway:  Mallampati 1 Lungs:  Clear throughout to auscultation.   Heart:  Regular rate and rhythm; no murmurs, clicks, rubs,  or gallops. Abdomen:  Soft, nontender and nondistended. Normal bowel sounds.   Neuro/Psych:  Normal mood and affect. A and O x 3   Teagan Ozawa E. , MD [facility]

## 2021-06-03 ENCOUNTER — Other Ambulatory Visit: Payer: Self-pay | Admitting: Internal Medicine

## 2021-06-03 DIAGNOSIS — I10 Essential (primary) hypertension: Secondary | ICD-10-CM

## 2021-06-03 MED ORDER — TRIAMTERENE-HCTZ 37.5-25 MG PO CAPS: 1.0000 | ORAL_CAPSULE | Freq: Every day | ORAL | 0 refills | Status: DC

## 2021-06-04 ENCOUNTER — Telehealth: Payer: Self-pay

## 2021-06-04 NOTE — Telephone Encounter (Signed)
No answer, left message to call back later today, B.Marlowe Lawes RN. 

## 2021-06-04 NOTE — Telephone Encounter (Signed)
  Follow up Call-  Call back number 06/02/2021  Post procedure Call Back phone  # [phone]  Permission to leave phone message Yes  Some recent data might be hidden    Left message

## 2021-07-14 ENCOUNTER — Ambulatory Visit (INDEPENDENT_AMBULATORY_CARE_PROVIDER_SITE_OTHER)
Admission: RE | Admit: 2021-07-14 | Discharge: 2021-07-14 | Disposition: A | Discharge status: Home / Self Care | Payer: 59 | Source: Ambulatory Visit | Attending: Internal Medicine | Admitting: Internal Medicine

## 2021-07-14 ENCOUNTER — Other Ambulatory Visit: Payer: Self-pay | Admitting: Internal Medicine

## 2021-07-14 ENCOUNTER — Other Ambulatory Visit: Payer: Self-pay

## 2021-07-14 ENCOUNTER — Telehealth: Payer: Self-pay | Admitting: Internal Medicine

## 2021-07-14 ENCOUNTER — Ambulatory Visit (INDEPENDENT_AMBULATORY_CARE_PROVIDER_SITE_OTHER): Payer: 59 | Admitting: Internal Medicine

## 2021-07-14 ENCOUNTER — Encounter: Payer: Self-pay | Admitting: Internal Medicine

## 2021-07-14 VITALS — BP 142/92 | HR 88 | Temp 98.7°F | Resp 16 | Ht 67.0 in | Wt 207.0 lb

## 2021-07-14 DIAGNOSIS — I10 Essential (primary) hypertension: Secondary | ICD-10-CM

## 2021-07-14 DIAGNOSIS — H93A3 Pulsatile tinnitus, bilateral: Secondary | ICD-10-CM

## 2021-07-14 LAB — BASIC METABOLIC PANEL
BUN: 17 mg/dL (ref 6–23)
CO2: 27 mEq/L (ref 19–32)
Calcium: 10 mg/dL (ref 8.4–10.5)
Chloride: 101 mEq/L (ref 96–112)
Creatinine, Ser: 1.31 mg/dL (ref 0.40–1.50)
GFR: 63.6 mL/min (ref >60.00)
Glucose, Bld: 112 mg/dL — ABNORMAL HIGH (ref 70–99)
Potassium: 3.9 mEq/L (ref 3.5–5.1)
Sodium: 136 mEq/L (ref 135–145)

## 2021-07-14 LAB — CBC WITH DIFFERENTIAL/PLATELET
Basophils Absolute: 0 10*3/uL (ref 0.0–0.1)
Basophils Relative: 0.3 % (ref 0.0–3.0)
Eosinophils Absolute: 0.2 10*3/uL (ref 0.0–0.7)
Eosinophils Relative: 2.9 % (ref 0.0–5.0)
HCT: 46.4 % (ref 39.0–52.0)
Hemoglobin: 15.9 g/dL (ref 13.0–17.0)
Lymphocytes Relative: 31.9 % (ref 12.0–46.0)
Lymphs Abs: 2.1 10*3/uL (ref 0.7–4.0)
MCHC: 34.2 g/dL (ref 30.0–36.0)
MCV: 93.7 fl (ref 78.0–100.0)
Monocytes Absolute: 0.5 10*3/uL (ref 0.1–1.0)
Monocytes Relative: 8 % (ref 3.0–12.0)
Neutro Abs: 3.8 10*3/uL (ref 1.4–7.7)
Neutrophils Relative %: 56.9 % (ref 43.0–77.0)
Platelets: 170 10*3/uL (ref 150.0–400.0)
RBC: 4.96 Mil/uL (ref 4.22–5.81)
RDW: 13 % (ref 11.5–15.5)
WBC: 6.7 10*3/uL (ref 4.0–10.5)

## 2021-07-14 MED ORDER — IOHEXOL 350 MG/ML SOLN
75.0000 mL | Freq: Once | INTRAVENOUS | Status: AC | PRN
  Administered 2021-07-14: 14:00:00 75 mL via INTRAVENOUS

## 2021-07-14 MED ORDER — TRIAMTERENE-HCTZ 37.5-25 MG PO CAPS: 1.0000 | ORAL_CAPSULE | Freq: Every day | ORAL | 0 refills | Status: DC

## 2021-07-14 NOTE — Progress Notes (Signed)
Subjective:  Patient ID: Wayne Morales, male    DOB: Oct 14, 1970  Age: 50 y.o. MRN: [id number]  CC: Hypertension  This visit occurred during the SARS-CoV-2 public health emergency.  Safety protocols were in place, including screening questions prior to the visit, additional usage of staff PPE, and extensive cleaning of exam room while observing appropriate contact time as indicated for disinfecting solutions.    HPI Wayne Morales presents for f/up -  He complains of dizziness when he laughs and a rushing/swooshing sensation in his ears (R>L).  Outpatient Medications Prior to Visit  Medication Sig Dispense Refill   Ascorbic Acid (VITAMIN C PO) Take 1,000 mg by mouth daily at 6 (six) AM.     Cholecalciferol 1.25 MG (50000 UT) capsule Take 1 capsule (50,000 Units total) by mouth once a week. 12 capsule 0   ELDERBERRY PO Take 1 tablet by mouth daily at 6 (six) AM.     rosuvastatin (CRESTOR) 20 MG tablet Take 1 tablet (20 mg total) by mouth daily. Must keep scheduled appt for future refills 90 tablet 1   vardenafil (LEVITRA) 20 MG tablet Take 1 tablet (20 mg total) by mouth daily as needed for erectile dysfunction. 10 tablet 3   triamterene-hydrochlorothiazide (DYAZIDE) 37.5-25 MG capsule Take 1 each (1 capsule total) by mouth daily. 90 capsule 0   No facility-administered medications prior to visit.    ROS Review of Systems  Constitutional:  Negative for diaphoresis and fatigue.  HENT:  Positive for tinnitus. Negative for ear pain, hearing loss and trouble swallowing.   Eyes:  Negative for visual disturbance.  Respiratory:  Negative for cough, chest tightness, shortness of breath and wheezing.   Cardiovascular:  Negative for chest pain, palpitations and leg swelling.  Gastrointestinal:  Negative for abdominal pain, diarrhea, nausea and vomiting.  Genitourinary:  Negative for difficulty urinating and hematuria.  Musculoskeletal:  Positive for neck pain. Negative for back pain.   Skin: Negative.   Neurological:  Positive for dizziness. Negative for syncope, speech difficulty, weakness, light-headedness, numbness and headaches.  Hematological:  Negative for adenopathy. Does not bruise/bleed easily.   Objective:  BP (!) 142/92 (BP Location: Left Arm, Patient Position: Sitting, Cuff Size: Large)    Pulse 88    Temp 98.7 F (37.1 C) (Oral)    Resp 16    Ht 5\' 7"  (1.702 m)    Wt 207 lb (93.9 kg)    SpO2 97%    BMI 32.42 kg/m   BP Readings from Last 3 Encounters:  07/14/21 (!) 142/92  06/02/21 (!) 132/93  04/08/21 136/82    Wt Readings from Last 3 Encounters:  07/14/21 207 lb (93.9 kg)  06/02/21 200 lb (90.7 kg)  05/21/21 200 lb (90.7 kg)    Physical Exam Vitals reviewed.  Constitutional:      Appearance: Normal appearance.  HENT:     Nose: Nose normal.     Mouth/Throat:     Mouth: Mucous membranes are moist.     Pharynx: No posterior oropharyngeal erythema.  Eyes:     General: No scleral icterus.    Pupils: Pupils are equal, round, and reactive to light.  Neck:     Thyroid: No thyroid mass, thyromegaly or thyroid tenderness.     Vascular: No carotid bruit or JVD.     Trachea: Trachea normal.  Cardiovascular:     Rate and Rhythm: Normal rate and regular rhythm.     Heart sounds: No murmur heard. Pulmonary:  Effort: Pulmonary effort is normal.     Breath sounds: No stridor. No wheezing, rhonchi or rales.  Abdominal:     General: Abdomen is flat.     Palpations: There is no mass.     Tenderness: There is no abdominal tenderness. There is no guarding.     Hernia: No hernia is present.  Musculoskeletal:        General: Normal range of motion.     Cervical back: Neck supple.     Right lower leg: No edema.     Left lower leg: No edema.  Lymphadenopathy:     Cervical: No cervical adenopathy.  Skin:    General: Skin is warm and dry.     Findings: No rash.  Neurological:     General: No focal deficit present.     Mental Status: He is alert  and oriented to person, place, and time. Mental status is at baseline.  Psychiatric:        Mood and Affect: Mood normal.        Behavior: Behavior normal.    Lab Results  Component Value Date   WBC 6.7 07/14/2021   HGB 15.9 07/14/2021   HCT 46.4 07/14/2021   PLT 170.0 07/14/2021   GLUCOSE 112 (H) 07/14/2021   CHOL 214 (H) 04/01/2021   TRIG (H) 04/01/2021    413.0 Triglyceride is over 400; calculations on Lipids are invalid.   HDL 33.10 (L) 04/01/2021   LDLDIRECT 84.0 04/01/2021   LDLCALC 146 09/25/2019   ALT 32 04/01/2021   AST 25 04/01/2021   NA 136 07/14/2021   K 3.9 07/14/2021   CL 101 07/14/2021   CREATININE 1.31 07/14/2021   BUN 17 07/14/2021   CO2 27 07/14/2021   TSH 0.55 04/01/2021   PSA 0.4 09/25/2019   HGBA1C 5.2 04/01/2021    CT HEAD WO CONTRAST (5MM)  Result Date: 04/02/2021 CLINICAL DATA:  Headache EXAM: CT HEAD WITHOUT CONTRAST TECHNIQUE: Contiguous axial images were obtained from the base of the skull through the vertex without intravenous contrast. COMPARISON:  None. FINDINGS: Brain: There is no evidence of acute intracranial hemorrhage, extra-axial fluid collection, or infarct. The ventricles are normal in size. There is no mass lesion. There is no midline shift. Vascular: No hyperdense vessel or unexpected calcification. Skull: Normal. Negative for fracture or focal lesion. Sinuses/Orbits: There is a mucous retention cyst in the left maxillary sinus. The globes and orbits are unremarkable. Other: None. IMPRESSION: No acute intracranial pathology. Electronically Signed   By: Valetta Mole M.D.   On: 04/02/2021 10:24    Assessment & Plan:   Flora was seen today for hypertension.  Diagnoses and all orders for this visit:  Pulsatile tinnitus of both ears- Will screen for arterial anomalies (aneurysms, AVMs). -     Cancel: CT ANGIO HEAD NECK W WO CM; Future  Essential hypertension - He has not achieved his BP goal of 130/80. He agrees to improve his  lifestyle modifications. -     Basic metabolic panel; Future -     CBC with Differential/Platelet; Future -     CBC with Differential/Platelet -     Basic metabolic panel   I am having Makaden Garavaglia maintain his rosuvastatin, Cholecalciferol, Ascorbic Acid (VITAMIN C PO), ELDERBERRY PO, and vardenafil.  No orders of the defined types were placed in this encounter.    Follow-up: Return in about 3 months (around 10/12/2021).  Scarlette Calico, MD

## 2021-07-14 NOTE — Telephone Encounter (Signed)
1.Medication Requested: Rosuvastatin : Triamterene-hydrochlorothiazide   2. Pharmacy (Name, Street, [place]): [facility];  [address]  [place]  3. On Med List: yes  4. Last Visit with PCP: 12.27.22  5. Next visit date with PCP: NA   Agent: Please be advised that RX refills may take up to 3 business days. We ask that you follow-up with your pharmacy.

## 2021-07-14 NOTE — Patient Instructions (Signed)

## 2021-07-15 ENCOUNTER — Inpatient Hospital Stay: Admission: RE | Admit: 2021-07-15 | Payer: 59 | Source: Ambulatory Visit

## 2021-07-27 ENCOUNTER — Encounter: Payer: Self-pay | Admitting: Internal Medicine

## 2021-07-27 DIAGNOSIS — E785 Hyperlipidemia, unspecified: Secondary | ICD-10-CM

## 2021-07-28 MED ORDER — ROSUVASTATIN CALCIUM 20 MG PO TABS: 20.0000 mg | ORAL_TABLET | Freq: Every day | ORAL | 1 refills | Status: DC

## 2021-08-06 ENCOUNTER — Telehealth: Payer: 59 | Admitting: Nurse Practitioner

## 2021-08-06 DIAGNOSIS — J069 Acute upper respiratory infection, unspecified: Secondary | ICD-10-CM | POA: Diagnosis not present

## 2021-08-06 NOTE — Progress Notes (Signed)
Virtual Visit Consent   Wayne Morales, you are scheduled for a virtual visit with a [facility] provider today.     Just as with appointments in the office, your consent must be obtained to participate.  Your consent will be active for this visit and any virtual visit you may have with one of our providers in the next 365 days.     If you have a MyChart account, a copy of this consent can be sent to you electronically.  All virtual visits are billed to your insurance company just like a traditional visit in the office.    As this is a virtual visit, video technology does not allow for your provider to perform a traditional examination.  This may limit your provider's ability to fully assess your condition.  If your provider identifies any concerns that need to be evaluated in person or the need to arrange testing (such as labs, EKG, etc.), we will make arrangements to do so.     Although advances in technology are sophisticated, we cannot ensure that it will always work on either your end or our end.  If the connection with a video visit is poor, the visit may have to be switched to a telephone visit.  With either a video or telephone visit, we are not always able to ensure that we have a secure connection.     I need to obtain your verbal consent now.   Are you willing to proceed with your visit today?    Wayne Morales has provided verbal consent on 08/06/2021 for a virtual visit (video or telephone).   Wayne Simas, FNP   Date: 08/06/2021 4:00 PM   Virtual Visit via Video Note   I, Wayne Morales, connected with  Wayne Morales  ([id number], September 17, 1970) on 08/06/21 at  4:00 PM EST by a video-enabled telemedicine application and verified that I am speaking with the correct person using two identifiers.  Location: Patient: Virtual Visit Location Patient: Home Provider: Virtual Visit Location Provider: Home Office   I discussed the limitations of evaluation and management by  telemedicine and the availability of in person appointments. The patient expressed understanding and agreed to proceed.    History of Present Illness: Wayne Morales is a 51 y.o. who identifies as a male who was assigned male at birth, and is being seen today with complaints of nasal congestion, sore throat, ear pressure and body aches. Denies a fever.  He has taken a home COVID test twice that was negative.  He was exposed to someone at work that had similar symptoms without any diagnosis of COVID or flu.   He has been using Mucinex over the counter for relief.   Denies a history of asthma.   He has had COVID in the past most recently was 03/2021 Denies any complications.   He has been vaccinated for COVID  He has also had a flu vaccine this year.    Problems:  Patient Active Problem List   Diagnosis Date Noted   Pulsatile tinnitus of both ears 07/14/2021   Drug-induced erectile dysfunction 06/02/2021   Encounter for general adult medical examination with abnormal findings 04/01/2021   Vitamin D deficiency disease 04/01/2021   Colon cancer screening 04/01/2021   Motion sickness 02/18/2021   Pure hyperglyceridemia 09/09/2017   Routine general medical examination at a health care facility 09/07/2017   Onychomycosis of great toe 09/07/2017   Protrusion of lumbar intervertebral disc    Essential hypertension 07/08/2014  Scalp psoriasis 12/15/2012   Hyperlipidemia with target LDL less than 160 07/08/2008    Allergies:  Allergies  Allergen Reactions   Penicillins Hives and Rash    REACTION: Rash   Medications:  Current Outpatient Medications:    Ascorbic Acid (VITAMIN C PO), Take 1,000 mg by mouth daily at 6 (six) AM., Disp: , Rfl:    Cholecalciferol 1.25 MG (50000 UT) capsule, Take 1 capsule (50,000 Units total) by mouth once a week., Disp: 12 capsule, Rfl: 0   ELDERBERRY PO, Take 1 tablet by mouth daily at 6 (six) AM., Disp: , Rfl:    rosuvastatin (CRESTOR) 20 MG tablet,  Take 1 tablet (20 mg total) by mouth daily. Must keep scheduled appt for future refills, Disp: 90 tablet, Rfl: 1   triamterene-hydrochlorothiazide (DYAZIDE) 37.5-25 MG capsule, Take 1 each (1 capsule total) by mouth daily., Disp: 90 capsule, Rfl: 0   vardenafil (LEVITRA) 20 MG tablet, Take 1 tablet (20 mg total) by mouth daily as needed for erectile dysfunction., Disp: 10 tablet, Rfl: 3  Observations/Objective: Patient is well-developed, well-nourished in no acute distress.  Resting comfortably at home.  Head is normocephalic, atraumatic.  No labored breathing.  Speech is clear and coherent with logical content.  Patient is alert and oriented at baseline.    Assessment and Plan: 1. Viral upper respiratory tract infection Advised to continue over the counter medications for relief of symptoms May use Mucinex as needed  Afrin for up to 48 hours  Flonase daily  Push fluids/ rest   Vitamin C for immune support   Follow up if symptoms persist as discussed      Follow Up Instructions: I discussed the assessment and treatment plan with the patient. The patient was provided an opportunity to ask questions and all were answered. The patient agreed with the plan and demonstrated an understanding of the instructions.  A copy of instructions were sent to the patient via MyChart unless otherwise noted below.    The patient was advised to call back or seek an in-person evaluation if the symptoms worsen or if the condition fails to improve as anticipated.  Time:  I spent 10 minutes with the patient via telehealth technology discussing the above problems/concerns.    Wayne Simas, FNP

## 2021-10-22 ENCOUNTER — Other Ambulatory Visit: Payer: Self-pay

## 2021-10-22 ENCOUNTER — Emergency Department (HOSPITAL_COMMUNITY)
Admission: EM | Admit: 2021-10-22 | Discharge: 2021-10-23 | Disposition: A | Discharge status: Home / Self Care | Payer: 59 | Attending: Emergency Medicine | Admitting: Emergency Medicine

## 2021-10-22 ENCOUNTER — Encounter (HOSPITAL_COMMUNITY): Payer: Self-pay | Admitting: Emergency Medicine

## 2021-10-22 ENCOUNTER — Emergency Department (HOSPITAL_COMMUNITY): Payer: 59

## 2021-10-22 DIAGNOSIS — I10 Essential (primary) hypertension: Secondary | ICD-10-CM | POA: Diagnosis not present

## 2021-10-22 DIAGNOSIS — Z20822 Contact with and (suspected) exposure to covid-19: Secondary | ICD-10-CM | POA: Insufficient documentation

## 2021-10-22 DIAGNOSIS — R29810 Facial weakness: Secondary | ICD-10-CM | POA: Diagnosis not present

## 2021-10-22 DIAGNOSIS — R0602 Shortness of breath: Secondary | ICD-10-CM

## 2021-10-22 DIAGNOSIS — R2990 Unspecified symptoms and signs involving the nervous system: Secondary | ICD-10-CM | POA: Diagnosis not present

## 2021-10-22 DIAGNOSIS — Z79899 Other long term (current) drug therapy: Secondary | ICD-10-CM | POA: Insufficient documentation

## 2021-10-22 LAB — CBC
HCT: 44.8 % (ref 39.0–52.0)
Hemoglobin: 15.5 g/dL (ref 13.0–17.0)
MCH: 32.2 pg (ref 26.0–34.0)
MCHC: 34.6 g/dL (ref 30.0–36.0)
MCV: 93.1 fL (ref 80.0–100.0)
Platelets: 166 10*3/uL (ref 150–400)
RBC: 4.81 MIL/uL (ref 4.22–5.81)
RDW: 11.9 % (ref 11.5–15.5)
WBC: 9.1 10*3/uL (ref 4.0–10.5)
nRBC: 0 % (ref 0.0–0.2)

## 2021-10-22 LAB — COMPREHENSIVE METABOLIC PANEL
ALT: 36 U/L (ref 0–44)
AST: 30 U/L (ref 15–41)
Albumin: 4.4 g/dL (ref 3.5–5.0)
Alkaline Phosphatase: 78 U/L (ref 38–126)
Anion gap: 9 (ref 5–15)
BUN: 22 mg/dL — ABNORMAL HIGH (ref 6–20)
CO2: 22 mmol/L (ref 22–32)
Calcium: 9.5 mg/dL (ref 8.9–10.3)
Chloride: 109 mmol/L (ref 98–111)
Creatinine, Ser: 1.28 mg/dL — ABNORMAL HIGH (ref 0.61–1.24)
GFR, Estimated: >60 mL/min (ref >60)
Glucose, Bld: 101 mg/dL — ABNORMAL HIGH (ref 70–99)
Potassium: 3.5 mmol/L (ref 3.5–5.1)
Sodium: 140 mmol/L (ref 135–145)
Total Bilirubin: 0.7 mg/dL (ref 0.3–1.2)
Total Protein: 7 g/dL (ref 6.5–8.1)

## 2021-10-22 LAB — I-STAT CHEM 8, ED
BUN: 23 mg/dL — ABNORMAL HIGH (ref 6–20)
Calcium, Ion: 1.18 mmol/L (ref 1.15–1.40)
Chloride: 107 mmol/L (ref 98–111)
Creatinine, Ser: 1.3 mg/dL — ABNORMAL HIGH (ref 0.61–1.24)
Glucose, Bld: 101 mg/dL — ABNORMAL HIGH (ref 70–99)
HCT: 47 % (ref 39.0–52.0)
Hemoglobin: 16 g/dL (ref 13.0–17.0)
Potassium: 3.4 mmol/L — ABNORMAL LOW (ref 3.5–5.1)
Sodium: 143 mmol/L (ref 135–145)
TCO2: 25 mmol/L (ref 22–32)

## 2021-10-22 LAB — DIFFERENTIAL
Abs Immature Granulocytes: 0.03 10*3/uL (ref 0.00–0.07)
Basophils Absolute: 0 10*3/uL (ref 0.0–0.1)
Basophils Relative: 0 %
Eosinophils Absolute: 0.3 10*3/uL (ref 0.0–0.5)
Eosinophils Relative: 3 %
Immature Granulocytes: 0 %
Lymphocytes Relative: 34 %
Lymphs Abs: 3.1 10*3/uL (ref 0.7–4.0)
Monocytes Absolute: 0.8 10*3/uL (ref 0.1–1.0)
Monocytes Relative: 9 %
Neutro Abs: 4.8 10*3/uL (ref 1.7–7.7)
Neutrophils Relative %: 54 %

## 2021-10-22 LAB — PROTIME-INR
INR: 0.9 (ref 0.8–1.2)
Prothrombin Time: 12.1 seconds (ref 11.4–15.2)

## 2021-10-22 LAB — ETHANOL: Alcohol, Ethyl (B): <10 mg/dL (ref <10)

## 2021-10-22 LAB — RESP PANEL BY RT-PCR (FLU A&B, COVID) ARPGX2
Influenza A by PCR: NEGATIVE
Influenza B by PCR: NEGATIVE
SARS Coronavirus 2 by RT PCR: NEGATIVE

## 2021-10-22 LAB — TROPONIN I (HIGH SENSITIVITY): Troponin I (High Sensitivity): 4 ng/L (ref <18)

## 2021-10-22 LAB — APTT: aPTT: 30 seconds (ref 24–36)

## 2021-10-22 LAB — CBG MONITORING, ED: Glucose-Capillary: 98 mg/dL (ref 70–99)

## 2021-10-22 MED ORDER — IOHEXOL 350 MG/ML SOLN
75.0000 mL | Freq: Once | INTRAVENOUS | Status: AC | PRN
  Administered 2021-10-22: 75 mL via INTRAVENOUS

## 2021-10-22 NOTE — ED Notes (Signed)
Pt reports no complaints at this time. No deficits noted.  ?

## 2021-10-22 NOTE — Code Documentation (Signed)
Stroke Response Nurse Documentation ?Code Documentation ? ?Wayne Morales is a 51 y.o. male arriving to [facility]  via [facility] on 4/6 with past medical hx of HTN, HLD. On No antithrombotic. Code stroke was activated by EMS.  ? ?Patient from home where he was LKW at 2030 and now complaining of right facial droop. ? ?Stroke team at the bedside on patient arrival. Labs drawn and patient cleared for CT by Dr. Roslynn Amble. Patient to CT with team. NIHSS 0, see documentation for details and code stroke times. Patient with without neurological deficit on exam. The following imaging was completed:  CT Head and CTA. Patient is not a candidate for IV Thrombolytic due to no fixed neurological deficit. Patient is not not a candidate for IR due to No LVO.  ? ?Care Plan: MRI.  ? ?Bedside handoff with ED RN Stanton Kidney.   ? ?Madelynn Done  ?Rapid Response RN ? ? ?

## 2021-10-22 NOTE — ED Provider Notes (Signed)
11:32 PM ?Assumed care from Dr. Stevie Kern, please see their note for full history, physical and decision making until this point. In brief this is a 51 y.o. year old male who presented to the ED tonight with Code Stroke ?    ?Reportedly upset, found himself to by hypertensive, called EMS, they were concerned for stroke vs stemi, activated stroke. Now he is fine. No obvious deficits. Pending MRI and repeat troponin.  ? ?Labs/MRI reassuring. Ran it by neurology, feels he is stable for discharge.  ? ?Discharge instructions, including strict return precautions for new or worsening symptoms, given. Patient and/or family verbalized understanding and agreement with the plan as described.  ? ?Labs, studies and imaging reviewed by myself and considered in medical decision making if ordered. Imaging interpreted by radiology. ? ?Labs Reviewed  ?COMPREHENSIVE METABOLIC PANEL - Abnormal; Notable for the following components:  ?    Result Value  ? Glucose, Bld 101 (*)   ? BUN 22 (*)   ? Creatinine, Ser 1.28 (*)   ? All other components within normal limits  ?URINALYSIS, ROUTINE W REFLEX MICROSCOPIC - Abnormal; Notable for the following components:  ? Specific Gravity, Urine 1.035 (*)   ? All other components within normal limits  ?I-STAT CHEM 8, ED - Abnormal; Notable for the following components:  ? Potassium 3.4 (*)   ? BUN 23 (*)   ? Creatinine, Ser 1.30 (*)   ? Glucose, Bld 101 (*)   ? All other components within normal limits  ?RESP PANEL BY RT-PCR (FLU A&B, COVID) ARPGX2  ?ETHANOL  ?PROTIME-INR  ?APTT  ?CBC  ?DIFFERENTIAL  ?RAPID URINE DRUG SCREEN, HOSP PERFORMED  ?CBG MONITORING, ED  ?TROPONIN I (HIGH SENSITIVITY)  ?TROPONIN I (HIGH SENSITIVITY)  ? ? ?CT HEAD CODE STROKE WO CONTRAST  ?Final Result  ?  ?CT ANGIO HEAD NECK W WO CM (CODE STROKE)  ?Final Result  ?  ?MR BRAIN WO CONTRAST    (Results Pending)  ? ? ?No follow-ups on file. ? ?  ?Marily Memos, MD ?10/23/21 4580 ? ?

## 2021-10-22 NOTE — Consult Note (Signed)
Neurology Consultation ? ?Reason for Consult: Code stroke-dizziness, right facial droop ?Referring Physician: Dr. Stevie Kern ? ?CC: Dizziness, right facial droop ? ?History is obtained from: Patient, family, EMS ? ?HPI: Wayne Morales is a 51 y.o. male past medical history of hypertension not very compliant with medications, hyperlipidemia, presented to the emergency room for evaluation of sudden onset of dizziness that started around 9 PM suddenly.  He had sudden onset of dizziness when he was trying to get up to a standing position from sitting.  EMS noted right-sided facial droop during transport and activated a code stroke.  Initial EKG had some concern for ST elevation but that seemed to be artifactual. ?Patient has had prior presentations with elevated blood pressures for hypertensive urgency. ?Compliant with statins. ?On arrival, NIH stroke scale 0 ?Initial blood pressure for EMS in the 200s. ?Denies fevers chills chest pain shortness of breath nausea vomiting.  Denies tingling numbness visual symptoms or headache. ? ?LKW: 2100 hrs. on 10/22/2021 ?tpa given?: no, NIH 0 ?Premorbid modified Rankin scale (mRS):0 ? ? ?ROS: Full ROS was performed and is negative except as noted in the HPI.  ? ?Past Medical History:  ?Diagnosis Date  ? Chronic back pain   ? Hyperlipidemia   ? diet controlled  ? Hypertension   ? on meds  ? Scalp psoriasis 12/15/2012  ? ? ? ?  ? ?Family History  ?Problem Relation Age of Onset  ? Hyperlipidemia Mother   ? Colon polyps Father 61  ? Colon cancer Father 76  ? Diabetes Other   ? Hyperlipidemia Other   ? Early death Neg Hx   ? Kidney disease Neg Hx   ? Hypertension Neg Hx   ? Heart disease Neg Hx   ? Stroke Neg Hx   ? Stomach cancer Neg Hx   ? Rectal cancer Neg Hx   ? Esophageal cancer Neg Hx   ? ? ? ?Social History:  ? reports that he has never smoked. He has never used smokeless tobacco. He reports current alcohol use. He reports that he does not use drugs. ? ?Medications ?No current  facility-administered medications for this encounter. ? ?Current Outpatient Medications:  ?  Ascorbic Acid (VITAMIN C PO), Take 1,000 mg by mouth daily at 6 (six) AM., Disp: , Rfl:  ?  Cholecalciferol 1.25 MG (50000 UT) capsule, Take 1 capsule (50,000 Units total) by mouth once a week., Disp: 12 capsule, Rfl: 0 ?  ELDERBERRY PO, Take 1 tablet by mouth daily at 6 (six) AM., Disp: , Rfl:  ?  rosuvastatin (CRESTOR) 20 MG tablet, Take 1 tablet (20 mg total) by mouth daily. Must keep scheduled appt for future refills, Disp: 90 tablet, Rfl: 1 ?  triamterene-hydrochlorothiazide (DYAZIDE) 37.5-25 MG capsule, Take 1 each (1 capsule total) by mouth daily., Disp: 90 capsule, Rfl: 0 ?  vardenafil (LEVITRA) 20 MG tablet, Take 1 tablet (20 mg total) by mouth daily as needed for erectile dysfunction., Disp: 10 tablet, Rfl: 3 ? ? ?Exam: ?Current vital signs: ?BP (!) 152/101   Pulse 82   Temp 98.7 ?F (37.1 ?C) (Oral)   Resp 17   Wt 92.6 kg   SpO2 96%   BMI 31.97 kg/m?  ?Vital signs in last 24 hours: ?Temp:  [98.7 ?F (37.1 ?C)] 98.7 ?F (37.1 ?C) (04/06 2245) ?Pulse Rate:  [82-87] 82 (04/06 2315) ?Resp:  [13-17] 17 (04/06 2315) ?BP: (152-163)/(95-101) 152/101 (04/06 2315) ?SpO2:  [96 %-97 %] 96 % (04/06 2315) ?Weight:  [92.6 kg]  92.6 kg (04/06 2200) ?GENERAL: Awake, alert in NAD ?HEENT: - Normocephalic and atraumatic, dry mm, no LN++, no Thyromegally ?LUNGS - Clear to auscultation bilaterally with no wheezes ?CV - S1S2 RRR, no m/r/g, equal pulses bilaterally. ?ABDOMEN - Soft, nontender, nondistended with normoactive BS ?Ext: warm, well perfused, intact peripheral pulses, no  edema ? ?NEURO:  ?Awake alert oriented x3.  Speech is nondysarthric.  No evidence of aphasia.  Cranial nerves II to XII intact.  Motor examination with no drift in any of the 4 extremities.  Sensation intact light touch without extinction.  Coordination exam with no dysmetria. ?NIH stroke scale-0 ? ?Labs ?I have reviewed labs in epic and the results  pertinent to this consultation are: ? ?CBC ?   ?Component Value Date/Time  ? WBC 9.1 10/22/2021 2222  ? RBC 4.81 10/22/2021 2222  ? HGB 16.0 10/22/2021 2228  ? HCT 47.0 10/22/2021 2228  ? PLT 166 10/22/2021 2222  ? MCV 93.1 10/22/2021 2222  ? MCH 32.2 10/22/2021 2222  ? MCHC 34.6 10/22/2021 2222  ? RDW 11.9 10/22/2021 2222  ? LYMPHSABS 3.1 10/22/2021 2222  ? MONOABS 0.8 10/22/2021 2222  ? EOSABS 0.3 10/22/2021 2222  ? BASOSABS 0.0 10/22/2021 2222  ? ? ?CMP  ?   ?Component Value Date/Time  ? NA 143 10/22/2021 2228  ? K 3.4 (L) 10/22/2021 2228  ? CL 107 10/22/2021 2228  ? CO2 22 10/22/2021 2222  ? GLUCOSE 101 (H) 10/22/2021 2228  ? BUN 23 (H) 10/22/2021 2228  ? BUN 16 09/25/2019 0000  ? CREATININE 1.30 (H) 10/22/2021 2228  ? CALCIUM 9.5 10/22/2021 2222  ? PROT 7.0 10/22/2021 2222  ? ALBUMIN 4.4 10/22/2021 2222  ? AST 30 10/22/2021 2222  ? ALT 36 10/22/2021 2222  ? ALKPHOS 78 10/22/2021 2222  ? BILITOT 0.7 10/22/2021 2222  ? GFRNONAA >60 10/22/2021 2222  ? ? ?Lipid Panel  ?   ?Component Value Date/Time  ? CHOL 214 (H) 04/01/2021 1151  ? TRIG (H) 04/01/2021 1151  ?  413.0 Triglyceride is over 400; calculations on Lipids are invalid.  ? HDL 33.10 (L) 04/01/2021 1151  ? CHOLHDL 6 04/01/2021 1151  ? VLDL 62.8 (H) 09/07/2017 1106  ? LDLCALC 146 09/25/2019 0000  ? LDLDIRECT 84.0 04/01/2021 1151  ? ? ? ?Imaging ?I have reviewed the images obtained: ? ?CT-head: ASPECT 10, no bleed ? ? ?Assessment:  ?51 year old with sudden onset of dizziness and questionable right facial droop with last known well at 9 PM present to the emergency room as an acute code stroke. ?On my examination, NIH stroke scale 0 ?Symptoms likely consistent with hypertensive urgency but given risk factors, I would recommend an MRI of the brain to rule out a small lacunar infarct. ? ?Impression: ?Hypertensive urgency ?Rule out stroke ? ?Recommendations: ?Stat MRI brain without contrast ? ?ADDENDUM 0437 - 10/23/2021 ?MRI negative for stroke, no further stroke  work-up ?Treat hypertensive urgency as you would medically. ?Strict blood pressure control as an outpatient ?Discussed with Dr. Clayborne Dana. ? ?-- ?Milon Dikes, MD ?Neurologist ?[facility] ?Pager: [phone] ?

## 2021-10-22 NOTE — ED Triage Notes (Signed)
PT arrived via [facility] for Code Stroke. Pt reported sudden onset dizziness, headache, hypertension when moving to standing position from sitting. EMS report right sided facial droop during transport, no other deficits. LSN 2100.  20g LAC. PMH HTN. ? ?EMS vitals ?BP 202/104 ?HR 99 ?SPO2 96% RA ?CBG 128 ? ? ? ? ?

## 2021-10-22 NOTE — ED Provider Notes (Signed)
?[facility] ?Provider Note ? ? ?CSN: [id number] ?Arrival date & time: 10/22/21  2219 ? ?  ? ?History ? ?No chief complaint on file. ? ? ?Wayne Morales is a 51 y.o. male.  Presenting to the emergency department with concern for stroke alert.  Patient reports that he was frustrated, short of breath while dealing with his dog when he decided to check his blood pressure, initially was okay but then he rechecked it and it was very elevated and he was very concerned about this and contacted EMS.  EMS report possible facial droop and initiated code stroke.  Patient states he did not have any chest pain throughout this episode.  States that he was not aware of any numbness or weakness in his face or the rest of his body.  No speech or vision change.  Does not feel short of breath at present.  Has history of high blood pressure but does not take his medication regularly.  Takes natural supplement for his blood pressure.  Checks his blood pressure regularly.  Is prescribed Dyazide. ? ?HPI ? ?  ? ?Home Medications ?Prior to Admission medications   ?Medication Sig Start Date End Date Taking? Authorizing Provider  ?Ascorbic Acid (VITAMIN C PO) Take 1,000 mg by mouth daily at 6 (six) AM.    [provider]  ?Cholecalciferol 1.25 MG (50000 UT) capsule Take 1 capsule (50,000 Units total) by mouth once a week. 04/01/21   Etta GrandchildJones, Thomas L, MD  ?ELDERBERRY PO Take 1 tablet by mouth daily at 6 (six) AM.    [provider]  ?rosuvastatin (CRESTOR) 20 MG tablet Take 1 tablet (20 mg total) by mouth daily. Must keep scheduled appt for future refills 07/28/21   Etta GrandchildJones, Thomas L, MD  ?triamterene-hydrochlorothiazide (DYAZIDE) 37.5-25 MG capsule Take 1 each (1 capsule total) by mouth daily. 07/14/21   Etta GrandchildJones, Thomas L, MD  ?vardenafil (LEVITRA) 20 MG tablet Take 1 tablet (20 mg total) by mouth daily as needed for erectile dysfunction. 06/02/21   Etta GrandchildJones, Thomas L, MD  ?   ? ?Allergies     ?Penicillins   ? ?Review of Systems   ?Review of Systems  ?Constitutional:  Positive for fatigue. Negative for chills and fever.  ?HENT:  Negative for ear pain and sore throat.   ?Eyes:  Negative for pain and visual disturbance.  ?Respiratory:  Positive for shortness of breath. Negative for cough.   ?Cardiovascular:  Negative for chest pain and palpitations.  ?Gastrointestinal:  Negative for abdominal pain and vomiting.  ?Genitourinary:  Negative for dysuria and hematuria.  ?Musculoskeletal:  Negative for arthralgias and back pain.  ?Skin:  Negative for color change and rash.  ?Neurological:  Negative for seizures and syncope.  ?All other systems reviewed and are negative. ? ?Physical Exam ?Updated Vital Signs ?BP (!) 163/95 (BP Location: Right Arm)   Pulse 87   Temp 98.7 ?F (37.1 ?C) (Oral)   Resp 13   Wt 92.6 kg   SpO2 97%   BMI 31.97 kg/m?  ?Physical Exam ?Vitals and nursing note reviewed.  ?Constitutional:   ?   General: He is not in acute distress. ?   Appearance: He is well-developed.  ?HENT:  ?   Head: Normocephalic and atraumatic.  ?Eyes:  ?   Conjunctiva/sclera: Conjunctivae normal.  ?Cardiovascular:  ?   Rate and Rhythm: Normal rate and regular rhythm.  ?   Heart sounds: No murmur heard. ?Pulmonary:  ?   Effort: Pulmonary  effort is normal. No respiratory distress.  ?   Breath sounds: Normal breath sounds.  ?Abdominal:  ?   Palpations: Abdomen is soft.  ?   Tenderness: There is no abdominal tenderness.  ?Musculoskeletal:     ?   General: No swelling.  ?   Cervical back: Neck supple.  ?Skin: ?   General: Skin is warm and dry.  ?   Capillary Refill: Capillary refill takes less than 2 seconds.  ?Neurological:  ?   General: No focal deficit present.  ?   Mental Status: He is alert and oriented to person, place, and time.  ?   Comments: Normal strength and sensation in upper and lower extremities, speech is clear, no facial droop noted  ?Psychiatric:     ?   Mood and Affect: Mood normal.  ? ? ?ED Results  / Procedures / Treatments   ?Labs ?(all labs ordered are listed, but only abnormal results are displayed) ?Labs Reviewed  ?COMPREHENSIVE METABOLIC PANEL - Abnormal; Notable for the following components:  ?    Result Value  ? Glucose, Bld 101 (*)   ? BUN 22 (*)   ? Creatinine, Ser 1.28 (*)   ? All other components within normal limits  ?I-STAT CHEM 8, ED - Abnormal; Notable for the following components:  ? Potassium 3.4 (*)   ? BUN 23 (*)   ? Creatinine, Ser 1.30 (*)   ? Glucose, Bld 101 (*)   ? All other components within normal limits  ?RESP PANEL BY RT-PCR (FLU A&B, COVID) ARPGX2  ?ETHANOL  ?PROTIME-INR  ?APTT  ?CBC  ?DIFFERENTIAL  ?RAPID URINE DRUG SCREEN, HOSP PERFORMED  ?URINALYSIS, ROUTINE W REFLEX MICROSCOPIC  ?CBG MONITORING, ED  ?TROPONIN I (HIGH SENSITIVITY)  ? ? ?EKG ?EKG Interpretation ? ?Date/Time:  Thursday October 22 2021 22:59:51 EDT ?Ventricular Rate:  81 ?PR Interval:  203 ?QRS Duration: 96 ?QT Interval:  353 ?QTC Calculation: 410 ?R Axis:   36 ?Text Interpretation: Sinus rhythm Borderline prolonged PR interval RSR' in V1 or V2, probably normal variant Minimal ST elevation, anterior leads Similar findings in prior ECG on September 2022 Confirmed by Marianna Fuss ([id number]) on 10/22/2021 11:14:17 PM ? ?Radiology ?CT HEAD CODE STROKE WO CONTRAST ? ?Result Date: 10/22/2021 ?CLINICAL DATA:  Right facial droop EXAM: CT HEAD WITHOUT CONTRAST CT ANGIOGRAPHY OF THE HEAD AND NECK TECHNIQUE: Contiguous axial images were obtained from the base of the skull through the vertex without intravenous contrast. Multidetector CT imaging of the head and neck was performed using the standard protocol during bolus administration of intravenous contrast. Multiplanar CT image reconstructions and MIPs were obtained to evaluate the vascular anatomy. Carotid stenosis measurements (when applicable) are obtained utilizing NASCET criteria, using the distal internal carotid diameter as the denominator. RADIATION DOSE REDUCTION: This  exam was performed according to the departmental dose-optimization program which includes automated exposure control, adjustment of the mA and/or kV according to patient size and/or use of iterative reconstruction technique. CONTRAST:  37mL OMNIPAQUE IOHEXOL 350 MG/ML SOLN COMPARISON:  None. FINDINGS: CT HEAD FINDINGS Brain: There is no mass, hemorrhage or extra-axial collection. The size and configuration of the ventricles and extra-axial CSF spaces are normal. The brain parenchyma is normal, without evidence of acute or chronic infarction. Vascular: No abnormal hyperdensity of the major intracranial arteries or dural venous sinuses. No intracranial atherosclerosis. Skull: The visualized skull base, calvarium and extracranial soft tissues are normal. Sinuses/Orbits: No fluid levels or advanced mucosal thickening of the visualized paranasal  sinuses. No mastoid or middle ear effusion. The orbits are normal. ASPECTS (Alberta Stroke Program Early CT Score) - Ganglionic level infarction (caudate, lentiform nuclei, internal capsule, insula, M1-M3 cortex): 7 - Supraganglionic infarction (M4-M6 cortex): 3 Total score (0-10 with 10 being normal): 10 CTA NECK FINDINGS SKELETON: There is no bony spinal canal stenosis. No lytic or blastic lesion. OTHER NECK: Normal pharynx, larynx and major salivary glands. No cervical lymphadenopathy. Unremarkable thyroid gland. UPPER CHEST: No pneumothorax or pleural effusion. No nodules or masses. AORTIC ARCH: There is no calcific atherosclerosis of the aortic arch. There is no aneurysm, dissection or hemodynamically significant stenosis of the visualized portion of the aorta. Conventional 3 vessel aortic branching pattern. The visualized proximal subclavian arteries are widely patent. RIGHT CAROTID SYSTEM: Normal without aneurysm, dissection or stenosis. LEFT CAROTID SYSTEM: Normal without aneurysm, dissection or stenosis. VERTEBRAL ARTERIES: Left dominant configuration. Both origins are  clearly patent. There is no dissection, occlusion or flow-limiting stenosis to the skull base (V1-V3 segments). CTA HEAD FINDINGS POSTERIOR CIRCULATION: --Vertebral arteries: Normal V4 segments. --Inferior cerebel

## 2021-10-23 ENCOUNTER — Emergency Department (HOSPITAL_COMMUNITY): Payer: 59

## 2021-10-23 LAB — URINALYSIS, ROUTINE W REFLEX MICROSCOPIC
Bilirubin Urine: NEGATIVE
Glucose, UA: NEGATIVE mg/dL
Hgb urine dipstick: NEGATIVE
Ketones, ur: NEGATIVE mg/dL
Leukocytes,Ua: NEGATIVE
Nitrite: NEGATIVE
Protein, ur: NEGATIVE mg/dL
Specific Gravity, Urine: 1.035 — ABNORMAL HIGH (ref 1.005–1.030)
pH: 6 (ref 5.0–8.0)

## 2021-10-23 LAB — RAPID URINE DRUG SCREEN, HOSP PERFORMED
Amphetamines: NOT DETECTED
Barbiturates: NOT DETECTED
Benzodiazepines: NOT DETECTED
Cocaine: NOT DETECTED
Opiates: NOT DETECTED
Tetrahydrocannabinol: NOT DETECTED

## 2021-10-23 LAB — TROPONIN I (HIGH SENSITIVITY): Troponin I (High Sensitivity): 4 ng/L (ref <18)

## 2021-10-23 NOTE — ED Notes (Signed)
Patient transported to MRI 

## 2021-10-25 ENCOUNTER — Encounter: Payer: Self-pay | Admitting: Internal Medicine

## 2021-10-25 ENCOUNTER — Other Ambulatory Visit: Payer: Self-pay | Admitting: Internal Medicine

## 2021-10-25 DIAGNOSIS — I10 Essential (primary) hypertension: Secondary | ICD-10-CM

## 2021-10-26 ENCOUNTER — Encounter: Payer: Self-pay | Admitting: Family Medicine

## 2021-10-26 ENCOUNTER — Ambulatory Visit: Payer: 59 | Admitting: Family Medicine

## 2021-10-26 VITALS — BP 146/88 | HR 95 | Ht 67.0 in | Wt 200.0 lb

## 2021-10-26 DIAGNOSIS — I10 Essential (primary) hypertension: Secondary | ICD-10-CM

## 2021-10-26 DIAGNOSIS — R55 Syncope and collapse: Secondary | ICD-10-CM | POA: Diagnosis not present

## 2021-10-26 DIAGNOSIS — R000 Tachycardia, unspecified: Secondary | ICD-10-CM | POA: Insufficient documentation

## 2021-10-26 DIAGNOSIS — R009 Unspecified abnormalities of heart beat: Secondary | ICD-10-CM | POA: Diagnosis not present

## 2021-10-26 NOTE — Assessment & Plan Note (Signed)
BP Readings from Last 3 Encounters:  ?10/26/21 (!) 146/88  ?10/23/21 (!) 152/90  ?07/14/21 (!) 142/92  ? ?Per pt labile in the setting of pulse variation  ?occ dizziness ?Hesitant to inc his diazide in light of above  ?Planning cardiology visit  ?

## 2021-10-26 NOTE — Progress Notes (Signed)
? ?Subjective:  ? ? Patient ID: Wayne Morales, male    DOB: 09/25/1970, 51 y.o.   MRN: [id number] ? ?HPI ?51 yo pf of Dr Yetta BarreJones presents for elevated heart rate ? ?Wt Readings from Last 3 Encounters:  ?10/26/21 200 lb (90.7 kg)  ?10/22/21 204 lb 2.3 oz (92.6 kg)  ?07/14/21 207 lb (93.9 kg)  ? ?31.32 kg/m? ? ? ?Noted (watching his HR and bp) on his smart watch  ?This adv him he may have a fib  ?He felt heart racing up and down all that day (he ran it when his heart was going fast-then 10 minutes later back to normal)  ?Headaches-small/here and there  ?HR 95 ?Sob sporadically  ?Has been fatigued (easy fatigue) -out of work today  ? ?These episodes of inc pulse and BP last few minutes to an hour  ?Laughing causes dizziness on and off for a while / about a year  ? ? ?Was seen in ER on 4/6 for HTN  ?Had called EMS -activated code stroke but MRI was normal and symptoms were resolved once he got to the ER  ?? If brief facial droop  ? ?Baseline EKG -nonsp ST seg change  ? ?EKG today NSR with rate of 93 ?Anteriolat ST elevation (per computer repolarization variant) ?No significant change from prior  ? ?Has appt with cardiologist on 28th  ?Will see Dr Jacinto HalimGanji on 28th  ? ?He is EMT-knowledgeable ?He does note -irregular pulse when he checks it  ? ?Mother has a fib ?Sister has afib  ? ? ?He has a h/o HTN  ?. ?BP Readings from Last 3 Encounters:  ?10/26/21 (!) 146/88  ?10/23/21 (!) 152/90  ?07/14/21 (!) 142/92  ? ?Pulse Readings from Last 3 Encounters:  ?10/26/21 95  ?10/23/21 78  ?07/14/21 88  ? ? ?Takes traim-hct 37.5-25 mg once daily  ? ?Lab Results  ?Component Value Date  ? CREATININE 1.30 (H) 10/22/2021  ? BUN 23 (H) 10/22/2021  ? NA 143 10/22/2021  ? K 3.4 (L) 10/22/2021  ? CL 107 10/22/2021  ? CO2 22 10/22/2021  ? ?Lab Results  ?Component Value Date  ? CALCIUM 9.5 10/22/2021  ? CAION 1.18 10/22/2021  ? ?Lab Results  ?Component Value Date  ? TSH 0.55 04/01/2021  ? ?Lab Results  ?Component Value Date  ? INR 0.9 10/22/2021   ? ?Lab Results  ?Component Value Date  ? WBC 9.1 10/22/2021  ? HGB 16.0 10/22/2021  ? HCT 47.0 10/22/2021  ? MCV 93.1 10/22/2021  ? PLT 166 10/22/2021  ? ? ? ? ? ?Hyperlipidemia ?Lab Results  ?Component Value Date  ? CHOL 214 (H) 04/01/2021  ? HDL 33.10 (L) 04/01/2021  ? LDLCALC 146 09/25/2019  ? LDLDIRECT 84.0 04/01/2021  ? TRIG (H) 04/01/2021  ?  413.0 Triglyceride is over 400; calculations on Lipids are invalid.  ? CHOLHDL 6 04/01/2021  ? ?Crestor 20 mg daily ? ?Patient Active Problem List  ? Diagnosis Date Noted  ? Tachycardia 10/26/2021  ? Pre-syncope 10/26/2021  ? Pulsatile tinnitus of both ears 07/14/2021  ? Drug-induced erectile dysfunction 06/02/2021  ? Encounter for general adult medical examination with abnormal findings 04/01/2021  ? Vitamin D deficiency disease 04/01/2021  ? Colon cancer screening 04/01/2021  ? Motion sickness 02/18/2021  ? Pure hyperglyceridemia 09/09/2017  ? Routine general medical examination at a health care facility 09/07/2017  ? Onychomycosis of great toe 09/07/2017  ? Protrusion of lumbar intervertebral disc   ? Essential hypertension  07/08/2014  ? Scalp psoriasis 12/15/2012  ? Hyperlipidemia with target LDL less than 160 07/08/2008  ? ?Past Medical History:  ?Diagnosis Date  ? Chronic back pain   ? Hyperlipidemia   ? diet controlled  ? Hypertension   ? on meds  ? Scalp psoriasis 12/15/2012  ? ?Past Surgical History:  ?Procedure Laterality Date  ? INGUINAL HERNIA REPAIR Left   ? as a child  ? ?Social History  ? ?Tobacco Use  ? Smoking status: Never  ? Smokeless tobacco: Never  ?Vaping Use  ? Vaping Use: Never used  ?Substance Use Topics  ? Alcohol use: Yes  ?  Comment: socially  ? Drug use: Never  ? ?Family History  ?Problem Relation Age of Onset  ? Hyperlipidemia Mother   ? Colon polyps Father 64  ? Colon cancer Father 38  ? Diabetes Other   ? Hyperlipidemia Other   ? Early death Neg Hx   ? Kidney disease Neg Hx   ? Hypertension Neg Hx   ? Heart disease Neg Hx   ? Stroke Neg Hx    ? Stomach cancer Neg Hx   ? Rectal cancer Neg Hx   ? Esophageal cancer Neg Hx   ? ?Allergies  ?Allergen Reactions  ? Penicillins Hives and Rash  ?  REACTION: Rash  ? ?Current Outpatient Medications on File Prior to Visit  ?Medication Sig Dispense Refill  ? Ascorbic Acid (VITAMIN C PO) Take 1,000 mg by mouth daily at 6 (six) AM.    ? Cholecalciferol 1.25 MG (50000 UT) capsule Take 1 capsule (50,000 Units total) by mouth once a week. 12 capsule 0  ? ELDERBERRY PO Take 1 tablet by mouth daily at 6 (six) AM.    ? rosuvastatin (CRESTOR) 20 MG tablet Take 1 tablet (20 mg total) by mouth daily. Must keep scheduled appt for future refills 90 tablet 1  ? triamterene-hydrochlorothiazide (DYAZIDE) 37.5-25 MG capsule TAKE 1 CAPSULE BY MOUTH ONCE DAILY 90 capsule 0  ? vardenafil (LEVITRA) 20 MG tablet Take 1 tablet (20 mg total) by mouth daily as needed for erectile dysfunction. 10 tablet 3  ? ?No current facility-administered medications on file prior to visit.  ?  ? ?Review of Systems  ?Constitutional:  Positive for fatigue. Negative for activity change, appetite change, fever and unexpected weight change.  ?HENT:  Negative for congestion, rhinorrhea, sore throat and trouble swallowing.   ?Eyes:  Negative for pain, redness, itching and visual disturbance.  ?Respiratory:  Negative for cough, chest tightness, shortness of breath and wheezing.   ?Cardiovascular:  Positive for palpitations. Negative for chest pain and leg swelling.  ?Gastrointestinal:  Negative for abdominal pain, blood in stool, constipation, diarrhea and nausea.  ?Endocrine: Negative for cold intolerance, heat intolerance, polydipsia and polyuria.  ?Genitourinary:  Negative for difficulty urinating, dysuria, frequency and urgency.  ?Musculoskeletal:  Negative for arthralgias, joint swelling and myalgias.  ?Skin:  Negative for pallor and rash.  ?Neurological:  Positive for dizziness. Negative for tremors, weakness, numbness and headaches.  ?Hematological:   Negative for adenopathy. Does not bruise/bleed easily.  ?Psychiatric/Behavioral:  Negative for decreased concentration and dysphoric mood. The patient is not nervous/anxious.   ? ?   ?Objective:  ? Physical Exam ?Constitutional:   ?   General: He is not in acute distress. ?   Appearance: Normal appearance. He is well-developed. He is obese. He is not ill-appearing or diaphoretic.  ?HENT:  ?   Head: Normocephalic and atraumatic.  ?Eyes:  ?  Conjunctiva/sclera: Conjunctivae normal.  ?   Pupils: Pupils are equal, round, and reactive to light.  ?Neck:  ?   Thyroid: No thyromegaly.  ?   Vascular: No carotid bruit or JVD.  ?Cardiovascular:  ?   Rate and Rhythm: Regular rhythm. Tachycardia present.  ?   Pulses: Normal pulses.  ?   Heart sounds: Normal heart sounds. No murmur heard. ?  No gallop.  ?Pulmonary:  ?   Effort: Pulmonary effort is normal. No respiratory distress.  ?   Breath sounds: Normal breath sounds. No wheezing or rales.  ?   Comments: No crackles ? ?Abdominal:  ?   General: There is no distension or abdominal bruit.  ?   Palpations: Abdomen is soft.  ?Musculoskeletal:  ?   Cervical back: Normal range of motion and neck supple.  ?   Right lower leg: No edema.  ?   Left lower leg: No edema.  ?Lymphadenopathy:  ?   Cervical: No cervical adenopathy.  ?Skin: ?   General: Skin is warm and dry.  ?   Coloration: Skin is not pale.  ?   Findings: No rash.  ?Neurological:  ?   Mental Status: He is alert.  ?   Coordination: Coordination normal.  ?   Deep Tendon Reflexes: Reflexes are normal and symmetric. Reflexes normal.  ?Psychiatric:     ?   Mood and Affect: Mood normal.  ?   Comments: Pleasant  ?Not anxious  ?  ? ? ? ? ? ?   ?Assessment & Plan:  ? ?Problem List Items Addressed This Visit   ? ?  ? Cardiovascular and Mediastinum  ? Essential hypertension  ?  BP Readings from Last 3 Encounters:  ?10/26/21 (!) 146/88  ?10/23/21 (!) 152/90  ?07/14/21 (!) 142/92  ?Per pt labile in the setting of pulse variation  ?occ  dizziness ?Hesitant to inc his diazide in light of above  ?Planning cardiology visit  ?  ?  ? Relevant Orders  ? Ambulatory referral to Cardiology  ? Pre-syncope  ?  Dizziness with laughing and sometimes exe

## 2021-10-26 NOTE — Assessment & Plan Note (Signed)
HR is labile, goes up sometimes w/o exertion  ?Pt sometimes feels this and at one time his fitness watch told him a fib  ?He is EMT/knowlegable as well  ?Some pre syncope (also dizzy when he laughs) ?Reviewed last pcp note, reviewed ER visit, labs and imaging  ?Reassuring exam and EKG today  ?Has appt with cardiology late this month, would like to see if he can get in any sooner  ?ER precautions discussed in detail  ?Urgent referral to cardiology (pcp is Dr Ronnald Ramp) ?inst to take baby asa daily in the meantime  ? ?

## 2021-10-26 NOTE — Telephone Encounter (Signed)
Per chart pt is seeing Dr. Glori Bickers @ [facility] (Tachycardia).Marland KitchenJohny Chess ?

## 2021-10-26 NOTE — Patient Instructions (Addendum)
Go ahead and take a baby aspirin daily  ?Avoid alcohol for now  ?Avoid caffeine also  ? ?No change in medicines  ? ?If worse, chest pain or persistent high heart rate -go to the ER  ?Watch for shortness of breath also  ? ?I will place a cardiology referral -you will get a call  ?

## 2021-10-26 NOTE — Assessment & Plan Note (Signed)
Dizziness with laughing and sometimes exertion or when pulse rate goes up  ?This is  ? ?Some labile HR (per fitness watch possible a fib)  ?Reassuring exam and EKG today  ?He has cardiology appt late this month, want to see if we can get him in sooner  ? ?

## 2021-10-27 ENCOUNTER — Other Ambulatory Visit (HOSPITAL_BASED_OUTPATIENT_CLINIC_OR_DEPARTMENT_OTHER): Payer: Self-pay

## 2021-10-27 ENCOUNTER — Other Ambulatory Visit: Payer: Self-pay

## 2021-10-27 ENCOUNTER — Encounter (HOSPITAL_BASED_OUTPATIENT_CLINIC_OR_DEPARTMENT_OTHER): Payer: Self-pay | Admitting: Obstetrics and Gynecology

## 2021-10-27 ENCOUNTER — Emergency Department (HOSPITAL_BASED_OUTPATIENT_CLINIC_OR_DEPARTMENT_OTHER)
Admission: EM | Admit: 2021-10-27 | Discharge: 2021-10-27 | Disposition: A | Discharge status: Home / Self Care | Payer: 59 | Attending: Emergency Medicine | Admitting: Emergency Medicine

## 2021-10-27 ENCOUNTER — Emergency Department (HOSPITAL_BASED_OUTPATIENT_CLINIC_OR_DEPARTMENT_OTHER): Payer: 59 | Admitting: Radiology

## 2021-10-27 DIAGNOSIS — R002 Palpitations: Secondary | ICD-10-CM | POA: Insufficient documentation

## 2021-10-27 DIAGNOSIS — I10 Essential (primary) hypertension: Secondary | ICD-10-CM | POA: Diagnosis not present

## 2021-10-27 DIAGNOSIS — R0789 Other chest pain: Secondary | ICD-10-CM | POA: Insufficient documentation

## 2021-10-27 DIAGNOSIS — R0602 Shortness of breath: Secondary | ICD-10-CM | POA: Diagnosis not present

## 2021-10-27 DIAGNOSIS — R519 Headache, unspecified: Secondary | ICD-10-CM | POA: Insufficient documentation

## 2021-10-27 DIAGNOSIS — Z79899 Other long term (current) drug therapy: Secondary | ICD-10-CM | POA: Insufficient documentation

## 2021-10-27 HISTORY — DX: Unspecified atrial fibrillation: I48.91

## 2021-10-27 LAB — BASIC METABOLIC PANEL
Anion gap: 15 (ref 5–15)
BUN: 21 mg/dL — ABNORMAL HIGH (ref 6–20)
CO2: 23 mmol/L (ref 22–32)
Calcium: 10.7 mg/dL — ABNORMAL HIGH (ref 8.9–10.3)
Chloride: 100 mmol/L (ref 98–111)
Creatinine, Ser: 1.47 mg/dL — ABNORMAL HIGH (ref 0.61–1.24)
GFR, Estimated: 58 mL/min — ABNORMAL LOW (ref >60)
Glucose, Bld: 128 mg/dL — ABNORMAL HIGH (ref 70–99)
Potassium: 3.8 mmol/L (ref 3.5–5.1)
Sodium: 138 mmol/L (ref 135–145)

## 2021-10-27 LAB — TROPONIN I (HIGH SENSITIVITY)
Troponin I (High Sensitivity): 3 ng/L (ref <18)
Troponin I (High Sensitivity): 3 ng/L (ref <18)

## 2021-10-27 LAB — CBC
HCT: 53.5 % — ABNORMAL HIGH (ref 39.0–52.0)
Hemoglobin: 18.6 g/dL — ABNORMAL HIGH (ref 13.0–17.0)
MCH: 31.1 pg (ref 26.0–34.0)
MCHC: 34.8 g/dL (ref 30.0–36.0)
MCV: 89.5 fL (ref 80.0–100.0)
Platelets: 185 10*3/uL (ref 150–400)
RBC: 5.98 MIL/uL — ABNORMAL HIGH (ref 4.22–5.81)
RDW: 11.8 % (ref 11.5–15.5)
WBC: 8.5 10*3/uL (ref 4.0–10.5)
nRBC: 0 % (ref 0.0–0.2)

## 2021-10-27 MED ORDER — KETOROLAC TROMETHAMINE 15 MG/ML IJ SOLN
15.0000 mg | Freq: Once | INTRAMUSCULAR | Status: AC
  Administered 2021-10-27: 15 mg via INTRAVENOUS
  Filled 2021-10-27: qty 1

## 2021-10-27 MED ORDER — METOPROLOL TARTRATE 50 MG PO TABS
50.0000 mg | ORAL_TABLET | Freq: Two times a day (BID) | ORAL | 2 refills | Status: DC
Start: 2021-10-27 — End: 2021-11-13
  Filled 2021-10-27: qty 28, 14d supply, fill #0

## 2021-10-27 NOTE — Discharge Instructions (Addendum)
Work-up here without any atrial fibrillation.  Heart markers troponins normal x2 chest x-ray negative.  Keep your appointment with cardiology.  Start the beta-blocker medicine along with your other meds take a baby aspirin a day.  Return for any chest pain or pressure or tightness lasting 15 minutes or longer return for any fast heart rate lasting 40 minutes or longer.  ? ?The beta-blocker medicine makes you feel worse you can stop that.  But it could help him control the symptoms some. ?

## 2021-10-27 NOTE — ED Provider Notes (Addendum)
?[facility] ?Provider Note ? ? ?CSN: [id number] ?Arrival date & time: 10/27/21  1154 ? ?  ? ?History ? ?Chief Complaint  ?Patient presents with  ? Atrial Fibrillation  ? Headache  ? ? ?Wayne Morales is a 51 y.o. male. ? ?Patient with persistent symptoms on and off for the past week feeling lightheaded at times feeling as if there is some left anterior chest pressure that lasted only minutes.  Feeling like there is palpitations also lasting only a minute or so.  And at times some exertional shortness of breath.  No leg swelling.  Patient was seen in the emergency department for the symptoms on April 6.  Patient was seen April 9 and 10 by primary care doctor for the symptoms.  All evaluations have not shown any arrhythmias.  Is always been sinus tachycardia.  Patient has follow-up arranged with cardiology.  Patient is started taking a baby aspirin a day.  Patient has a known history of hypertension and he is on Dyazide for that.  The chest discomfort is never lasted longer than 5 minutes the palpitations and never lasted longer than 5 minutes.  She states that never truly documented atrial fibrillation.  His smart watch has said that it is atrial fibrillation. ? ?Past medical history is significant for hypertension hyperlipidemia.  Patient works as a IT sales professional. ? ? ?  ? ?Home Medications ?Prior to Admission medications   ?Medication Sig Start Date End Date Taking? Authorizing Provider  ?metoprolol tartrate (LOPRESSOR) 50 MG tablet Take 1 tablet (50 mg total) by mouth 2 (two) times daily. 10/27/21  Yes Vanetta Mulders, MD  ?Ascorbic Acid (VITAMIN C PO) Take 1,000 mg by mouth daily at 6 (six) AM.    [provider]  ?Cholecalciferol 1.25 MG (50000 UT) capsule Take 1 capsule (50,000 Units total) by mouth once a week. 04/01/21   Etta Grandchild, MD  ?ELDERBERRY PO Take 1 tablet by mouth daily at 6 (six) AM.    [provider]  ?rosuvastatin (CRESTOR) 20 MG tablet Take 1  tablet (20 mg total) by mouth daily. Must keep scheduled appt for future refills 07/28/21   Etta Grandchild, MD  ?triamterene-hydrochlorothiazide (DYAZIDE) 37.5-25 MG capsule TAKE 1 CAPSULE BY MOUTH ONCE DAILY 10/25/21   Etta Grandchild, MD  ?vardenafil (LEVITRA) 20 MG tablet Take 1 tablet (20 mg total) by mouth daily as needed for erectile dysfunction. 06/02/21   Etta Grandchild, MD  ?   ? ?Allergies    ?Penicillins   ? ?Review of Systems   ?Review of Systems  ?Constitutional:  Negative for chills and fever.  ?HENT:  Negative for ear pain and sore throat.   ?Eyes:  Negative for pain and visual disturbance.  ?Respiratory:  Positive for shortness of breath. Negative for cough.   ?Cardiovascular:  Positive for chest pain and palpitations.  ?Gastrointestinal:  Negative for abdominal pain and vomiting.  ?Genitourinary:  Negative for dysuria and hematuria.  ?Musculoskeletal:  Negative for arthralgias and back pain.  ?Skin:  Negative for color change and rash.  ?Neurological:  Negative for seizures and syncope.  ?All other systems reviewed and are negative. ? ?Physical Exam ?Updated Vital Signs ?BP 120/90   Pulse 95   Temp 97.7 ?F (36.5 ?C)   Resp 17   SpO2 95%  ?Physical Exam ?Vitals and nursing note reviewed.  ?Constitutional:   ?   General: He is not in acute distress. ?   Appearance: Normal appearance. He is  well-developed.  ?HENT:  ?   Head: Normocephalic and atraumatic.  ?Eyes:  ?   Extraocular Movements: Extraocular movements intact.  ?   Conjunctiva/sclera: Conjunctivae normal.  ?   Pupils: Pupils are equal, round, and reactive to light.  ?Cardiovascular:  ?   Rate and Rhythm: Regular rhythm. Tachycardia present.  ?   Heart sounds: No murmur heard. ?Pulmonary:  ?   Effort: Pulmonary effort is normal. No respiratory distress.  ?   Breath sounds: Normal breath sounds. No wheezing, rhonchi or rales.  ?Chest:  ?   Chest wall: No tenderness.  ?Abdominal:  ?   Palpations: Abdomen is soft.  ?   Tenderness: There is no  abdominal tenderness.  ?Musculoskeletal:     ?   General: No swelling.  ?   Cervical back: Normal range of motion and neck supple.  ?   Right lower leg: No edema.  ?   Left lower leg: No edema.  ?Skin: ?   General: Skin is warm and dry.  ?   Capillary Refill: Capillary refill takes less than 2 seconds.  ?Neurological:  ?   General: No focal deficit present.  ?   Mental Status: He is alert and oriented to person, place, and time.  ?   Cranial Nerves: No cranial nerve deficit.  ?   Sensory: No sensory deficit.  ?   Motor: No weakness.  ?Psychiatric:     ?   Mood and Affect: Mood normal.  ? ? ?ED Results / Procedures / Treatments   ?Labs ?(all labs ordered are listed, but only abnormal results are displayed) ?Labs Reviewed  ?BASIC METABOLIC PANEL - Abnormal; Notable for the following components:  ?    Result Value  ? Glucose, Bld 128 (*)   ? BUN 21 (*)   ? Creatinine, Ser 1.47 (*)   ? Calcium 10.7 (*)   ? GFR, Estimated 58 (*)   ? All other components within normal limits  ?CBC - Abnormal; Notable for the following components:  ? RBC 5.98 (*)   ? Hemoglobin 18.6 (*)   ? HCT 53.5 (*)   ? All other components within normal limits  ?TROPONIN I (HIGH SENSITIVITY)  ?TROPONIN I (HIGH SENSITIVITY)  ? ? ?EKG ?EKG Interpretation ? ?Date/Time:  Tuesday October 27 2021 12:18:47 EDT ?Ventricular Rate:  110 ?PR Interval:  186 ?QRS Duration: 86 ?QT Interval:  321 ?QTC Calculation: 435 ?R Axis:   -62 ?Text Interpretation: Sinus tachycardia Probable left atrial enlargement Left anterior fascicular block Anterior infarct, old ST elevation, consider inferior injury Confirmed by Vanetta MuldersZackowski, Shaundra Fullam [phone]([id number]) on 10/27/2021 12:35:00 PM ? ?Radiology ?DG Chest Port 1 View ? ?Result Date: 10/27/2021 ?CLINICAL DATA:  Chest pain. EXAM: PORTABLE CHEST 1 VIEW COMPARISON:  June 03, 2008. FINDINGS: The heart size and mediastinal contours are within normal limits. Both lungs are clear. The visualized skeletal structures are unremarkable. IMPRESSION: No  active disease. Electronically Signed   By: Lupita RaiderJames  Green Jr M.D.   On: 10/27/2021 12:46   ? ?Procedures ?Procedures  ? ? ?Medications Ordered in ED ?Medications  ?ketorolac (TORADOL) 15 MG/ML injection 15 mg (15 mg Intravenous Given 10/27/21 1404)  ? ? ?ED Course/ Medical Decision Making/ A&P ?  ?                        ?Medical Decision Making ?Amount and/or Complexity of Data Reviewed ?Labs: ordered. ?Radiology: ordered. ? ?Risk ?Prescription drug management. ? ? ?  EKGs x2 without any significant changes.  There are some subtle inferior ST changes.  Patient's troponins here x2 were normal.  Chest x-ray normal.  Electrolytes are normal BUN a little elevated GFR of 58 slightly off from baseline. ? ?Troponin initially 3 and repeat was 3.  So no change there at all.  Chest x-ray negative.  Cardiac monitoring is a sinus tachycardia with rates right around 100.  No fast heart rate. ? ?Reassured patient we will start him on a beta-blocker metoprolol 50 mg twice a day which may help with the palpitations he has follow-up arranged with cardiology has been take a baby aspirin a day. ? ?Patient will return for any chest discomfort lasting 15 minutes or longer in any fast heart rate lasting 40 minutes or longer. ? ?Clinically not concerned about pulmonary embolus.  Patient's shortness of breath has been intermittent not very frequent oxygen sats here are in the upper 90s to 100.  Patient's not winded no leg swelling.  I do not see an indication for D-dimer screening or for CT angio. ? ? ?Final Clinical Impression(s) / ED Diagnoses ?Final diagnoses:  ?Palpitations  ?Atypical chest pain  ? ? ?Rx / DC Orders ?ED Discharge Orders   ? ?      Ordered  ?  metoprolol tartrate (LOPRESSOR) 50 MG tablet  2 times daily       ? 10/27/21 1539  ? ?  ?  ? ?  ? ? ?  ?Vanetta Mulders, MD ?10/27/21 1545 ? ?  ?Vanetta Mulders, MD ?10/27/21 1552 ? ?

## 2021-10-27 NOTE — ED Triage Notes (Signed)
Patient reports to the ER for A-fib. Patient reports he has a hx of A-fib and feels as if this is happening again. Patient also reports a headache. Patient endorses feeling weak as well ?

## 2021-10-28 LAB — CBG MONITORING, ED: Glucose-Capillary: 146 mg/dL — ABNORMAL HIGH (ref 70–99)

## 2021-10-31 ENCOUNTER — Emergency Department (HOSPITAL_COMMUNITY)
Admission: EM | Admit: 2021-10-31 | Discharge: 2021-11-01 | Disposition: A | Discharge status: Home / Self Care | Payer: 59 | Attending: Emergency Medicine | Admitting: Emergency Medicine

## 2021-10-31 DIAGNOSIS — R079 Chest pain, unspecified: Secondary | ICD-10-CM | POA: Diagnosis not present

## 2021-10-31 DIAGNOSIS — Z5321 Procedure and treatment not carried out due to patient leaving prior to being seen by health care provider: Secondary | ICD-10-CM | POA: Diagnosis not present

## 2021-11-01 ENCOUNTER — Encounter (HOSPITAL_COMMUNITY): Payer: Self-pay

## 2021-11-01 ENCOUNTER — Other Ambulatory Visit: Payer: Self-pay

## 2021-11-01 ENCOUNTER — Emergency Department (HOSPITAL_COMMUNITY): Payer: 59

## 2021-11-01 LAB — CBC
HCT: 50.1 % (ref 39.0–52.0)
Hemoglobin: 17.7 g/dL — ABNORMAL HIGH (ref 13.0–17.0)
MCH: 32.1 pg (ref 26.0–34.0)
MCHC: 35.3 g/dL (ref 30.0–36.0)
MCV: 90.8 fL (ref 80.0–100.0)
Platelets: 199 10*3/uL (ref 150–400)
RBC: 5.52 MIL/uL (ref 4.22–5.81)
RDW: 11.5 % (ref 11.5–15.5)
WBC: 12.3 10*3/uL — ABNORMAL HIGH (ref 4.0–10.5)
nRBC: 0 % (ref 0.0–0.2)

## 2021-11-01 LAB — BASIC METABOLIC PANEL
Anion gap: 12 (ref 5–15)
BUN: 23 mg/dL — ABNORMAL HIGH (ref 6–20)
CO2: 21 mmol/L — ABNORMAL LOW (ref 22–32)
Calcium: 9.5 mg/dL (ref 8.9–10.3)
Chloride: 101 mmol/L (ref 98–111)
Creatinine, Ser: 1.34 mg/dL — ABNORMAL HIGH (ref 0.61–1.24)
GFR, Estimated: >60 mL/min (ref >60)
Glucose, Bld: 131 mg/dL — ABNORMAL HIGH (ref 70–99)
Potassium: 3.5 mmol/L (ref 3.5–5.1)
Sodium: 134 mmol/L — ABNORMAL LOW (ref 135–145)

## 2021-11-01 LAB — TROPONIN I (HIGH SENSITIVITY): Troponin I (High Sensitivity): 4 ng/L (ref <18)

## 2021-11-01 NOTE — ED Triage Notes (Signed)
Pt arrived POV from home c/o left sided CP that radiates into his back and down his right arm. Pt states the pain started at 830 pm.  ?

## 2021-11-01 NOTE — ED Notes (Signed)
Pt left due to  "feeling better". Pt advised to stay but refused.  ?

## 2021-11-13 ENCOUNTER — Encounter: Payer: Self-pay | Admitting: Internal Medicine

## 2021-11-13 ENCOUNTER — Ambulatory Visit: Payer: 59 | Admitting: Cardiology

## 2021-11-13 ENCOUNTER — Encounter: Payer: Self-pay | Admitting: Cardiology

## 2021-11-13 ENCOUNTER — Ambulatory Visit: Payer: 59 | Admitting: Internal Medicine

## 2021-11-13 ENCOUNTER — Other Ambulatory Visit: Payer: Self-pay

## 2021-11-13 ENCOUNTER — Inpatient Hospital Stay: Payer: 59

## 2021-11-13 VITALS — BP 153/99 | HR 67 | Temp 98.5°F | Resp 17 | Ht 67.0 in | Wt 201.8 lb

## 2021-11-13 VITALS — BP 132/70 | HR 61 | Temp 98.2°F | Ht 67.0 in | Wt 202.0 lb

## 2021-11-13 DIAGNOSIS — R002 Palpitations: Secondary | ICD-10-CM

## 2021-11-13 DIAGNOSIS — I10 Essential (primary) hypertension: Secondary | ICD-10-CM

## 2021-11-13 DIAGNOSIS — I700 Atherosclerosis of aorta: Secondary | ICD-10-CM

## 2021-11-13 DIAGNOSIS — L409 Psoriasis, unspecified: Secondary | ICD-10-CM

## 2021-11-13 DIAGNOSIS — F419 Anxiety disorder, unspecified: Secondary | ICD-10-CM

## 2021-11-13 DIAGNOSIS — T783XXA Angioneurotic edema, initial encounter: Secondary | ICD-10-CM | POA: Diagnosis not present

## 2021-11-13 DIAGNOSIS — E781 Pure hyperglyceridemia: Secondary | ICD-10-CM

## 2021-11-13 DIAGNOSIS — R072 Precordial pain: Secondary | ICD-10-CM

## 2021-11-13 DIAGNOSIS — R012 Other cardiac sounds: Secondary | ICD-10-CM

## 2021-11-13 MED ORDER — OLMESARTAN MEDOXOMIL 40 MG PO TABS: 40.0000 mg | ORAL_TABLET | Freq: Every day | ORAL | 2 refills | Status: DC

## 2021-11-13 MED ORDER — ALPRAZOLAM 0.25 MG PO TABS: 0.2500 mg | ORAL_TABLET | Freq: Two times a day (BID) | ORAL | 0 refills | Status: DC | PRN

## 2021-11-13 MED ORDER — METHYLPREDNISOLONE ACETATE 80 MG/ML IJ SUSP
80.0000 mg | Freq: Once | INTRAMUSCULAR | Status: AC
  Administered 2021-11-13: 80 mg via INTRAMUSCULAR

## 2021-11-13 MED ORDER — TRIAMCINOLONE ACETONIDE 0.1 % EX CREA
1.0000 | TOPICAL_CREAM | Freq: Two times a day (BID) | CUTANEOUS | 0 refills | Status: DC
Start: 2021-11-13 — End: 2021-11-18

## 2021-11-13 NOTE — Patient Instructions (Signed)
You had the steroid shot today ? ?Please take all new medication as prescribed - the mild cream as needed, and the generic xanax only as needed ? ?Please continue all other medications as before, and refills have been done if requested. ? ?Please have the pharmacy call with any other refills you may need. ? ?Please keep your appointments with your specialists as you may have planned ? ? ? ?

## 2021-11-13 NOTE — Progress Notes (Signed)
? ?Primary Physician/Referring:  Janith Lima, MD ? ?Patient ID: Wayne Morales, male    DOB: August 20, 1970, 51 y.o.   MRN: [id number] ? ?Chief Complaint  ?Patient presents with  ? Hypertension  ? Family Hx of Afib  ? New Patient (Initial Visit)  ? ?HPI:   ? ?Wayne Morales  is a 51 y.o. African-American male patient with hypertension, is referred to me for evaluation of precordial pain, palpitations, and hypertension.  He was also seen in the emergency room with palpitations on 10/27/2021.  He now presents for consultation regarding the same.  Patient has been to the emergency room 3 times over the past few weeks either with palpitations or throbbing neck pain, blood pressure issues. ? ?Patient states that over the past 6 months, he has started noticing radiation and fluctuation in blood pressure, started having rapid palpitations that are on and off quickly that last anywhere from 5 minutes maximum of 20 to 30 minutes.  Associated with shortness of breath and chest discomfort.  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  He is concerned about his recent new symptoms. ? ?Past Medical History:  ?Diagnosis Date  ? Atrial fibrillation ([place])   ? Chronic back pain   ? Hyperlipidemia   ? diet controlled  ? Hypertension   ? on meds  ? Scalp psoriasis 12/15/2012  ? ?Past Surgical History:  ?Procedure Laterality Date  ? INGUINAL HERNIA REPAIR Left   ? as a child  ? ?Family History  ?Problem Relation Age of Onset  ? Hypertension Mother   ? Hyperlipidemia Mother   ? Diabetes Father   ? Colon polyps Father 22  ? Colon cancer Father 53  ? Hypertension Sister   ? Heart disease Brother   ? Brain cancer Brother   ? Sarcoidosis Brother   ?  Sarcoidosis Brother   ? Diabetes Other   ? Hyperlipidemia Other   ? Early death Neg Hx   ? Kidney disease Neg Hx   ? Stroke Neg Hx   ? Stomach cancer Neg Hx   ? Rectal cancer Neg Hx   ? Esophageal cancer Neg Hx   ?  ?Social History  ? ?Tobacco Use  ? Smoking status: Never  ? Smokeless tobacco: Never  ?Substance Use Topics  ? Alcohol use: Yes  ?  Comment: socially  ? ?Marital Status: Married  ?ROS  ?Review of Systems  ?Cardiovascular:  Positive for chest pain, dyspnea on exertion and palpitations. Negative for leg swelling.  ?Objective  ?Blood pressure (!) 153/99, pulse 67, temperature 98.5 ?F (36.9 ?C), temperature source Temporal, resp. rate 17, height 5\' 7"  (1.702 m), weight 201 lb 12.8 oz (91.5 kg), SpO2 98 %. Body mass index is 31.61 kg/m?.  ? ?  11/13/2021  ?  9:16 AM 11/13/2021  ?  9:11 AM 11/01/2021  ?  2:18 AM  ?Vitals with BMI  ?Height  5\' 7"    ?Weight  201 lbs 13 oz   ?BMI  31.6   ?Systolic 0000000 [id number]  ?Diastolic 99 92 91  ?Pulse 67 63 64  ?  ? Physical Exam ?Neck:  ?   Vascular: No JVD.  ?Cardiovascular:  ?  Rate and Rhythm: Normal rate and regular rhythm.  ?   Pulses: Intact distal pulses.  ?   Heart sounds: A midsystolic click. No murmur heard. ?  No gallop.  ?Pulmonary:  ?   Effort: Pulmonary effort is normal.  ?   Breath sounds: Normal breath sounds.  ?Abdominal:  ?   General: Bowel sounds are normal.  ?   Palpations: Abdomen is soft.  ?Musculoskeletal:  ?   Right lower leg: No edema.  ?   Left lower leg: No edema.  ? ? ?Medications and allergies  ? ?Allergies  ?Allergen Reactions  ? Penicillins Hives and Rash  ?  REACTION: Rash  ?  ? ?Medication list after today's encounter  ? ?Current Outpatient Medications:  ?  Ascorbic Acid (VITAMIN C PO), Take 1,000 mg by mouth daily at 6 (six) AM., Disp: , Rfl:  ?  ELDERBERRY PO, Take 1 tablet by mouth daily at 6 (six) AM., Disp: , Rfl:  ?  metoprolol tartrate (LOPRESSOR) 50 MG tablet, Take 50 mg by mouth 2 (two) times daily., Disp: , Rfl:  ?  olmesartan  (BENICAR) 40 MG tablet, Take 1 tablet (40 mg total) by mouth daily., Disp: 30 tablet, Rfl: 2 ?  rosuvastatin (CRESTOR) 20 MG tablet, Take 1 tablet (20 mg total) by mouth daily. Must keep scheduled appt for future refills, Disp: 90 tablet, Rfl: 1 ?  Cholecalciferol 1.25 MG (50000 UT) capsule, Take 1 capsule (50,000 Units total) by mouth once a week., Disp: 12 capsule, Rfl: 0 ? ?Laboratory examination:  ? ?Recent Labs  ?  10/22/21 ?2222 10/22/21 ?2228 10/27/21 ?1216 11/01/21 ?0130  ?NA 140 143 138 134*  ?K 3.5 3.4* 3.8 3.5  ?CL 109 107 100 101  ?CO2 22  --  23 21*  ?GLUCOSE 101* 101* 128* 131*  ?BUN 22* 23* 21* 23*  ?CREATININE 1.28* 1.30* 1.47* 1.34*  ?CALCIUM 9.5  --  10.7* 9.5  ?GFRNONAA >60  --  58* >60  ? ?estimated creatinine clearance is 71.2 mL/min (A) (by C-G formula based on SCr of 1.34 mg/dL (H)).  ? ?  Latest Ref Rng & Units 11/01/2021  ?  1:30 AM 10/27/2021  ? 12:16 PM 10/22/2021  ? 10:28 PM  ?CMP  ?Glucose 70 - 99 mg/dL 131   128   101    ?BUN 6 - 20 mg/dL 23   21   23     ?Creatinine 0.61 - 1.24 mg/dL 1.34   1.47   1.30    ?Sodium 135 - 145 mmol/L 134   138   143    ?Potassium 3.5 - 5.1 mmol/L 3.5   3.8   3.4    ?Chloride 98 - 111 mmol/L 101   100   107    ?CO2 22 - 32 mmol/L 21   23     ?Calcium 8.9 - 10.3 mg/dL 9.5   10.7     ? ? ?  Latest Ref Rng & Units 11/01/2021  ?  1:30 AM 10/27/2021  ? 12:16 PM 10/22/2021  ? 10:28 PM  ?CBC  ?WBC 4.0 - 10.5 K/uL 12.3   8.5     ?Hemoglobin 13.0 - 17.0 g/dL 17.7   18.6   16.0    ?Hematocrit 39.0 - 52.0 % 50.1   53.5   47.0    ?Platelets 150 - 400 K/uL 199   185     ? ? ?Lipid Panel ?Recent Labs  ?  04/01/21 ?1151  ?  CHOL 214*  ?TRIG 413.0 Triglyceride is over 400; calculations on Lipids are invalid.*  ?HDL 33.10*  ?CHOLHDL 6  ?LDLDIRECT 84.0  ? ? ?HEMOGLOBIN A1C ?Lab Results  ?Component Value Date  ? HGBA1C 5.2 04/01/2021  ? ?TSH ?Recent Labs  ?  04/01/21 ?1151  ?TSH 0.55  ? ? ?Radiology:  ? ?Chest x-ray two-view 11/01/2021: ?The heart size and mediastinal contours are within  normal limits. ?Both lungs are clear. The visualized skeletal structures are ?unremarkable. ? ?CT angiogram of head and neck 10/22/2021:  ?No large vessel occlusion, chronic microvascular changes consistent with hypertension, aortic atherosclerosis. ? ?Cardiac Studies:  ? ?EKG:  ? ?EKG 11/13/2021: Normal sinus rhythm at rate of 64 bpm, incomplete right bundle branch block.  Otherwise normal EKG.   ? ?Assessment  ? ?  ICD-10-CM   ?1. Precordial chest pain  R07.2 PCV ECHOCARDIOGRAM COMPLETE  ?  CT CARDIAC SCORING ([facility] LOCATIONS ONLY)  ?  ?2. Rapid palpitations  R00.2   ?  ?3. Aortic atherosclerosis (HCC)  I70.0 PCV ECHOCARDIOGRAM COMPLETE  ?  CT CARDIAC SCORING ([facility] LOCATIONS ONLY)  ?  ?4. Familial hypertriglyceridemia  E78.1 LDL cholesterol, direct  ?  Lipid Panel With LDL/HDL Ratio  ?  ?5. Primary hypertension  I10 EKG 12-Lead  ?  olmesartan (BENICAR) 40 MG tablet  ?  Basic metabolic panel  ?  ?6. Abnormal heart sounds  R01.2   ?  ?  ?Medications Discontinued During This Encounter  ?Medication Reason  ? metoprolol tartrate (LOPRESSOR) 50 MG tablet Duplicate  ? triamterene-hydrochlorothiazide (DYAZIDE) 37.5-25 MG capsule Discontinued by provider  ? vardenafil (LEVITRA) 20 MG tablet Patient Preference  ?  ?Meds ordered this encounter  ?Medications  ? olmesartan (BENICAR) 40 MG tablet  ?  Sig: Take 1 tablet (40 mg total) by mouth daily.  ?  Dispense:  30 tablet  ?  Refill:  2  ? ?Orders Placed This Encounter  ?Procedures  ? CT CARDIAC SCORING ([facility] LOCATIONS ONLY)  ?  Standing Status:   Future  ?  Standing Expiration Date:   01/13/2022  ?  Order Specific Question:   Preferred imaging location?  ?  Answer:   [facility]  ? LDL cholesterol, direct  ? Lipid Panel With LDL/HDL Ratio  ? Basic metabolic panel  ? EKG 12-Lead  ? PCV ECHOCARDIOGRAM COMPLETE  ?  Standing Status:   Future  ?  Standing Expiration Date:   11/14/2022  ? ?Recommendations:  ? ?Zaeem Winstanley is a 51 y.o.  African-American male patient with hypertension, is  referred to me for evaluation of precordial pain, palpitations, and hypertension.  He was also seen in the emergency room with palpitations on 10/27/2021.  He now presents for consultation regarding the same.  Patient has

## 2021-11-13 NOTE — Progress Notes (Signed)
Patient ID: Wayne Morales, male   DOB: [birth year], 51 y.o.   MRN: [id number] ? ? ? ?    Chief Complaint: follow up anxiety, angioedema, palpitations ? ?     HPI:  Wayne Morales is a 51 y.o. male here with c/o 2 days onset mild diffuse facial swelling with itching and mild redness, but no fever, chills, HA, ST, cough, ear pain or sinus symptoms, and no lip or tonugue or throat swelling. Started after used a topical gel to the face, mostly about the eyes.   Pt denies chest pain, increased sob or doe, wheezing, orthopnea, PND, increased LE swelling, palpitations, dizziness or syncope.   Pt denies polydipsia, polyuria, or new focal neuro s/s.    Pt denies fever, wt loss, night sweats, loss of appetite, or other constitutional symptoms  Denies worsening depressive symptoms, suicidal ideation, or panic; has ongoing anxiety, not clear if related to recent palpitaions, for which he saw cardiology earlier today, and has orders for cardiac monitor and Ct Cardiac score.  Pt does not endorse much worsening stressors overall but is moving his home currently.  ?Wt Readings from Last 3 Encounters:  ?11/13/21 202 lb (91.6 kg)  ?11/13/21 201 lb 12.8 oz (91.5 kg)  ?11/01/21 190 lb (86.2 kg)  ? ?BP Readings from Last 3 Encounters:  ?11/13/21 132/70  ?11/13/21 (!) 153/99  ?11/01/21 (!) 130/91  ? ?      ?Past Medical History:  ?Diagnosis Date  ? Atrial fibrillation (HCC)   ? Chronic back pain   ? Hyperlipidemia   ? diet controlled  ? Hypertension   ? on meds  ? Scalp psoriasis 12/15/2012  ? ?Past Surgical History:  ?Procedure Laterality Date  ? INGUINAL HERNIA REPAIR Left   ? as a child  ? ? reports that he has never smoked. He has never used smokeless tobacco. He reports current alcohol use. He reports that he does not use drugs. ?family history includes Brain cancer in his brother; Colon cancer (age of onset: 65) in his father; Colon polyps (age of onset: 48) in his father; Diabetes in his father and another family member; Heart  disease in his brother; Hyperlipidemia in his mother and another family member; Hypertension in his mother and sister; Sarcoidosis in his brother and brother. ?Allergies  ?Allergen Reactions  ? Penicillins Hives and Rash  ?  REACTION: Rash  ? ?Current Outpatient Medications on File Prior to Visit  ?Medication Sig Dispense Refill  ? Ascorbic Acid (VITAMIN C PO) Take 1,000 mg by mouth daily at 6 (six) AM.    ? Cholecalciferol 1.25 MG (50000 UT) capsule Take 1 capsule (50,000 Units total) by mouth once a week. 12 capsule 0  ? metoprolol tartrate (LOPRESSOR) 50 MG tablet Take 50 mg by mouth 2 (two) times daily.    ? olmesartan (BENICAR) 40 MG tablet Take 1 tablet (40 mg total) by mouth daily. 30 tablet 2  ? rosuvastatin (CRESTOR) 20 MG tablet Take 1 tablet (20 mg total) by mouth daily. Must keep scheduled appt for future refills 90 tablet 1  ? ?No current facility-administered medications on file prior to visit.  ? ?     ROS:  All others reviewed and negative. ? ?Objective  ? ?     PE:  BP 132/70 (BP Location: Right Arm, Patient Position: Sitting, Cuff Size: Large)   Pulse 61   Temp 98.2 ?F (36.8 ?C) (Oral)   Ht 5\' 7"  (1.702 m)   Wt 202 lb (  91.6 kg)   SpO2 98%   BMI 31.64 kg/m?  ? ?              Constitutional: Pt appears in NAD ?              HENT: Head: NCAT.  ?              Right Ear: External ear normal.   ?              Left Ear: External ear normal.  ?              Eyes: . Pupils are equal, round, and reactive to light. Conjunctivae and EOM are normal ?              Nose: without d/c or deformity ?              Neck: Neck supple. Gross normal ROM ?              Cardiovascular: Normal rate and regular rhythm.   ?              Pulmonary/Chest: Effort normal and breath sounds without rales or wheezing.  ?              Abd:  Soft, NT, ND, + BS, no organomegaly ?              Neurological: Pt is alert. At baseline orientation, motor grossly intact ?              Skin: LE edema - none, has mild diffuse facial  angioedema slightly worse periorbital ?              Psychiatric: Pt behavior is normal without agitation but 1-2+ nervous ? ?Micro: none ? ?Cardiac tracings I have personally interpreted today:  none ? ?Pertinent Radiological findings (summarize): none  ? ?Lab Results  ?Component Value Date  ? WBC 12.3 (H) 11/01/2021  ? HGB 17.7 (H) 11/01/2021  ? HCT 50.1 11/01/2021  ? PLT 199 11/01/2021  ? GLUCOSE 131 (H) 11/01/2021  ? CHOL 214 (H) 04/01/2021  ? TRIG (H) 04/01/2021  ?  413.0 Triglyceride is over 400; calculations on Lipids are invalid.  ? HDL 33.10 (L) 04/01/2021  ? LDLDIRECT 84.0 04/01/2021  ? LDLCALC 146 09/25/2019  ? ALT 36 10/22/2021  ? AST 30 10/22/2021  ? NA 134 (L) 11/01/2021  ? K 3.5 11/01/2021  ? CL 101 11/01/2021  ? CREATININE 1.34 (H) 11/01/2021  ? BUN 23 (H) 11/01/2021  ? CO2 21 (L) 11/01/2021  ? TSH 0.55 04/01/2021  ? PSA 0.4 09/25/2019  ? INR 0.9 10/22/2021  ? HGBA1C 5.2 04/01/2021  ? ?Assessment/Plan:  ?Wayne Morales is a 51 y.o. Other or two or more races [6] male with  has a past medical history of Atrial fibrillation (HCC), Chronic back pain, Hyperlipidemia, Hypertension, and Scalp psoriasis (12/15/2012). ? ?Angioedema ?Mild facial edema, swelling after topical solution exposure; for depomedrol Im 80, and topical triam cr prn ? ?Anxiety ?Mild to mod, for trial xanax .25 bid prn, declines ssri ? ?Palpitations ?Pt encouraged to f/u as planned with card monitor and CT cardiac score ? ?Followup: Return if symptoms worsen or fail to improve. ? ?Wayne Barre, MD 11/14/2021 11:08 AM ?[facility] ?[facility] ?Internal Medicine ?

## 2021-11-14 ENCOUNTER — Encounter: Payer: Self-pay | Admitting: Internal Medicine

## 2021-11-14 DIAGNOSIS — R002 Palpitations: Secondary | ICD-10-CM | POA: Insufficient documentation

## 2021-11-14 DIAGNOSIS — T783XXA Angioneurotic edema, initial encounter: Secondary | ICD-10-CM | POA: Insufficient documentation

## 2021-11-14 DIAGNOSIS — F419 Anxiety disorder, unspecified: Secondary | ICD-10-CM | POA: Insufficient documentation

## 2021-11-14 NOTE — Assessment & Plan Note (Addendum)
Mild to mod, for trial xanax .25 bid prn, declines ssri ?

## 2021-11-14 NOTE — Assessment & Plan Note (Signed)
Pt encouraged to f/u as planned with card monitor and CT cardiac score ?

## 2021-11-14 NOTE — Assessment & Plan Note (Addendum)
Mild facial edema, swelling after topical solution exposure; for depomedrol Im 80, and topical triam cr prn ?

## 2021-11-18 ENCOUNTER — Ambulatory Visit: Payer: 59 | Admitting: Neurology

## 2021-11-18 ENCOUNTER — Encounter: Payer: Self-pay | Admitting: Neurology

## 2021-11-18 VITALS — BP 146/87 | HR 71 | Ht 67.0 in | Wt 196.5 lb

## 2021-11-18 DIAGNOSIS — R000 Tachycardia, unspecified: Secondary | ICD-10-CM

## 2021-11-18 DIAGNOSIS — F439 Reaction to severe stress, unspecified: Secondary | ICD-10-CM

## 2021-11-18 NOTE — Progress Notes (Signed)
? ?[facility] ? ?PATIENT: Wayne Morales ?DOB: [birth year] ? ?REQUESTING CLINICIAN: Mesner, Corene Cornea, MD ?HISTORY FROM: Patient  ?REASON FOR VISIT: Stroke like symptoms  ? ? ?HISTORICAL ? ?CHIEF COMPLAINT:  ?Chief Complaint  ?Patient presents with  ? New Patient (Initial Visit)  ?  Rm 12. Alone. ?NP/internal ED referral for stroke like symptoms.  ? ? ?HISTORY OF PRESENT ILLNESS:  ?This is a 51 year old gentleman with past medical history of hypertension hyperlipidemia who is presenting after being seen in the ED for palpitations and generalized weakness.  Patient reported waking up in the morning of April 6 with heart racing, he checked his pulse and it was in the Q000111Q systolic, at that time he was also experiencing fatigue (felt like he was running) and now chest pain.  He got concerned and called EMS.  By the time he reached the hospital an hour later his symptoms resolved.  In the ED work-up was completed including MRI brain and CTA head and neck which were all negative, no strokes.  He did follow-up with cardiology and currently using a monitor for the next 2 weeks.   ?During this time patient also reports some increase stressors, his 46 year old son has left the house, he did have some deaths in his immediate circle, this is people around his age so he got them worrying and also he just turned 65 and is worried about his own health.  He denies any focal weakness, denies any headaches, denies any change in vision, denies any numbness or tingling.   ? ? ?OTHER MEDICAL CONDITIONS: Hypertension, Hyperlipidemia  ? ? ?REVIEW OF SYSTEMS: Full 14 system review of systems performed and negative with exception of: as noted in the HPI  ? ?ALLERGIES: ?Allergies  ?Allergen Reactions  ? Penicillins Hives and Rash  ?  REACTION: Rash  ? ? ?HOME MEDICATIONS: ?Outpatient Medications Prior to Visit  ?Medication Sig Dispense Refill  ? ALPRAZolam (XANAX) 0.25 MG tablet Take 1 tablet (0.25 mg total) by mouth 2  (two) times daily as needed for anxiety. 30 tablet 0  ? Ascorbic Acid (VITAMIN C PO) Take 1,000 mg by mouth daily at 6 (six) AM.    ? olmesartan (BENICAR) 40 MG tablet Take 1 tablet (40 mg total) by mouth daily. 30 tablet 2  ? rosuvastatin (CRESTOR) 20 MG tablet Take 1 tablet (20 mg total) by mouth daily. Must keep scheduled appt for future refills 90 tablet 1  ? Cholecalciferol 1.25 MG (50000 UT) capsule Take 1 capsule (50,000 Units total) by mouth once a week. 12 capsule 0  ? metoprolol tartrate (LOPRESSOR) 50 MG tablet Take 50 mg by mouth 2 (two) times daily.    ? triamcinolone cream (KENALOG) 0.1 % Apply 1 application. topically 2 (two) times daily. 30 g 0  ? ?No facility-administered medications prior to visit.  ? ? ?PAST MEDICAL HISTORY: ?Past Medical History:  ?Diagnosis Date  ? Atrial fibrillation ([place])   ? Chronic back pain   ? Hyperlipidemia   ? diet controlled  ? Hypertension   ? on meds  ? Scalp psoriasis 12/15/2012  ? ? ?PAST SURGICAL HISTORY: ?Past Surgical History:  ?Procedure Laterality Date  ? INGUINAL HERNIA REPAIR Left   ? as a child  ? ? ?FAMILY HISTORY: ?Family History  ?Problem Relation Age of Onset  ? Hypertension Mother   ? Hyperlipidemia Mother   ? Diabetes Father   ? Colon polyps Father 34  ? Colon cancer Father 67  ? Hypertension Sister   ?  Heart disease Brother   ? Brain cancer Brother   ? Sarcoidosis Brother   ? Sarcoidosis Brother   ? Diabetes Other   ? Hyperlipidemia Other   ? Early death Neg Hx   ? Kidney disease Neg Hx   ? Stroke Neg Hx   ? Stomach cancer Neg Hx   ? Rectal cancer Neg Hx   ? Esophageal cancer Neg Hx   ? ? ?SOCIAL HISTORY: ?Social History  ? ?Socioeconomic History  ? Marital status: Married  ?  Spouse name: Not on file  ? Number of children: 1  ? Years of education: Not on file  ? Highest education level: Not on file  ?Occupational History  ? Not on file  ?Tobacco Use  ? Smoking status: Never  ? Smokeless tobacco: Never  ?Vaping Use  ? Vaping Use: Never used   ?Substance and Sexual Activity  ? Alcohol use: Yes  ?  Comment: socially  ? Drug use: Never  ? Sexual activity: Yes  ?Other Topics Concern  ? Not on file  ?Social History Narrative  ? Child passed due to brain and spinal cancer  ? ?Social Determinants of Health  ? ?Financial Resource Strain: Not on file  ?Food Insecurity: Not on file  ?Transportation Needs: Not on file  ?Physical Activity: Not on file  ?Stress: Not on file  ?Social Connections: Not on file  ?Intimate Partner Violence: Not on file  ? ? ?PHYSICAL EXAM ? ?GENERAL EXAM/CONSTITUTIONAL: ?Vitals:  ?Vitals:  ? 11/18/21 0813  ?BP: (!) 146/87  ?Pulse: 71  ?Weight: 196 lb 8 oz (89.1 kg)  ?Height: 5\' 7"  (1.702 m)  ? ?Body mass index is 30.78 kg/m?. ?Wt Readings from Last 3 Encounters:  ?11/18/21 196 lb 8 oz (89.1 kg)  ?11/13/21 202 lb (91.6 kg)  ?11/13/21 201 lb 12.8 oz (91.5 kg)  ? ?Patient is in no distress; well developed, nourished and groomed; neck is supple ? ?CARDIOVASCULAR: ?Examination of carotid arteries is normal; no carotid bruits ?Regular rate and rhythm, no murmurs ?Examination of peripheral vascular system by observation and palpation is normal ? ?EYES: ?Pupils round and reactive to light, Visual fields full to confrontation, Extraocular movements intacts,  ? ?MUSCULOSKELETAL: ?Gait, strength, tone, movements noted in Neurologic exam below ? ?NEUROLOGIC: ?MENTAL STATUS:  ?   ? View : No data to display.  ?  ?  ?  ? ?awake, alert, oriented to person, place and time ?recent and remote memory intact ?normal attention and concentration ?language fluent, comprehension intact, naming intact ?fund of knowledge appropriate ? ?CRANIAL NERVE:  ?2nd, 3rd, 4th, 6th - pupils equal and reactive to light, visual fields full to confrontation, extraocular muscles intact, no nystagmus ?5th - facial sensation symmetric ?7th - facial strength symmetric ?8th - hearing intact ?9th - palate elevates symmetrically, uvula midline ?11th - shoulder shrug symmetric ?12th -  tongue protrusion midline ? ?MOTOR:  ?normal bulk and tone, full strength in the BUE, BLE ? ?SENSORY:  ?normal and symmetric to light touch, pinprick, temperature, vibration ? ?COORDINATION:  ?finger-nose-finger, fine finger movements normal ? ?REFLEXES:  ?deep tendon reflexes present and symmetric ? ?GAIT/STATION:  ?normal ? ? ?DIAGNOSTIC DATA (LABS, IMAGING, TESTING) ?- I reviewed patient records, labs, notes, testing and imaging myself where available. ? ?Lab Results  ?Component Value Date  ? WBC 12.3 (H) 11/01/2021  ? HGB 17.7 (H) 11/01/2021  ? HCT 50.1 11/01/2021  ? MCV 90.8 11/01/2021  ? PLT 199 11/01/2021  ? ?   ?  Component Value Date/Time  ? NA 134 (L) 11/01/2021 0130  ? K 3.5 11/01/2021 0130  ? CL 101 11/01/2021 0130  ? CO2 21 (L) 11/01/2021 0130  ? GLUCOSE 131 (H) 11/01/2021 0130  ? BUN 23 (H) 11/01/2021 0130  ? BUN 16 09/25/2019 0000  ? CREATININE 1.34 (H) 11/01/2021 0130  ? CALCIUM 9.5 11/01/2021 0130  ? PROT 7.0 10/22/2021 2222  ? ALBUMIN 4.4 10/22/2021 2222  ? AST 30 10/22/2021 2222  ? ALT 36 10/22/2021 2222  ? ALKPHOS 78 10/22/2021 2222  ? BILITOT 0.7 10/22/2021 2222  ? GFRNONAA >60 11/01/2021 0130  ? ?Lab Results  ?Component Value Date  ? CHOL 214 (H) 04/01/2021  ? HDL 33.10 (L) 04/01/2021  ? [place] 146 09/25/2019  ? LDLDIRECT 84.0 04/01/2021  ? TRIG (H) 04/01/2021  ?  413.0 Triglyceride is over 400; calculations on Lipids are invalid.  ? CHOLHDL 6 04/01/2021  ? ?Lab Results  ?Component Value Date  ? HGBA1C 5.2 04/01/2021  ? ?No results found for: VITAMINB12 ?Lab Results  ?Component Value Date  ? TSH 0.55 04/01/2021  ? ? ? ?MRI Brain 10/22/2021 ?1. No acute finding. ?2. Mild chronic white matter disease, history of hypertension suggesting chronic microvascular ischemia. ? ?CTA Head and Neck 10/22/21 ?1. No acute hemorrhage or mass lesion. ASPECTS is 10. ?2. No emergent large vessel occlusion or high-grade stenosis of the head or neck. ? ? ? ? ?ASSESSMENT AND PLAN ? ?51 y.o. year old male with history of  hypertension hyperlipidemia who is presenting after being seen in the hospital for palpitations and generalized weakness.  Initial work-up including CT angiogram head and neck and MRI brain was negative for any s

## 2021-11-18 NOTE — Patient Instructions (Signed)
Continue with your current medications ?Follow-up with cardiology as scheduled ?Recommend setting up care with a therapist to help manage stress ?Return as needed. ?

## 2021-11-19 ENCOUNTER — Ambulatory Visit: Payer: 59

## 2021-11-19 DIAGNOSIS — I700 Atherosclerosis of aorta: Secondary | ICD-10-CM

## 2021-11-19 DIAGNOSIS — R072 Precordial pain: Secondary | ICD-10-CM

## 2021-12-15 ENCOUNTER — Ambulatory Visit
Admission: RE | Admit: 2021-12-15 | Discharge: 2021-12-15 | Disposition: A | Discharge status: Home / Self Care | Payer: No Typology Code available for payment source | Source: Ambulatory Visit | Attending: Cardiology | Admitting: Cardiology

## 2021-12-15 DIAGNOSIS — I700 Atherosclerosis of aorta: Secondary | ICD-10-CM

## 2021-12-15 DIAGNOSIS — R072 Precordial pain: Secondary | ICD-10-CM

## 2021-12-15 NOTE — Progress Notes (Signed)
Coronary calcium score 12/15/2021: LM 0 LAD 42.6 LCx 43.2 RCA 43.2 Total Agatston score is 129, MESA database percentile 96. Ascending and descending aorta measured normal. Small solitary pulmonary nodule of the left lower lobe measuring 4 mm, no follow-up needed if low risk.  No other significant extracardiac abnormality.

## 2021-12-25 ENCOUNTER — Ambulatory Visit: Payer: 59 | Admitting: Cardiology

## 2021-12-25 ENCOUNTER — Encounter: Payer: Self-pay | Admitting: Cardiology

## 2021-12-25 VITALS — BP 133/91 | HR 77 | Temp 98.1°F | Resp 16 | Ht 67.0 in | Wt 201.2 lb

## 2021-12-25 DIAGNOSIS — I10 Essential (primary) hypertension: Secondary | ICD-10-CM

## 2021-12-25 DIAGNOSIS — R931 Abnormal findings on diagnostic imaging of heart and coronary circulation: Secondary | ICD-10-CM

## 2021-12-25 DIAGNOSIS — R002 Palpitations: Secondary | ICD-10-CM

## 2021-12-25 DIAGNOSIS — E781 Pure hyperglyceridemia: Secondary | ICD-10-CM

## 2021-12-25 DIAGNOSIS — R072 Precordial pain: Secondary | ICD-10-CM

## 2021-12-25 MED ORDER — DILTIAZEM HCL ER COATED BEADS 240 MG PO CP24: 240.0000 mg | ORAL_CAPSULE | Freq: Every day | ORAL | 3 refills | Status: DC

## 2021-12-25 MED ORDER — OMEGA-3-ACID ETHYL ESTERS 1 G PO CAPS: 2.0000 g | ORAL_CAPSULE | Freq: Two times a day (BID) | ORAL | 2 refills | Status: DC

## 2021-12-25 NOTE — Progress Notes (Signed)
Primary Physician/Referring:  Janith Lima, MD  Patient ID: Wayne Morales, male    DOB: 02-15-1971, 51 y.o.   MRN: [id number]  Chief Complaint  Patient presents with   Chest Pain   Palpitations   Hyperlipidemia   Follow-up   HPI:    Wayne Morales  is a 51 y.o. African-American male patient with hypertension, is referred to me for evaluation of precordial pain, palpitations, and hypertension.  He was also seen in the emergency room with palpitations on 10/27/2021.  Patient has been to the emergency room 3 times over the past few months either with palpitations or throbbing neck pain, blood pressure issues.  Patient states that over the past 6 months, he has started noticing radiation and fluctuation in blood pressure, started having rapid palpitations that are on and off quickly that last anywhere from 5 minutes maximum of 20 to 30 minutes.  Associated with shortness of breath and chest discomfort.    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 He has not had further chest pain since last OV and states he has not returned to full duty due to his symptoms. His wife is present today.   Past Medical History:  Diagnosis Date   Atrial fibrillation ([place])    Chronic back pain    Hyperlipidemia    diet controlled   Hypertension    on meds   Scalp psoriasis 12/15/2012   Past Surgical History:  Procedure Laterality Date   INGUINAL HERNIA REPAIR Left    as a child   Family History  Problem Relation Age of Onset   Hypertension Mother    Hyperlipidemia Mother    Supraventricular tachycardia Mother    Diabetes Father    Colon polyps Father 3   Colon cancer Father 29   Hypertension Sister    Brain cancer Brother    Heart disease Brother    Sarcoidosis Brother    Sarcoidosis Brother     Social History   Tobacco Use    Smoking status: Never   Smokeless tobacco: Never  Substance Use Topics   Alcohol use: Yes    Comment: socially   Marital Status: Married  ROS  Review of Systems  Cardiovascular:  Positive for chest pain, dyspnea on exertion and palpitations. Negative for leg swelling.   Objective  Blood pressure (!) 133/91, pulse 77, temperature 98.1 F (36.7 C), temperature source Temporal, resp. rate 16, height 5\' 7"  (1.702 m), weight 201 lb 3.2 oz (91.3 kg), SpO2 95 %. Body mass index is 31.51 kg/m.     12/25/2021   11:09 AM 12/25/2021   11:08 AM 11/18/2021    8:13 AM  Vitals with BMI  Height  5\' 7"  5\' 7"   Weight  201 lbs 3 oz 196 lbs 8 oz  BMI  [id number]  Systolic [id number]  Diastolic 91 91 87  Pulse 77 80 71     Physical Exam Neck:     Vascular: No JVD.  Cardiovascular:     Rate and Rhythm: Normal rate and regular rhythm.     Pulses: Intact distal pulses.     Heart sounds: A midsystolic click. No murmur heard.    No gallop.  Pulmonary:     Effort: Pulmonary effort is normal.     Breath sounds: Normal breath sounds.  Abdominal:  General: Bowel sounds are normal.     Palpations: Abdomen is soft.  Musculoskeletal:     Right lower leg: No edema.     Left lower leg: No edema.    Medications and allergies   Allergies  Allergen Reactions   Penicillins Hives and Rash    REACTION: Rash    Medication list after today's encounter   Current Outpatient Medications:    ALPRAZolam (XANAX) 0.25 MG tablet, Take 1 tablet (0.25 mg total) by mouth 2 (two) times daily as needed for anxiety., Disp: 30 tablet, Rfl: 0   Ascorbic Acid (VITAMIN C PO), Take 1,000 mg by mouth daily at 6 (six) AM., Disp: , Rfl:    diltiazem (CARDIZEM CD) 240 MG 24 hr capsule, Take 1 capsule (240 mg total) by mouth daily., Disp: 90 capsule, Rfl: 3   olmesartan (BENICAR) 40 MG tablet, Take 1 tablet (40 mg total) by mouth daily., Disp: 30 tablet, Rfl: 2   omega-3 acid ethyl esters (LOVAZA) 1 g capsule, Take 2  capsules (2 g total) by mouth 2 (two) times daily., Disp: 360 capsule, Rfl: 2   rosuvastatin (CRESTOR) 20 MG tablet, Take 1 tablet (20 mg total) by mouth daily. Must keep scheduled appt for future refills, Disp: 90 tablet, Rfl: 1  Laboratory examination:   Recent Labs    10/22/21 2222 10/22/21 2228 10/27/21 1216 11/01/21 0130  NA 140 143 138 134*  K 3.5 3.4* 3.8 3.5  CL 109 107 100 101  CO2 22  --  23 21*  GLUCOSE 101* 101* 128* 131*  BUN 22* 23* 21* 23*  CREATININE 1.28* 1.30* 1.47* 1.34*  CALCIUM 9.5  --  10.7* 9.5  GFRNONAA >60  --  58* >60   CrCl cannot be calculated (Patient's most recent lab result is older than the maximum 21 days allowed.).     Latest Ref Rng & Units 11/01/2021    1:30 AM 10/27/2021   12:16 PM 10/22/2021   10:28 PM  CMP  Glucose 70 - 99 mg/dL 131  128  101   BUN 6 - 20 mg/dL 23  21  23    Creatinine 0.61 - 1.24 mg/dL 1.34  1.47  1.30   Sodium 135 - 145 mmol/L 134  138  143   Potassium 3.5 - 5.1 mmol/L 3.5  3.8  3.4   Chloride 98 - 111 mmol/L 101  100  107   CO2 22 - 32 mmol/L 21  23    Calcium 8.9 - 10.3 mg/dL 9.5  10.7        Latest Ref Rng & Units 11/01/2021    1:30 AM 10/27/2021   12:16 PM 10/22/2021   10:28 PM  CBC  WBC 4.0 - 10.5 K/uL 12.3  8.5    Hemoglobin 13.0 - 17.0 g/dL 17.7  18.6  16.0   Hematocrit 39.0 - 52.0 % 50.1  53.5  47.0   Platelets 150 - 400 K/uL 199  185     Lipid Panel Recent Labs    04/01/21 1151  CHOL 214*  TRIG 413.0 Triglyceride is over 400; calculations on Lipids are invalid.*  HDL 33.10*  CHOLHDL 6  LDLDIRECT 84.0    HEMOGLOBIN A1C Lab Results  Component Value Date   HGBA1C 5.2 04/01/2021   TSH Recent Labs    04/01/21 1151  TSH 0.55   Radiology:   Chest x-ray two-view 11/01/2021: The heart size and mediastinal contours are within normal limits. Both lungs are clear. The  visualized skeletal structures are unremarkable.  CT angiogram of head and neck 10/22/2021:  No large vessel occlusion, chronic  microvascular changes consistent with hypertension, aortic atherosclerosis.  Cardiac Studies:   Ambulatory cardiac telemetry 7 days (11/13/21 - 11/19/2021): Predominant underlying rhythm was sinus with rare PACs and PVCs.  Patient triggered events correlated with normal sinus rhythm.  Occasional PVC. No evidence of significant cardiac arrhythmias including SVT, A-fib, high degree AV block, pauses >3 seconds, or ventricular tachycardia.  PCV ECHOCARDIOGRAM COMPLETE 11/19/2021  Normal LV systolic function with visual EF 60-65%. Left ventricle cavity is normal in size. Mild left ventricular hypertrophy. Normal global wall motion. Normal diastolic filling pattern, normal LAP. Calculated EF 65%. No significant valvular heart disease. No prior study for comparison.  Coronary calcium score 12/15/2021: LM 0 LAD 42.6 LCx 43.2 RCA 43.2 Total Agatston score is 129, MESA database percentile 96. Ascending and descending aorta measured normal. Small solitary pulmonary nodule of the left lower lobe measuring 4 mm, no follow-up needed if low risk.  No other significant extracardiac abnormality.    EKG:   EKG 11/13/2021: Normal sinus rhythm at rate of 64 bpm, incomplete right bundle branch block.  Otherwise normal EKG.    Assessment     ICD-10-CM   1. Rapid palpitations  R00.2 diltiazem (CARDIZEM CD) 240 MG 24 hr capsule    2. Precordial chest pain  R07.2 PCV MYOCARDIAL PERFUSION WO LEXISCAN    3. Primary hypertension  I10 diltiazem (CARDIZEM CD) 240 MG 24 hr capsule    4. Elevated coronary artery calcium score 12/15/2021: Total Agatston score is 129, MESA database percentile 96.  R93.1 PCV MYOCARDIAL PERFUSION WO LEXISCAN    omega-3 acid ethyl esters (LOVAZA) 1 g capsule    5. Familial hypertriglyceridemia  E78.1 PCV MYOCARDIAL PERFUSION WO LEXISCAN    omega-3 acid ethyl esters (LOVAZA) 1 g capsule      There are no discontinued medications.   Meds ordered this encounter  Medications    diltiazem (CARDIZEM CD) 240 MG 24 hr capsule    Sig: Take 1 capsule (240 mg total) by mouth daily.    Dispense:  90 capsule    Refill:  3   omega-3 acid ethyl esters (LOVAZA) 1 g capsule    Sig: Take 2 capsules (2 g total) by mouth 2 (two) times daily.    Dispense:  360 capsule    Refill:  2   Orders Placed This Encounter  Procedures   PCV MYOCARDIAL PERFUSION WO LEXISCAN    Standing Status:   Future    Standing Expiration Date:   02/24/2022   Recommendations:   Olen Christodoulou is a 51 y.o.  African-American male patient with hypertension, is referred to me for evaluation of precordial pain, palpitations, and hypertension.  He was also seen in the emergency room with palpitations on 10/27/2021.  Patient has been to the emergency room 3 times over the past few months either with palpitations or throbbing neck pain, blood pressure issues.  Zio patch reveals symptoms correlate with occasional PVCs and/or sinus rhythm.  No other significant arrhythmias noted. In view of hypertriglyceridemia I discontinued HCTZ component of Benicar, blood pressure is elevated, will add diltiazem CD 240 mg daily both for hypertension and also palpitations.  I reviewed his coronary calcium score, markedly elevated and in the 96 percentile placing him at a very high risk for underlying CAD.  In view of precordial chest pain, we will schedule him for exercise nuclear stress test.  I reviewed his lipids again, will start him on Lovaza 2 g twice daily, he will continue with statins for now, I have had a long discussion with him on his last office visit regarding lifestyle changes and dietary changes, which he has implemented now, his wife is present at the bedside.  We will repeat lipids in 4 to 6 weeks around like to see him back in 2 months for follow-up.    Adrian Prows, MD, [facility] 12/25/2021, 9:18 PM Office: [phone]

## 2022-01-13 ENCOUNTER — Ambulatory Visit: Payer: 59

## 2022-01-13 DIAGNOSIS — R072 Precordial pain: Secondary | ICD-10-CM

## 2022-01-13 DIAGNOSIS — E781 Pure hyperglyceridemia: Secondary | ICD-10-CM

## 2022-01-13 DIAGNOSIS — R931 Abnormal findings on diagnostic imaging of heart and coronary circulation: Secondary | ICD-10-CM

## 2022-01-14 ENCOUNTER — Encounter: Payer: Self-pay | Admitting: Cardiology

## 2022-01-14 NOTE — Telephone Encounter (Signed)
From patient.

## 2022-01-15 LAB — PCV MYOCARDIAL PERFUSION WO LEXISCAN
Angina Index: 0
Base ST Depression (mm): 0 mm
ST Depression (mm): 0 mm

## 2022-01-18 ENCOUNTER — Encounter: Payer: Self-pay | Admitting: Cardiology

## 2022-01-18 NOTE — Telephone Encounter (Signed)
Letter for return to work with no restriction from cardiac standpoint will be sent to the patient, patient cannot return to work as of 01/20/2022.

## 2022-01-18 NOTE — Telephone Encounter (Signed)
From patient.

## 2022-02-04 ENCOUNTER — Encounter: Payer: Self-pay | Admitting: Cardiology

## 2022-02-04 ENCOUNTER — Ambulatory Visit: Payer: 59 | Admitting: Cardiology

## 2022-02-04 VITALS — BP 123/75 | HR 73 | Temp 98.3°F | Resp 16 | Ht 67.0 in | Wt 199.2 lb

## 2022-02-04 DIAGNOSIS — E785 Hyperlipidemia, unspecified: Secondary | ICD-10-CM

## 2022-02-04 DIAGNOSIS — E781 Pure hyperglyceridemia: Secondary | ICD-10-CM

## 2022-02-04 DIAGNOSIS — R931 Abnormal findings on diagnostic imaging of heart and coronary circulation: Secondary | ICD-10-CM

## 2022-02-04 DIAGNOSIS — I10 Essential (primary) hypertension: Secondary | ICD-10-CM

## 2022-02-04 DIAGNOSIS — R002 Palpitations: Secondary | ICD-10-CM

## 2022-02-04 MED ORDER — OLMESARTAN MEDOXOMIL 40 MG PO TABS: 40.0000 mg | ORAL_TABLET | Freq: Every day | ORAL | 3 refills | Status: DC

## 2022-02-04 MED ORDER — OMEGA-3-ACID ETHYL ESTERS 1 G PO CAPS: 2.0000 g | ORAL_CAPSULE | Freq: Two times a day (BID) | ORAL | 3 refills | Status: DC

## 2022-02-04 NOTE — Progress Notes (Addendum)
Primary Physician/Referring:  Etta Grandchild, MD  Patient ID: Wayne Morales, male    DOB: 07-Jun-1971, 51 y.o.   MRN: [id number]  Chief Complaint  Patient presents with   Coronary Artery Disease   Hyperlipidemia   Results    Stress test   Follow-up   HPI:    Wayne Morales  is a 51 y.o. African-American male patient with hypertension, chronic palpitations, and hypertension.  He was also evaluated for precordial pain, coronary calcium score was markedly elevated placing him in 96 percentile in May 2023.  He also has familial hypertriglyceridemia.  Fortunately he has not had any major chest pain episodes, he feels chest pain when he feels palpitations.  Symptoms are improved since being on diltiazem.  Blood pressure is now well controlled as well.  He has made lifestyle changes with regard to his diet, given up most of the carbohydrates and also red meats.  He is now tolerating Lovaza 4 g/day and also Crestor.  Past Medical History:  Diagnosis Date   Atrial fibrillation ([facility])    Chronic back pain    Hyperlipidemia    diet controlled   Hypertension    on meds   Scalp psoriasis 12/15/2012   Past Surgical History:  Procedure Laterality Date   INGUINAL HERNIA REPAIR Left    as a child   Family History  Problem Relation Age of Onset   Hypertension Mother    Hyperlipidemia Mother    Supraventricular tachycardia Mother    Diabetes Father    Colon polyps Father 37   Colon cancer Father 23   Hypertension Sister    Brain cancer Brother    Heart disease Brother    Sarcoidosis Brother    Sarcoidosis Brother     Social History   Tobacco Use   Smoking status: Never   Smokeless tobacco: Never  Substance Use Topics   Alcohol use: Yes    Comment: socially   Marital Status: Married  ROS  Review of Systems  Cardiovascular:  Positive for palpitations. Negative for chest pain, dyspnea on exertion and leg swelling.   Objective  Blood pressure 123/75, pulse 73,  temperature 98.3 F (36.8 C), temperature source Temporal, resp. rate 16, height 5\' 7"  (1.702 m), weight 199 lb 3.2 oz (90.4 kg), SpO2 97 %. Body mass index is 31.2 kg/m.     02/04/2022    2:40 PM 12/25/2021   11:09 AM 12/25/2021   11:08 AM  Vitals with BMI  Height 5\' 7"   5\' 7"   Weight 199 lbs 3 oz  201 lbs 3 oz  BMI 31.19  31.51  Systolic 123 133 02/24/2022  Diastolic 75 91 91  Pulse 73 77 80     Physical Exam Neck:     Vascular: No JVD.  Cardiovascular:     Rate and Rhythm: Normal rate and regular rhythm.     Pulses: Intact distal pulses.     Heart sounds: A midsystolic click. No murmur heard.    No gallop.  Pulmonary:     Effort: Pulmonary effort is normal.     Breath sounds: Normal breath sounds.  Abdominal:     General: Bowel sounds are normal.     Palpations: Abdomen is soft.  Musculoskeletal:     Right lower leg: No edema.     Left lower leg: No edema.    Medications and allergies   Allergies  Allergen Reactions   Penicillins Hives and Rash    REACTION: Rash  Medication list after today's encounter   Current Outpatient Medications:    Ascorbic Acid (VITAMIN C PO), Take 1,000 mg by mouth daily at 6 (six) AM., Disp: , Rfl:    diltiazem (CARDIZEM CD) 240 MG 24 hr capsule, Take 1 capsule (240 mg total) by mouth daily., Disp: 90 capsule, Rfl: 3   rosuvastatin (CRESTOR) 20 MG tablet, Take 1 tablet (20 mg total) by mouth daily. Must keep scheduled appt for future refills, Disp: 90 tablet, Rfl: 1   olmesartan (BENICAR) 40 MG tablet, Take 1 tablet (40 mg total) by mouth daily., Disp: 90 tablet, Rfl: 3   omega-3 acid ethyl esters (LOVAZA) 1 g capsule, Take 2 capsules (2 g total) by mouth 2 (two) times daily., Disp: 360 capsule, Rfl: 3  Laboratory examination:   Recent Labs    10/22/21 2222 10/22/21 2228 10/27/21 1216 11/01/21 0130  NA 140 143 138 134*  K 3.5 3.4* 3.8 3.5  CL 109 107 100 101  CO2 22  --  23 21*  GLUCOSE 101* 101* 128* 131*  BUN 22* 23* 21* 23*   CREATININE 1.28* 1.30* 1.47* 1.34*  CALCIUM 9.5  --  10.7* 9.5  GFRNONAA >60  --  58* >60   CrCl cannot be calculated (Patient's most recent lab result is older than the maximum 21 days allowed.).     Latest Ref Rng & Units 11/01/2021    1:30 AM 10/27/2021   12:16 PM 10/22/2021   10:28 PM  CMP  Glucose 70 - 99 mg/dL 427  062  376   BUN 6 - 20 mg/dL 23  21  23    Creatinine 0.61 - 1.24 mg/dL  2.83  1.51   Sodium 135 - 145 mmol/L 134  138  143   Potassium 3.5 - 5.1 mmol/L 3.5  3.8  3.4   Chloride 98 - 111 mmol/L 101  100  107   CO2 22 - 32 mmol/L 21  23    Calcium 8.9 - 10.3 mg/dL 9.5  7.61        Latest Ref Rng & Units 11/01/2021    1:30 AM 10/27/2021   12:16 PM 10/22/2021   10:28 PM  CBC  WBC 4.0 - 10.5 K/uL 12.3  8.5    Hemoglobin 13.0 - 17.0 g/dL 12/22/2021  37.1  06.2   Hematocrit 39.0 - 52.0 % 50.1  53.5  47.0   Platelets 150 - 400 K/uL 199  185     Lipid Panel Recent Labs    04/01/21 1151  CHOL 214*  TRIG 413.0 Triglyceride is over 400; calculations on Lipids are invalid.*  HDL 33.10*  CHOLHDL 6  LDLDIRECT 84.0    Addendum Labs 02/09/2022 with therapy TG are now normal   HEMOGLOBIN A1C Lab Results  Component Value Date   HGBA1C 5.2 04/01/2021   TSH Recent Labs    04/01/21 1151  TSH 0.55   Radiology:   Chest x-ray two-view 11/01/2021: The heart size and mediastinal contours are within normal limits. Both lungs are clear. The visualized skeletal structures are unremarkable.  CT angiogram of head and neck 10/22/2021:  No large vessel occlusion, chronic microvascular changes consistent with hypertension, aortic atherosclerosis.  Cardiac Studies:   Ambulatory cardiac telemetry 7 days (11/13/21 - 11/19/2021): Predominant underlying rhythm was sinus with rare PACs and PVCs.  Patient triggered events correlated with normal sinus rhythm.  Occasional PVC. No evidence of significant cardiac arrhythmias including SVT, A-fib, high degree AV block, pauses >3  seconds, or  ventricular tachycardia.  PCV ECHOCARDIOGRAM COMPLETE 11/19/2021  Normal LV systolic function with visual EF 60-65%. Left ventricle cavity is normal in size. Mild left ventricular hypertrophy. Normal global wall motion. Normal diastolic filling pattern, normal LAP. Calculated EF 65%. No significant valvular heart disease. No prior study for comparison.  Coronary calcium score 12/15/2021: LM 0 LAD 42.6 LCx 43.2 RCA 43.2 Total Agatston score is 129, MESA database percentile 96. Ascending and descending aorta measured normal. Small solitary pulmonary nodule of the left lower lobe measuring 4 mm, no follow-up needed if low risk.  No other significant extracardiac abnormality.  PCV MYOCARDIAL PERFUSION WO LEXISCAN 01/13/2022  Narrative Exercise nuclear stress test 01/13/2022: Normal ECG stress. The patient exercised for 9 minutes and 25 seconds of a Bruce protocol, achieving approximately 10.85 METs and 87% of MPHR. No symptoms. The blood pressure response was normal. Negative for ischemia or inducible arrhythmias. Myocardial perfusion is normal. Overall LV systolic function is normal without regional wall motion abnormalities. Calculated Stress LV EF: 45% but visually normal. No previous exam available for comparison. Low risk.     EKG:   EKG 11/13/2021: Normal sinus rhythm at rate of 64 bpm, incomplete right bundle branch block.  Otherwise normal EKG.    Assessment     ICD-10-CM   1. Primary hypertension  I10 olmesartan (BENICAR) 40 MG tablet    2. Elevated coronary artery calcium score 12/15/2021: Total Agatston score is 129, MESA database percentile 96.  R93.1 omega-3 acid ethyl esters (LOVAZA) 1 g capsule    3. Familial hypertriglyceridemia  E78.1 omega-3 acid ethyl esters (LOVAZA) 1 g capsule    4. Rapid palpitations  R00.2     5. Hyperlipidemia with target LDL less than 160  E78.5       Medications Discontinued During This Encounter  Medication Reason   ALPRAZolam (XANAX)  0.25 MG tablet    olmesartan (BENICAR) 40 MG tablet Reorder   omega-3 acid ethyl esters (LOVAZA) 1 g capsule Reorder     Meds ordered this encounter  Medications   olmesartan (BENICAR) 40 MG tablet    Sig: Take 1 tablet (40 mg total) by mouth daily.    Dispense:  90 tablet    Refill:  3   omega-3 acid ethyl esters (LOVAZA) 1 g capsule    Sig: Take 2 capsules (2 g total) by mouth 2 (two) times daily.    Dispense:  360 capsule    Refill:  3   No orders of the defined types were placed in this encounter.  Recommendations:   Ricky Gallery is a 51 y.o.  African-American male patient with hypertension, chronic palpitations, and hypertension.  He was also evaluated for precordial pain, coronary calcium score was markedly elevated placing him in 96 percentile in May 2023.  He also has familial hypertriglyceridemia.  Fortunately he has not had any major chest pain episodes, he feels chest pain when he feels palpitations.  Symptoms are improved since being on diltiazem.  Blood pressure is now well controlled as well.  He has made lifestyle changes with regard to his diet, given up most of the carbohydrates and also red meats.  He is now tolerating Lovaza 4 g/day and also Crestor.  We need to continue aggressive primary prevention in view of high coronary calcium score.  This was discussed with the patient.  His exercise stress test revealed excellent exercise tolerance.  With regard to his pulsatile feeling in his right ear, patient previously has  had complete neurologic work-up for questionable TIA and at that time CT angiogram of the head and neck did not reveal any aneurysm formation.  Suspect hypertension was the etiology for this and it should hopefully improve over time as blood pressure is now well controlled.  Medications refilled, I will see him back in a year or sooner if problems.     Addendum: Labs 02/13/22      Yates Decamp, MD, [facility] 02/04/2022, 3:15 PM Office:  [phone]

## 2022-02-13 LAB — LIPID PANEL WITH LDL/HDL RATIO
Cholesterol, Total: 149 mg/dL (ref 100–199)
HDL: 36 mg/dL — ABNORMAL LOW (ref >39)
LDL Chol Calc (NIH): 92 mg/dL (ref 0–99)
LDL/HDL Ratio: 2.6 ratio (ref 0.0–3.6)
Triglycerides: 112 mg/dL (ref 0–149)
VLDL Cholesterol Cal: 21 mg/dL (ref 5–40)

## 2022-02-13 LAB — LDL CHOLESTEROL, DIRECT: LDL Direct: 91 mg/dL (ref 0–99)

## 2022-02-13 LAB — BASIC METABOLIC PANEL
BUN/Creatinine Ratio: 17 (ref 9–20)
BUN: 22 mg/dL (ref 6–24)
CO2: 22 mmol/L (ref 20–29)
Calcium: 9.8 mg/dL (ref 8.7–10.2)
Chloride: 104 mmol/L (ref 96–106)
Creatinine, Ser: 1.26 mg/dL (ref 0.76–1.27)
Glucose: 100 mg/dL — ABNORMAL HIGH (ref 70–99)
Potassium: 4.3 mmol/L (ref 3.5–5.2)
Sodium: 142 mmol/L (ref 134–144)
eGFR: 69 mL/min/{1.73_m2} (ref >59)

## 2022-03-02 ENCOUNTER — Other Ambulatory Visit: Payer: Self-pay | Admitting: Cardiology

## 2022-03-02 DIAGNOSIS — I10 Essential (primary) hypertension: Secondary | ICD-10-CM

## 2022-03-06 ENCOUNTER — Other Ambulatory Visit: Payer: Self-pay | Admitting: Internal Medicine

## 2022-03-06 DIAGNOSIS — E785 Hyperlipidemia, unspecified: Secondary | ICD-10-CM

## 2022-04-23 ENCOUNTER — Telehealth: Payer: Self-pay

## 2022-04-23 ENCOUNTER — Other Ambulatory Visit: Payer: Self-pay

## 2022-04-23 DIAGNOSIS — I10 Essential (primary) hypertension: Secondary | ICD-10-CM

## 2022-04-23 MED ORDER — NEBIVOLOL HCL 10 MG PO TABS: 10.0000 mg | ORAL_TABLET | Freq: Every day | ORAL | 2 refills | Status: DC

## 2022-04-23 NOTE — Telephone Encounter (Signed)
Called pt to inform him about the message above. Pt understood

## 2022-04-23 NOTE — Telephone Encounter (Signed)
ICD-10-CM   1. Primary hypertension  I10 nebivolol (BYSTOLIC) 10 MG tablet      Current Outpatient Medications:    nebivolol (BYSTOLIC) 10 MG tablet, Take 1 tablet (10 mg total) by mouth daily. Take ont tab extra if SBP > 140 mm Hg, Disp: 30 tablet, Rfl: 2   Ascorbic Acid (VITAMIN C PO), Take 1,000 mg by mouth daily at 6 (six) AM., Disp: , Rfl:    diltiazem (CARDIZEM CD) 240 MG 24 hr capsule, Take 1 capsule (240 mg total) by mouth daily., Disp: 90 capsule, Rfl: 3   olmesartan (BENICAR) 40 MG tablet, TAKE 1 TABLET(40 MG) BY MOUTH DAILY, Disp: 30 tablet, Rfl: 3   omega-3 acid ethyl esters (LOVAZA) 1 g capsule, Take 2 capsules (2 g total) by mouth 2 (two) times daily., Disp: 360 capsule, Rfl: 3   rosuvastatin (CRESTOR) 20 MG tablet, TAKE 1 TABLET(20 MG) BY MOUTH DAILY, Disp: 90 tablet, Rfl: 0  Meds ordered this encounter  Medications   nebivolol (BYSTOLIC) 10 MG tablet    Sig: Take 1 tablet (10 mg total) by mouth daily. Take ont tab extra if SBP > 140 mm Hg    Dispense:  30 tablet    Refill:  2

## 2022-04-23 NOTE — Telephone Encounter (Signed)
Patient called to inform us that his bp has been high for the past few days. Patient mention that he has been feeling fatigue, with headaches and cp off and on. Pt checked his bp this morning and it was 160/98. Pt mention he he takes his bp at night the olmesartan and the Diltiazem.

## 2022-04-23 NOTE — Telephone Encounter (Signed)
I sent in Bystolic 10 mg daily. Take two today and avoid snacks, salt and excess caffeine

## 2022-04-26 ENCOUNTER — Encounter: Payer: Self-pay | Admitting: Cardiology

## 2022-04-26 NOTE — Telephone Encounter (Signed)
From Patient

## 2022-04-27 ENCOUNTER — Ambulatory Visit: Payer: 59 | Admitting: Internal Medicine

## 2022-04-27 VITALS — BP 126/80 | HR 70 | Ht 67.0 in | Wt 201.0 lb

## 2022-04-27 DIAGNOSIS — I10 Essential (primary) hypertension: Secondary | ICD-10-CM | POA: Diagnosis not present

## 2022-04-27 DIAGNOSIS — R5383 Other fatigue: Secondary | ICD-10-CM | POA: Insufficient documentation

## 2022-04-27 DIAGNOSIS — R0789 Other chest pain: Secondary | ICD-10-CM | POA: Insufficient documentation

## 2022-04-27 LAB — D-DIMER, QUANTITATIVE: D-Dimer, Quant: 0.6 mcg/mL FEU — ABNORMAL HIGH (ref <0.50)

## 2022-04-27 LAB — TROPONIN I: Troponin I: 12 ng/L (ref <=47)

## 2022-04-27 NOTE — Progress Notes (Signed)
Subjective:  Patient ID: Wayne Morales, male    DOB: 1970-12-08  Age: 52 y.o. MRN: [id number]  CC: Hypertension   HPI Wayne Morales presents for f/up -  He complains of a 2-week history of diffuse weakness and fatigue and now a 1 week history of chest pain and diaphoresis.  He denies cough, hemoptysis, dizziness, lightheadedness, or edema.  Outpatient Medications Prior to Visit  Medication Sig Dispense Refill   Ascorbic Acid (VITAMIN C PO) Take 1,000 mg by mouth daily at 6 (six) AM.     diltiazem (CARDIZEM CD) 240 MG 24 hr capsule Take 1 capsule (240 mg total) by mouth daily. 90 capsule 3   nebivolol (BYSTOLIC) 10 MG tablet Take 1 tablet (10 mg total) by mouth daily. Take ont tab extra if SBP > 140 mm Hg 30 tablet 2   olmesartan (BENICAR) 40 MG tablet TAKE 1 TABLET(40 MG) BY MOUTH DAILY 30 tablet 3   omega-3 acid ethyl esters (LOVAZA) 1 g capsule Take 2 capsules (2 g total) by mouth 2 (two) times daily. 360 capsule 3   rosuvastatin (CRESTOR) 20 MG tablet TAKE 1 TABLET(20 MG) BY MOUTH DAILY 90 tablet 0   No facility-administered medications prior to visit.    ROS Review of Systems  Constitutional:  Negative for chills, diaphoresis, fatigue and fever.  HENT: Negative.    Eyes: Negative.   Respiratory:  Positive for shortness of breath. Negative for cough, chest tightness and wheezing.   Cardiovascular:  Positive for chest pain. Negative for palpitations and leg swelling.  Gastrointestinal:  Negative for abdominal pain, constipation, diarrhea, nausea and vomiting.  Endocrine: Negative.   Genitourinary: Negative.  Negative for difficulty urinating.  Musculoskeletal: Negative.   Skin: Negative.   Neurological:  Negative for dizziness, weakness and light-headedness.  Hematological:  Negative for adenopathy. Does not bruise/bleed easily.  Psychiatric/Behavioral:  The patient is nervous/anxious.     Objective:  BP 126/80 (BP Location: Left Arm, Patient Position: Sitting, Cuff  Size: Large)   Pulse 70   Ht 5\' 7"  (1.702 m)   Wt 201 lb (91.2 kg)   SpO2 95%   BMI 31.48 kg/m   BP Readings from Last 3 Encounters:  04/27/22 126/80  02/04/22 123/75  12/25/21 (!) 133/91    Wt Readings from Last 3 Encounters:  04/27/22 201 lb (91.2 kg)  02/04/22 199 lb 3.2 oz (90.4 kg)  12/25/21 201 lb 3.2 oz (91.3 kg)    Physical Exam Vitals reviewed.  HENT:     Mouth/Throat:     Mouth: Mucous membranes are moist.  Eyes:     General: No scleral icterus.    Conjunctiva/sclera: Conjunctivae normal.  Cardiovascular:     Rate and Rhythm: Normal rate and regular rhythm.     Heart sounds: Normal heart sounds, S1 normal and S2 normal. Heart sounds not distant. No murmur heard.    No friction rub. No gallop.     Comments: EKG- NSR, 64 bpm RBBB, Flat T waves in V1V2 are not new No LVH or Q waves Pulmonary:     Effort: Pulmonary effort is normal.     Breath sounds: No stridor. No wheezing, rhonchi or rales.  Abdominal:     Palpations: There is no mass.     Tenderness: There is no abdominal tenderness. There is no guarding.     Hernia: No hernia is present.  Musculoskeletal:     Cervical back: Neck supple.     Right lower leg: No edema.  Left lower leg: No edema.  Lymphadenopathy:     Cervical: No cervical adenopathy.  Skin:    General: Skin is warm and dry.  Neurological:     General: No focal deficit present.     Mental Status: He is alert.  Psychiatric:        Attention and Perception: Attention and perception normal.        Mood and Affect: Affect normal. Mood is anxious.        Speech: Speech normal.        Behavior: Behavior normal.        Cognition and Memory: Cognition normal.     Lab Results  Component Value Date   WBC 9.8 04/27/2022   HGB 16.3 04/27/2022   HCT 45.8 04/27/2022   PLT 194 04/27/2022   GLUCOSE 86 04/27/2022   CHOL 149 02/12/2022   TRIG 112 02/12/2022   HDL 36 (L) 02/12/2022   LDLDIRECT 91 02/12/2022   LDLCALC 92 02/12/2022    ALT 36 10/22/2021   AST 30 10/22/2021   NA 140 04/27/2022   K 4.2 04/27/2022   CL 103 04/27/2022   CREATININE 1.36 (H) 04/27/2022   BUN 22 04/27/2022   CO2 25 04/27/2022   TSH 0.62 04/27/2022   PSA 0.4 09/25/2019   INR 0.9 10/22/2021   HGBA1C 5.2 04/01/2021    CT CARDIAC SCORING ([facility] LOCATIONS ONLY)  Result Date: 12/15/2021 CLINICAL DATA:  51 year old black male EXAM: CT CARDIAC CORONARY ARTERY CALCIUM SCORE TECHNIQUE: Non-contrast imaging through the heart was performed using prospective ECG gating. Image post processing was performed on an independent workstation, allowing for quantitative analysis of the heart and coronary arteries. Note that this exam targets the heart and the chest was not imaged in its entirety. COMPARISON:  None Available. FINDINGS: CORONARY CALCIUM SCORES: Left Main: 0 LAD: 42.6 LCx: 43.2 RCA: 43.2 Total Agatston Score: 129 MESA database percentile: 96 AORTA MEASUREMENTS: Ascending Aorta: 28 mm Descending Aorta: 24 mm OTHER FINDINGS: Vascular: Normal heart size. No pericardial effusion. Normal caliber thoracic aorta with no significant atherosclerotic disease. Mediastinum/Nodes: Esophagus is unremarkable. No pathologically enlarged lymph nodes seen in the chest. Lungs/Pleura: Central airways are patent. No consolidation, pleural effusion or pneumothorax. Small solid pulmonary nodule of the left lower lobe measuring 4 mm on series 3, image 48. Upper Abdomen: No acute abnormality. Musculoskeletal: No chest wall mass or suspicious bone lesions identified. IMPRESSION: 1. Total calcium score of 129 with is at percentile 96 based for subjects of the same age, gender, and race/ethnicity. 2. Small solid pulmonary nodule of the left lower lobe measuring 4 mm. No follow-up needed if patient is low-risk.This recommendation follows the consensus statement: Guidelines for Management of Incidental Pulmonary Nodules Detected on CT Images: From the Fleischner Society 2017; Radiology 2017;  [id number]. Electronically Signed   By: Allegra Lai M.D.   On: 12/15/2021 17:06    Assessment & Plan:   Wayne Morales was seen today for hypertension.  Diagnoses and all orders for this visit:  Chest discomfort -     EKG 12-Lead  Chest pressure- Exam, labs, EKG are all normal.  This is likely caused by anxiety. -     Cancel: Troponin I (High Sensitivity); Future -     Cancel: D-dimer, quantitative; Future -     Cancel: CBC with Differential/Platelet; Future -     Cancel: Brain natriuretic peptide; Future -     CBC with Differential/Platelet; Future -     Basic metabolic panel;  Future -     Troponin I (High Sensitivity); Future -     Brain natriuretic peptide; Future -     D-dimer, quantitative; Future -     D-dimer, quantitative -     Brain natriuretic peptide -     Troponin I (High Sensitivity) -     Basic metabolic panel -     CBC with Differential/Platelet -     Troponin I; Future -     Troponin I  Other fatigue- The work-up is negative for secondary causes. -     Cancel: Basic metabolic panel; Future -     TSH; Future -     Cancel: CBC with Differential/Platelet; Future -     CBC with Differential/Platelet; Future -     Basic metabolic panel; Future -     Basic metabolic panel -     CBC with Differential/Platelet -     TSH -     Troponin I; Future -     Troponin I   I am having Wayne Morales maintain his Ascorbic Acid (VITAMIN C PO), diltiazem, omega-3 acid ethyl esters, olmesartan, rosuvastatin, and nebivolol.  No orders of the defined types were placed in this encounter.    Follow-up: No follow-ups on file.  Sanda Linger, MD

## 2022-04-28 LAB — CBC WITH DIFFERENTIAL/PLATELET
Absolute Monocytes: 745 cells/uL (ref 200–950)
Basophils Absolute: 39 cells/uL (ref 0–200)
Basophils Relative: 0.4 %
Eosinophils Absolute: 176 cells/uL (ref 15–500)
Eosinophils Relative: 1.8 %
HCT: 45.8 % (ref 38.5–50.0)
Hemoglobin: 16.3 g/dL (ref 13.2–17.1)
Lymphs Abs: 3185 cells/uL (ref 850–3900)
MCH: 33.1 pg — ABNORMAL HIGH (ref 27.0–33.0)
MCHC: 35.6 g/dL (ref 32.0–36.0)
MCV: 93.1 fL (ref 80.0–100.0)
MPV: 12.2 fL (ref 7.5–12.5)
Monocytes Relative: 7.6 %
Neutro Abs: 5655 cells/uL (ref 1500–7800)
Neutrophils Relative %: 57.7 %
Platelets: 194 10*3/uL (ref 140–400)
RBC: 4.92 10*6/uL (ref 4.20–5.80)
RDW: 12.2 % (ref 11.0–15.0)
Total Lymphocyte: 32.5 %
WBC: 9.8 10*3/uL (ref 3.8–10.8)

## 2022-04-28 LAB — BASIC METABOLIC PANEL
BUN/Creatinine Ratio: 16 (calc) (ref 6–22)
BUN: 22 mg/dL (ref 7–25)
CO2: 25 mmol/L (ref 20–32)
Calcium: 10.2 mg/dL (ref 8.6–10.3)
Chloride: 103 mmol/L (ref 98–110)
Creat: 1.36 mg/dL — ABNORMAL HIGH (ref 0.70–1.30)
Glucose, Bld: 86 mg/dL (ref 65–99)
Potassium: 4.2 mmol/L (ref 3.5–5.3)
Sodium: 140 mmol/L (ref 135–146)

## 2022-04-28 LAB — BRAIN NATRIURETIC PEPTIDE: Brain Natriuretic Peptide: 7 pg/mL (ref <100)

## 2022-04-28 LAB — TSH: TSH: 0.62 u[IU]/mL (ref 0.35–5.50)

## 2022-05-01 ENCOUNTER — Encounter: Payer: Self-pay | Admitting: Internal Medicine

## 2022-05-01 NOTE — Patient Instructions (Signed)
Hypertension, Adult High blood pressure (hypertension) is when the force of blood pumping through the arteries is too strong. The arteries are the blood vessels that carry blood from the heart throughout the body. Hypertension forces the heart to work harder to pump blood and may cause arteries to become narrow or stiff. Untreated or uncontrolled hypertension can lead to a heart attack, heart failure, a stroke, kidney disease, and other problems. A blood pressure reading consists of a higher number over a lower number. Ideally, your blood pressure should be below 120/80. The first ("top") number is called the systolic pressure. It is a measure of the pressure in your arteries as your heart beats. The second ("bottom") number is called the diastolic pressure. It is a measure of the pressure in your arteries as the heart relaxes. What are the causes? The exact cause of this condition is not known. There are some conditions that result in high blood pressure. What increases the risk? Certain factors may make you more likely to develop high blood pressure. Some of these risk factors are under your control, including: Smoking. Not getting enough exercise or physical activity. Being overweight. Having too much fat, sugar, calories, or salt (sodium) in your diet. Drinking too much alcohol. Other risk factors include: Having a personal history of heart disease, diabetes, high cholesterol, or kidney disease. Stress. Having a family history of high blood pressure and high cholesterol. Having obstructive sleep apnea. Age. The risk increases with age. What are the signs or symptoms? High blood pressure may not cause symptoms. Very high blood pressure (hypertensive crisis) may cause: Headache. Fast or irregular heartbeats (palpitations). Shortness of breath. Nosebleed. Nausea and vomiting. Vision changes. Severe chest pain, dizziness, and seizures. How is this diagnosed? This condition is diagnosed by  measuring your blood pressure while you are seated, with your arm resting on a flat surface, your legs uncrossed, and your feet flat on the floor. The cuff of the blood pressure monitor will be placed directly against the skin of your upper arm at the level of your heart. Blood pressure should be measured at least twice using the same arm. Certain conditions can cause a difference in blood pressure between your right and left arms. If you have a high blood pressure reading during one visit or you have normal blood pressure with other risk factors, you may be asked to: Return on a different day to have your blood pressure checked again. Monitor your blood pressure at home for 1 week or longer. If you are diagnosed with hypertension, you may have other blood or imaging tests to help your health care provider understand your overall risk for other conditions. How is this treated? This condition is treated by making healthy lifestyle changes, such as eating healthy foods, exercising more, and reducing your alcohol intake. You may be referred for counseling on a healthy diet and physical activity. Your health care provider may prescribe medicine if lifestyle changes are not enough to get your blood pressure under control and if: Your systolic blood pressure is above 130. Your diastolic blood pressure is above 80. Your personal target blood pressure may vary depending on your medical conditions, your age, and other factors. Follow these instructions at home: Eating and drinking  Eat a diet that is high in fiber and potassium, and low in sodium, added sugar, and fat. An example of this eating plan is called the DASH diet. DASH stands for Dietary Approaches to Stop Hypertension. To eat this way: Eat   plenty of fresh fruits and vegetables. Try to fill one half of your plate at each meal with fruits and vegetables. Eat whole grains, such as whole-wheat pasta, brown rice, or whole-grain bread. Fill about one  fourth of your plate with whole grains. Eat or drink low-fat dairy products, such as skim milk or low-fat yogurt. Avoid fatty cuts of meat, processed or cured meats, and poultry with skin. Fill about one fourth of your plate with lean proteins, such as fish, chicken without skin, beans, eggs, or tofu. Avoid pre-made and processed foods. These tend to be higher in sodium, added sugar, and fat. Reduce your daily sodium intake. Many people with hypertension should eat less than 1,500 mg of sodium a day. Do not drink alcohol if: Your health care provider tells you not to drink. You are pregnant, may be pregnant, or are planning to become pregnant. If you drink alcohol: Limit how much you have to: 0-1 drink a day for women. 0-2 drinks a day for men. Know how much alcohol is in your drink. In the [place] one drink equals one 12 oz bottle of beer (355 mL), one 5 oz glass of wine (148 mL), or one 1 oz glass of hard liquor (44 mL). Lifestyle  Work with your health care provider to maintain a healthy body weight or to lose weight. Ask what an ideal weight is for you. Get at least 30 minutes of exercise that causes your heart to beat faster (aerobic exercise) most days of the week. Activities may include walking, swimming, or biking. Include exercise to strengthen your muscles (resistance exercise), such as Pilates or lifting weights, as part of your weekly exercise routine. Try to do these types of exercises for 30 minutes at least 3 days a week. Do not use any products that contain nicotine or tobacco. These products include cigarettes, chewing tobacco, and vaping devices, such as e-cigarettes. If you need help quitting, ask your health care provider. Monitor your blood pressure at home as told by your health care provider. Keep all follow-up visits. This is important. Medicines Take over-the-counter and prescription medicines only as told by your health care provider. Follow directions carefully. Blood  pressure medicines must be taken as prescribed. Do not skip doses of blood pressure medicine. Doing this puts you at risk for problems and can make the medicine less effective. Ask your health care provider about side effects or reactions to medicines that you should watch for. Contact a health care provider if you: Think you are having a reaction to a medicine you are taking. Have headaches that keep coming back (recurring). Feel dizzy. Have swelling in your ankles. Have trouble with your vision. Get help right away if you: Develop a severe headache or confusion. Have unusual weakness or numbness. Feel faint. Have severe pain in your chest or abdomen. Vomit repeatedly. Have trouble breathing. These symptoms may be an emergency. Get help right away. Call 911. Do not wait to see if the symptoms will go away. Do not drive yourself to the hospital. Summary Hypertension is when the force of blood pumping through your arteries is too strong. If this condition is not controlled, it may put you at risk for serious complications. Your personal target blood pressure may vary depending on your medical conditions, your age, and other factors. For most people, a normal blood pressure is less than 120/80. Hypertension is treated with lifestyle changes, medicines, or a combination of both. Lifestyle changes include losing weight, eating a healthy,   low-sodium diet, exercising more, and limiting alcohol. This information is not intended to replace advice given to you by your health care provider. Make sure you discuss any questions you have with your health care provider. Document Revised: 05/12/2021 Document Reviewed: 05/12/2021 Elsevier Patient Education  2023 Elsevier Inc.  

## 2022-05-04 ENCOUNTER — Other Ambulatory Visit: Payer: Self-pay | Admitting: Internal Medicine

## 2022-05-14 ENCOUNTER — Telehealth: Payer: 59 | Admitting: Physician Assistant

## 2022-05-14 DIAGNOSIS — F419 Anxiety disorder, unspecified: Secondary | ICD-10-CM

## 2022-05-14 MED ORDER — HYDROXYZINE PAMOATE 25 MG PO CAPS: 25.0000 mg | ORAL_CAPSULE | Freq: Three times a day (TID) | ORAL | 0 refills | Status: DC | PRN

## 2022-05-14 NOTE — Progress Notes (Signed)
Virtual Visit Consent   Wayne Morales, you are scheduled for a virtual visit with a Wayne Morales provider today. Just as with appointments in the office, your consent must be obtained to participate. Your consent will be active for this visit and any virtual visit you may have with one of our providers in the next 365 days. If you have a MyChart account, a copy of this consent can be sent to you electronically.  As this is a virtual visit, video technology does not allow for your provider to perform a traditional examination. This may limit your provider's ability to fully assess your condition. If your provider identifies any concerns that need to be evaluated in person or the need to arrange testing (such as labs, EKG, etc.), we will make arrangements to do so. Although advances in technology are sophisticated, we cannot ensure that it will always work on either your end or our end. If the connection with a video visit is poor, the visit may have to be switched to a telephone visit. With either a video or telephone visit, we are not always able to ensure that we have a secure connection.  By engaging in this virtual visit, you consent to the provision of healthcare and authorize for your insurance to be billed (if applicable) for the services provided during this visit. Depending on your insurance coverage, you may receive a charge related to this service.  I need to obtain your verbal consent now. Are you willing to proceed with your visit today? Wayne Morales has provided verbal consent on 05/14/2022 for a virtual visit (video or telephone). Wayne Morales, [place]  Date: 05/14/2022 2:41 PM  Virtual Visit via Video Note   I, Wayne Morales, connected with  Wayne Morales  ([id number], 07/10/1971) on 05/14/22 at  2:15 PM EDT by a video-enabled telemedicine application and verified that I am speaking with the correct person using two identifiers.  Location: Patient: Virtual Visit Location  Patient: Home Provider: Virtual Visit Location Provider: Home   I discussed the limitations of evaluation and management by telemedicine and the availability of in person appointments. The patient expressed understanding and agreed to proceed.    History of Present Illness: Wayne Morales is a 51 y.o. who identifies as a male who was assigned male at birth, and is being seen today for anxiety.  Pt states he has been having anxiety for the last several months. He states that the medication he takes gives him heartburn which makes him feel concerned for a heart problem and increases his anxiety levels. 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 He states these symptoms have been increasing over the last few months and now he is experiencing them daily. He has been thoroughly evaluated by his PCP and cardiologist for his symptoms and his symptoms were determined to not be of cardiac or pulmonary etiology. He just started therapy and has been taking alprazolam however he is concerned to keep taking it because he knows that it can be habit forming. He drinks minimal caffeine, rarely drinks etoh, and does not smoke. Reports minimal exercise.   He was experiencing symptoms again all morning to wanted to make an appt.   He states that at the time of the video visit his symptoms are improving and have largely resolved.   HPI: HPI  Problems:  Patient Active Problem List   Diagnosis Date Noted   Chest pressure 04/27/2022  Other fatigue 04/27/2022   Drug-induced erectile dysfunction 06/02/2021   Encounter for general adult medical examination with abnormal findings 04/01/2021   Vitamin D deficiency disease 04/01/2021   Colon cancer screening 04/01/2021   Pure hyperglyceridemia 09/09/2017   Routine general medical examination at a health care facility 09/07/2017   Onychomycosis of great toe 09/07/2017   Protrusion of lumbar intervertebral disc     Essential hypertension 07/08/2014   Scalp psoriasis 12/15/2012   Hyperlipidemia with target LDL less than 160 07/08/2008    Allergies:  Allergies  Allergen Reactions   Penicillins Hives and Rash    REACTION: Rash   Medications:  Current Outpatient Medications:    hydrOXYzine (VISTARIL) 25 MG capsule, Take 1 capsule (25 mg total) by mouth every 8 (eight) hours as needed., Disp: 30 capsule, Rfl: 0   Ascorbic Acid (VITAMIN C PO), Take 1,000 mg by mouth daily at 6 (six) AM., Disp: , Rfl:    diltiazem (CARDIZEM CD) 240 MG 24 hr capsule, Take 1 capsule (240 mg total) by mouth daily., Disp: 90 capsule, Rfl: 3   nebivolol (BYSTOLIC) 10 MG tablet, Take 1 tablet (10 mg total) by mouth daily. Take ont tab extra if SBP > 140 mm Hg, Disp: 30 tablet, Rfl: 2   olmesartan (BENICAR) 40 MG tablet, TAKE 1 TABLET(40 MG) BY MOUTH DAILY, Disp: 30 tablet, Rfl: 3   omega-3 acid ethyl esters (LOVAZA) 1 g capsule, Take 2 capsules (2 g total) by mouth 2 (two) times daily., Disp: 360 capsule, Rfl: 3   rosuvastatin (CRESTOR) 20 MG tablet, TAKE 1 TABLET(20 MG) BY MOUTH DAILY, Disp: 90 tablet, Rfl: 0  Observations/Objective: Patient is well-developed, well-nourished in no acute distress.  Resting comfortably  at home.  Head is normocephalic, atraumatic.  No labored breathing.  Speech is clear and coherent with logical content.  Patient is alert and oriented at baseline.    Assessment and Plan: 1. Anxiety  Pt given rx for hydroxyzine. He was advised on lifestyle measures to help with his anxiety and was advised to reach out to his therapist for an earlier appointment. He was also advised to f/u with his pcp to discuss his worsening symptoms and other possible medication options.   Follow Up Instructions: I discussed the assessment and treatment plan with the patient. The patient was provided an opportunity to ask questions and all were answered. The patient agreed with the plan and demonstrated an  understanding of the instructions.  A copy of instructions were sent to the patient via MyChart unless otherwise noted below.   The patient was advised to call back or seek an in-person evaluation if the symptoms worsen or if the condition fails to improve as anticipated.  Time:  I spent 30 minutes with the patient via telehealth technology discussing the above problems/concerns.    Lory Galan Manfred Shirts, PA-C

## 2022-05-14 NOTE — Patient Instructions (Signed)
[name], thank you for joining [name], PA-C for today's virtual visit.  While this provider is not your primary care provider (PCP), if your PCP is located in our provider database this encounter information will be shared with them immediately following your visit.   [facility] account gives you access to today's visit and all your visits, tests, and labs performed at [facility] " click here if you don't have a [facility] account or go to [email or web address]  Consent: (Patient) Wayne Morales provided verbal consent for this virtual visit at the beginning of the encounter.  Current Medications:  Current Outpatient Medications:    hydrOXYzine (VISTARIL) 25 MG capsule, Take 1 capsule (25 mg total) by mouth every 8 (eight) hours as needed., Disp: 30 capsule, Rfl: 0   Ascorbic Acid (VITAMIN C PO), Take 1,000 mg by mouth daily at 6 (six) AM., Disp: , Rfl:    diltiazem (CARDIZEM CD) 240 MG 24 hr capsule, Take 1 capsule (240 mg total) by mouth daily., Disp: 90 capsule, Rfl: 3   nebivolol (BYSTOLIC) 10 MG tablet, Take 1 tablet (10 mg total) by mouth daily. Take ont tab extra if SBP > 140 mm Hg, Disp: 30 tablet, Rfl: 2   olmesartan (BENICAR) 40 MG tablet, TAKE 1 TABLET(40 MG) BY MOUTH DAILY, Disp: 30 tablet, Rfl: 3   omega-3 acid ethyl esters (LOVAZA) 1 g capsule, Take 2 capsules (2 g total) by mouth 2 (two) times daily., Disp: 360 capsule, Rfl: 3   rosuvastatin (CRESTOR) 20 MG tablet, TAKE 1 TABLET(20 MG) BY MOUTH DAILY, Disp: 90 tablet, Rfl: 0   Medications ordered in this encounter:  Meds ordered this encounter  Medications   hydrOXYzine (VISTARIL) 25 MG capsule    Sig: Take 1 capsule (25 mg total) by mouth every 8 (eight) hours as needed.    Dispense:  30 capsule    Refill:  0    Order Specific Question:   Supervising Provider    Answer:   Chase Picket [id number]     *If you need refills on other medications prior to your next  appointment, please contact your pharmacy*  Follow-Up: Call back or seek an in-person evaluation if the symptoms worsen or if the condition fails to improve as anticipated.  [facility] [phone]  Other Instructions Prescription given for Hydroxizine for anxiety. Start by taking 1/2 tablet every 8 hours as needed. If this dose does not improve symptoms you can increase the dose to a full tablet or take 2 tablets every 8 hours as needed. Take medication as directed and do not operate machinery, drive a car, or work while taking this medication as it can make you drowsy.   Follow up with your regular doctor in 1 week for reassessment and seek care sooner if your symptoms worsen or fail to improve.    If you have been instructed to have an in-person evaluation today at a local Urgent Care facility, please use the link below. It will take you to a list of all of our available [facility], including address, phone number and hours of operation. Please do not delay care.  [facility]  If you or a family member do not have a primary care provider, use the link below to schedule a visit and establish care. When you choose a [facility] primary care physician or advanced practice provider, you gain a long-term partner in health. Find a  Primary Care Provider  Learn more about [facility]'s in-office and virtual care options: [facility]

## 2022-05-17 ENCOUNTER — Other Ambulatory Visit: Payer: Self-pay | Admitting: Internal Medicine

## 2022-05-17 ENCOUNTER — Encounter: Payer: Self-pay | Admitting: Internal Medicine

## 2022-05-17 DIAGNOSIS — F411 Generalized anxiety disorder: Secondary | ICD-10-CM | POA: Insufficient documentation

## 2022-05-17 MED ORDER — HYDROXYZINE HCL 10 MG PO TABS: 10.0000 mg | ORAL_TABLET | Freq: Four times a day (QID) | ORAL | 1 refills | Status: DC | PRN

## 2022-05-19 ENCOUNTER — Encounter: Payer: Self-pay | Admitting: Internal Medicine

## 2022-05-19 ENCOUNTER — Ambulatory Visit: Payer: 59 | Admitting: Internal Medicine

## 2022-05-19 VITALS — BP 126/78 | HR 74 | Temp 97.7°F | Ht 67.0 in | Wt 198.0 lb

## 2022-05-19 DIAGNOSIS — F410 Panic disorder [episodic paroxysmal anxiety] without agoraphobia: Secondary | ICD-10-CM | POA: Insufficient documentation

## 2022-05-19 DIAGNOSIS — K219 Gastro-esophageal reflux disease without esophagitis: Secondary | ICD-10-CM | POA: Diagnosis not present

## 2022-05-19 DIAGNOSIS — I10 Essential (primary) hypertension: Secondary | ICD-10-CM

## 2022-05-19 MED ORDER — CITALOPRAM HYDROBROMIDE 20 MG PO TABS: 20.0000 mg | ORAL_TABLET | Freq: Every day | ORAL | 3 refills | Status: DC

## 2022-05-19 NOTE — Assessment & Plan Note (Signed)
BP Readings from Last 3 Encounters:  05/19/22 126/78  04/27/22 126/80  02/04/22 123/75   Stable, pt to continue medical treatment cardizem CD 256 qd, bystolic 10 qd, benicar 40 qd\

## 2022-05-19 NOTE — Assessment & Plan Note (Signed)
Mild to mod, for otc pepcid prn,  to f/u any worsening symptoms or concerns

## 2022-05-19 NOTE — Assessment & Plan Note (Signed)
Uncontrolled but improved with hydroxyzine prn, ok to continue counseling, also start celexa 20 mg qd to least avoid the panic element and frequent ED visits

## 2022-05-19 NOTE — Patient Instructions (Signed)
Ok to continue the hydroxyzine 10 mg as needed  Please take all new medication as prescribed - the celexa 20 mg per day  Ok to try OTC Pepcid for heartburn if needed  Please continue all other medications as before, and refills have been done if requested.  Please have the pharmacy call with any other refills you may need.  Please continue your efforts at being more active, low cholesterol diet, and weight control.  Please keep your appointments with your specialists as you may have planned  Please see Dr Ronnald Ramp in 3 months

## 2022-05-19 NOTE — Progress Notes (Signed)
Patient ID: Wayne Morales, male   DOB: Oct 17, 1970, 51 y.o.   MRN: [id number]        Chief Complaint: follow up anxiety with panic, htn, gerd       HPI:  Wayne Morales is a 51 y.o. male here with c/o worsening 2-3 mo anxiety and easy paniking over issues he knows are not that significant, has been to ED 6 times.  Was seen per VV 10/27, did start the hydroxyzine 10 mg tid prn yesterday and felt improved, but wondering if this is a long term tx.  Denies worsening depressive symptoms, suicidal ideation.  Pt denies chest pain, increased sob or doe, wheezing, orthopnea, PND, increased LE swelling, palpitations, dizziness or syncope.   Pt denies polydipsia, polyuria, or new focal neuro s/s.  Currently involved in counseling already.  Has had mild worsening reflux, but no abd pain, dysphagia, n/v, bowel change or blood.         Wt Readings from Last 3 Encounters:  05/19/22 198 lb (89.8 kg)  04/27/22 201 lb (91.2 kg)  02/04/22 199 lb 3.2 oz (90.4 kg)   BP Readings from Last 3 Encounters:  05/19/22 126/78  04/27/22 126/80  02/04/22 123/75         Past Medical History:  Diagnosis Date   Atrial fibrillation (HCC)    Chronic back pain    Hyperlipidemia    diet controlled   Hypertension    on meds   Scalp psoriasis 12/15/2012   Past Surgical History:  Procedure Laterality Date   INGUINAL HERNIA REPAIR Left    as a child    reports that he has never smoked. He has never used smokeless tobacco. He reports current alcohol use. He reports that he does not use drugs. family history includes Brain cancer in his brother; Colon cancer (age of onset: 69) in his father; Colon polyps (age of onset: 51) in his father; Diabetes in his father; Heart disease in his brother; Hyperlipidemia in his mother; Hypertension in his mother and sister; Sarcoidosis in his brother and brother; Supraventricular tachycardia in his mother. Allergies  Allergen Reactions   Penicillins Hives and Rash    REACTION: Rash    Current Outpatient Medications on File Prior to Visit  Medication Sig Dispense Refill   Ascorbic Acid (VITAMIN C PO) Take 1,000 mg by mouth daily at 6 (six) AM.     diltiazem (CARDIZEM CD) 240 MG 24 hr capsule Take 1 capsule (240 mg total) by mouth daily. 90 capsule 3   hydrOXYzine (ATARAX) 10 MG tablet Take 1 tablet (10 mg total) by mouth every 6 (six) hours as needed. 120 tablet 1   nebivolol (BYSTOLIC) 10 MG tablet Take 1 tablet (10 mg total) by mouth daily. Take ont tab extra if SBP > 140 mm Hg 30 tablet 2   olmesartan (BENICAR) 40 MG tablet TAKE 1 TABLET(40 MG) BY MOUTH DAILY 30 tablet 3   omega-3 acid ethyl esters (LOVAZA) 1 g capsule Take 2 capsules (2 g total) by mouth 2 (two) times daily. 360 capsule 3   rosuvastatin (CRESTOR) 20 MG tablet TAKE 1 TABLET(20 MG) BY MOUTH DAILY 90 tablet 0   No current facility-administered medications on file prior to visit.        ROS:  All others reviewed and negative.  Objective        PE:  BP 126/78 (BP Location: Right Arm, Patient Position: Sitting, Cuff Size: Large)   Pulse 74   Temp 97.7 F (  36.5 C) (Oral)   Ht 5\' 7"  (1.702 m)   Wt 198 lb (89.8 kg)   SpO2 96%   BMI 31.01 kg/m                 Constitutional: Pt appears in NAD               HENT: Head: NCAT.                Right Ear: External ear normal.                 Left Ear: External ear normal.                Eyes: . Pupils are equal, round, and reactive to light. Conjunctivae and EOM are normal               Nose: without d/c or deformity               Neck: Neck supple. Gross normal ROM               Cardiovascular: Normal rate and regular rhythm.                 Pulmonary/Chest: Effort normal and breath sounds without rales or wheezing.                Abd:  Soft, NT, ND, + BS, no organomegaly               Neurological: Pt is alert. At baseline orientation, motor grossly intact               Skin: Skin is warm. No rashes, no other new lesions, LE edema - none                Psychiatric: Pt behavior is normal without agitation, mod nervous  Micro: none  Cardiac tracings I have personally interpreted today:  none  Pertinent Radiological findings (summarize): none   Lab Results  Component Value Date   WBC 9.8 04/27/2022   HGB 16.3 04/27/2022   HCT 45.8 04/27/2022   PLT 194 04/27/2022   GLUCOSE 86 04/27/2022   CHOL 149 02/12/2022   TRIG 112 02/12/2022   HDL 36 (L) 02/12/2022   LDLDIRECT 91 02/12/2022   LDLCALC 92 02/12/2022   ALT 36 10/22/2021   AST 30 10/22/2021   NA 140 04/27/2022   K 4.2 04/27/2022   CL 103 04/27/2022   CREATININE 1.36 (H) 04/27/2022   BUN 22 04/27/2022   CO2 25 04/27/2022   TSH 0.62 04/27/2022   PSA 0.4 09/25/2019   INR 0.9 10/22/2021   HGBA1C 5.2 04/01/2021   Assessment/Plan:  Wayne Morales is a 51 y.o. Other or two or more races [6] male with  has a past medical history of Atrial fibrillation (HCC), Chronic back pain, Hyperlipidemia, Hypertension, and Scalp psoriasis (12/15/2012).  Panic anxiety syndrome Uncontrolled but improved with hydroxyzine prn, ok to continue counseling, also start celexa 20 mg qd to least avoid the panic element and frequent ED visits  Essential hypertension BP Readings from Last 3 Encounters:  05/19/22 126/78  04/27/22 126/80  02/04/22 123/75   Stable, pt to continue medical treatment cardizem CD 240 qd, bystolic 10 qd, benicar 40 qd\  GERD (gastroesophageal reflux disease) Mild to mod, for otc pepcid prn,  to f/u any worsening symptoms or concerns  Followup: Return in about 3 months (around 08/19/2022) for to Dr 10/18/2022.  Yetta Barre, MD  05/19/2022 7:27 PM [facility]

## 2022-08-16 ENCOUNTER — Other Ambulatory Visit (HOSPITAL_COMMUNITY): Payer: Self-pay

## 2022-08-16 ENCOUNTER — Encounter: Payer: Self-pay | Admitting: Cardiology

## 2022-08-16 ENCOUNTER — Other Ambulatory Visit: Payer: Self-pay

## 2022-08-16 ENCOUNTER — Other Ambulatory Visit: Payer: Self-pay | Admitting: Internal Medicine

## 2022-08-16 DIAGNOSIS — E785 Hyperlipidemia, unspecified: Secondary | ICD-10-CM

## 2022-08-16 MED ORDER — ROSUVASTATIN CALCIUM 20 MG PO TABS
20.0000 mg | ORAL_TABLET | Freq: Every day | ORAL | 0 refills | Status: DC
  Filled 2022-08-16: qty 30, 30d supply, fill #0
  Filled 2022-09-08: qty 30, 30d supply, fill #1
  Filled 2022-10-21: qty 30, 30d supply, fill #2

## 2022-08-16 NOTE — Telephone Encounter (Signed)
From patient.

## 2022-08-18 ENCOUNTER — Emergency Department (HOSPITAL_BASED_OUTPATIENT_CLINIC_OR_DEPARTMENT_OTHER)
Admission: EM | Admit: 2022-08-18 | Discharge: 2022-08-19 | Disposition: A | Discharge status: Home / Self Care | Payer: 59 | Attending: Emergency Medicine | Admitting: Emergency Medicine

## 2022-08-18 DIAGNOSIS — I10 Essential (primary) hypertension: Secondary | ICD-10-CM | POA: Diagnosis not present

## 2022-08-18 DIAGNOSIS — R079 Chest pain, unspecified: Secondary | ICD-10-CM | POA: Diagnosis present

## 2022-08-18 DIAGNOSIS — R002 Palpitations: Secondary | ICD-10-CM

## 2022-08-18 DIAGNOSIS — Z79899 Other long term (current) drug therapy: Secondary | ICD-10-CM | POA: Diagnosis not present

## 2022-08-18 DIAGNOSIS — R0789 Other chest pain: Secondary | ICD-10-CM | POA: Diagnosis not present

## 2022-08-18 NOTE — ED Triage Notes (Signed)
   Patient comes in with L sided chest pain that started around 2130 this evening.  Patient states the pain radiated to his L shoulder, and felt dizzy.  Patient states he was hypertensive and felt "hot flash" from shoulders to knees.  Patient has hx of afib, PVCs, and anxiety.  Denies any N/V, or diaphoresis.  Took 324 ASA before arrival.  No pain at this time.  6/10, pressure when active.

## 2022-08-19 ENCOUNTER — Encounter (HOSPITAL_BASED_OUTPATIENT_CLINIC_OR_DEPARTMENT_OTHER): Payer: Self-pay | Admitting: Emergency Medicine

## 2022-08-19 ENCOUNTER — Other Ambulatory Visit: Payer: Self-pay

## 2022-08-19 ENCOUNTER — Emergency Department (HOSPITAL_BASED_OUTPATIENT_CLINIC_OR_DEPARTMENT_OTHER): Payer: 59 | Admitting: Radiology

## 2022-08-19 LAB — CBC WITH DIFFERENTIAL/PLATELET
Abs Immature Granulocytes: 0.02 10*3/uL (ref 0.00–0.07)
Basophils Absolute: 0 10*3/uL (ref 0.0–0.1)
Basophils Relative: 0 %
Eosinophils Absolute: 0.1 10*3/uL (ref 0.0–0.5)
Eosinophils Relative: 2 %
HCT: 43.2 % (ref 39.0–52.0)
Hemoglobin: 15.1 g/dL (ref 13.0–17.0)
Immature Granulocytes: 0 %
Lymphocytes Relative: 29 %
Lymphs Abs: 2.4 10*3/uL (ref 0.7–4.0)
MCH: 31.8 pg (ref 26.0–34.0)
MCHC: 35 g/dL (ref 30.0–36.0)
MCV: 90.9 fL (ref 80.0–100.0)
Monocytes Absolute: 0.7 10*3/uL (ref 0.1–1.0)
Monocytes Relative: 9 %
Neutro Abs: 5 10*3/uL (ref 1.7–7.7)
Neutrophils Relative %: 60 %
Platelets: 188 10*3/uL (ref 150–400)
RBC: 4.75 MIL/uL (ref 4.22–5.81)
RDW: 12 % (ref 11.5–15.5)
WBC: 8.4 10*3/uL (ref 4.0–10.5)
nRBC: 0 % (ref 0.0–0.2)

## 2022-08-19 LAB — BASIC METABOLIC PANEL
Anion gap: 9 (ref 5–15)
BUN: 19 mg/dL (ref 6–20)
CO2: 25 mmol/L (ref 22–32)
Calcium: 9.9 mg/dL (ref 8.9–10.3)
Chloride: 106 mmol/L (ref 98–111)
Creatinine, Ser: 1.28 mg/dL — ABNORMAL HIGH (ref 0.61–1.24)
GFR, Estimated: >60 mL/min (ref >60)
Glucose, Bld: 117 mg/dL — ABNORMAL HIGH (ref 70–99)
Potassium: 4 mmol/L (ref 3.5–5.1)
Sodium: 140 mmol/L (ref 135–145)

## 2022-08-19 LAB — TROPONIN I (HIGH SENSITIVITY)
Troponin I (High Sensitivity): 2 ng/L (ref <18)
Troponin I (High Sensitivity): 2 ng/L (ref <18)

## 2022-08-19 MED ORDER — ACETAMINOPHEN 500 MG PO TABS
1000.0000 mg | ORAL_TABLET | Freq: Once | ORAL | Status: AC
  Administered 2022-08-19: 1000 mg via ORAL
  Filled 2022-08-19: qty 2

## 2022-08-19 NOTE — ED Notes (Signed)
Patient denies any pain or palpitations

## 2022-08-19 NOTE — ED Provider Notes (Signed)
[facility] Provider Note: Wayne Spurling, MD, FACEP  CSN: [id number] MRN: [id number] ARRIVAL: 08/18/22 at 2347 ROOM: DB009/DB009   CHIEF COMPLAINT  Chest Pain   HISTORY OF PRESENT ILLNESS  08/19/22 12:06 AM Wayne Morales is a 52 y.o. male with history of PVCs and atrial fibrillation.    About 9:30 PM yesterday evening he felt a wave of pressure (6 out of 10) go across his upper chest from left to right.  With this he felt a generalized hot flash from his head down to his knees.  He felt a headache in the back of his head and he felt his heart rate increased into the 90s from the 60s.  This lasted about 1 minute.  He subsequently had 3 additional episodes over a period of 15 minutes and so came to the ED.  EMS was called but they found his EKG to be normal so he elected to come to the ED by private vehicle.  He had no diaphoresis, shortness of breath or nausea with this.  Right now he complains only of a headache.  He has had similar episodes in the past but states these were more severe.   Past Medical History:  Diagnosis Date   Atrial fibrillation ([place])    Chronic back pain    Hyperlipidemia    diet controlled   Hypertension    on meds   Scalp psoriasis 12/15/2012    Past Surgical History:  Procedure Laterality Date   INGUINAL HERNIA REPAIR Left    as a child    Family History  Problem Relation Age of Onset   Hypertension Mother    Hyperlipidemia Mother    Supraventricular tachycardia Mother    Diabetes Father    Colon polyps Father 39   Colon cancer Father 95   Hypertension Sister    Brain cancer Brother    Heart disease Brother    Sarcoidosis Brother    Sarcoidosis Brother     Social History   Tobacco Use   Smoking status: Never   Smokeless tobacco: Never  Vaping Use   Vaping Use: Never used  Substance Use Topics   Alcohol use: Yes    Comment: socially   Drug use: Never    Prior to Admission medications   Medication Sig Start Date End  Date Taking? Authorizing Provider  Ascorbic Acid (VITAMIN C PO) Take 1,000 mg by mouth daily at 6 (six) AM.    [provider]  citalopram (CELEXA) 20 MG tablet Take 1 tablet (20 mg total) by mouth daily. 05/19/22   Biagio Borg, MD  diltiazem (CARDIZEM CD) 240 MG 24 hr capsule Take 1 capsule (240 mg total) by mouth daily. 12/25/21 12/20/22  Adrian Prows, MD  hydrOXYzine (ATARAX) 10 MG tablet Take 1 tablet (10 mg total) by mouth every 6 (six) hours as needed. 05/17/22   Janith Lima, MD  nebivolol (BYSTOLIC) 10 MG tablet Take 1 tablet (10 mg total) by mouth daily. Take ont tab extra if SBP > 140 mm Hg 04/23/22   Adrian Prows, MD  olmesartan (BENICAR) 40 MG tablet TAKE 1 TABLET(40 MG) BY MOUTH DAILY 03/02/22   Adrian Prows, MD  omega-3 acid ethyl esters (LOVAZA) 1 g capsule Take 2 capsules (2 g total) by mouth 2 (two) times daily. 02/04/22   Adrian Prows, MD  rosuvastatin (CRESTOR) 20 MG tablet Take 1 tablet (20 mg total) by mouth daily. 08/16/22   Janith Lima, MD  Allergies Penicillins   REVIEW OF SYSTEMS  Negative except as noted here or in the History of Present Illness.   PHYSICAL EXAMINATION  Initial Vital Signs Blood pressure (!) 149/95, pulse 74, temperature 98.1 F (36.7 C), temperature source Oral, resp. rate 18, height 5\' 7"  (1.702 m), weight 89.8 kg, SpO2 97 %.  Examination General: Well-developed, well-nourished male in no acute distress; appearance consistent with age of record HENT: normocephalic; atraumatic Eyes: Normal appearance Neck: supple Heart: regular rate and rhythm Lungs: clear to auscultation bilaterally Abdomen: soft; nondistended; nontender; bowel sounds present Extremities: No deformity; full range of motion; pulses normal Neurologic: Awake, alert and oriented; motor function intact in all extremities and symmetric; no facial droop Skin: Warm and dry Psychiatric: Normal mood and affect   RESULTS  Summary of this visit's results, reviewed and  interpreted by myself:   EKG Interpretation  Date/Time:  Thursday August 19 2022 00:27:44 EST Ventricular Rate:  78 PR Interval:  208 QRS Duration: 95 QT Interval:  362 QTC Calculation: 413 R Axis:   18 Text Interpretation: Sinus rhythm Borderline prolonged PR interval ST elev, probable normal early repol pattern Serial comparison not performed, all previous tracings are of poor data quality Confirmed by Shanon Rosser [phone]) on 08/19/2022 12:30:38 AM       Laboratory Studies: Results for orders placed or performed during the hospital encounter of 08/18/22 (from the past 24 hour(s))  Basic metabolic panel     Status: Abnormal   Collection Time: 08/19/22 12:05 AM  Result Value Ref Range   Sodium 140 135 - 145 mmol/L   Potassium 4.0 3.5 - 5.1 mmol/L   Chloride 106 98 - 111 mmol/L   CO2 25 22 - 32 mmol/L   Glucose, Bld 117 (H) 70 - 99 mg/dL   BUN 19 6 - 20 mg/dL   Creatinine, Ser 1.28 (H) 0.61 - 1.24 mg/dL   Calcium 9.9 8.9 - 10.3 mg/dL   GFR, Estimated >60 >60 mL/min   Anion gap 9 5 - 15  Troponin I (High Sensitivity)     Status: None   Collection Time: 08/19/22 12:05 AM  Result Value Ref Range   Troponin I (High Sensitivity) 2 <18 ng/L  CBC with Differential     Status: None   Collection Time: 08/19/22 12:05 AM  Result Value Ref Range   WBC 8.4 4.0 - 10.5 K/uL   RBC 4.75 4.22 - 5.81 MIL/uL   Hemoglobin 15.1 13.0 - 17.0 g/dL   HCT 43.2 39.0 - 52.0 %   MCV 90.9 80.0 - 100.0 fL   MCH 31.8 26.0 - 34.0 pg   MCHC 35.0 30.0 - 36.0 g/dL   RDW 12.0 11.5 - 15.5 %   Platelets 188 150 - 400 K/uL   nRBC 0.0 0.0 - 0.2 %   Neutrophils Relative % 60 %   Neutro Abs 5.0 1.7 - 7.7 K/uL   Lymphocytes Relative 29 %   Lymphs Abs 2.4 0.7 - 4.0 K/uL   Monocytes Relative 9 %   Monocytes Absolute 0.7 0.1 - 1.0 K/uL   Eosinophils Relative 2 %   Eosinophils Absolute 0.1 0.0 - 0.5 K/uL   Basophils Relative 0 %   Basophils Absolute 0.0 0.0 - 0.1 K/uL   Immature Granulocytes 0 %   Abs  Immature Granulocytes 0.02 0.00 - 0.07 K/uL  Troponin I (High Sensitivity)     Status: None   Collection Time: 08/19/22  2:03 AM  Result Value Ref Range  Troponin I (High Sensitivity) 2 <18 ng/L   Imaging Studies: DG Chest 2 View  Result Date: 08/19/2022 CLINICAL DATA:  Left chest pain EXAM: CHEST - 2 VIEW COMPARISON:  11/01/2021 FINDINGS: Lungs are clear.  No pleural effusion or pneumothorax. The heart is normal in size. Visualized osseous structures are within normal limits. IMPRESSION: Normal chest radiographs. Electronically Signed   By: Julian Hy M.D.   On: 08/19/2022 00:18    ED COURSE and MDM  Nursing notes, initial and subsequent vitals signs, including pulse oximetry, reviewed and interpreted by myself.  Vitals:   08/19/22 0145 08/19/22 0200 08/19/22 0215 08/19/22 0230  BP: 122/72 127/81 130/73 (!) 147/97  Pulse: 60 65 62 61  Resp: 17 18 10 15   Temp:      TempSrc:      SpO2: 94% 98% 95% 94%  Weight:      Height:       Medications  acetaminophen (TYLENOL) tablet 1,000 mg (1,000 mg Oral Given 08/19/22 0024)   2:38 AM Patient's EKG is nonischemic and his troponins are both 2.  He has been asymptomatic in the ED.  His rhythm strip for his entire time on the monitor was reviewed and he had no ectopy or other arrhythmia.  I suspect he had 4 episodes of some arrhythmia at home which caused his symptoms.  He is a patient of Dr. Einar Gip of cardiology and we will have him follow-up as an outpatient or have him return should symptoms return or worsen.   PROCEDURES  Procedures   ED DIAGNOSES     ICD-10-CM   1. Atypical chest pain  R07.89 Ambulatory referral to Cardiology    2. Palpitations  R00.2 Ambulatory referral to Cardiology         Shanon Rosser, MD 08/19/22 [phone]

## 2022-08-25 ENCOUNTER — Ambulatory Visit: Payer: 59 | Admitting: Cardiology

## 2022-08-25 ENCOUNTER — Encounter: Payer: Self-pay | Admitting: Cardiology

## 2022-08-25 VITALS — BP 127/77 | HR 77 | Ht 67.0 in | Wt 199.8 lb

## 2022-08-25 DIAGNOSIS — E781 Pure hyperglyceridemia: Secondary | ICD-10-CM

## 2022-08-25 DIAGNOSIS — R931 Abnormal findings on diagnostic imaging of heart and coronary circulation: Secondary | ICD-10-CM

## 2022-08-25 DIAGNOSIS — R072 Precordial pain: Secondary | ICD-10-CM

## 2022-08-25 DIAGNOSIS — K219 Gastro-esophageal reflux disease without esophagitis: Secondary | ICD-10-CM

## 2022-08-25 MED ORDER — METOPROLOL TARTRATE 50 MG PO TABS: 50.0000 mg | ORAL_TABLET | ORAL | 0 refills | Status: DC

## 2022-08-25 MED ORDER — PANTOPRAZOLE SODIUM 40 MG PO TBEC: 40.0000 mg | DELAYED_RELEASE_TABLET | ORAL | 1 refills | Status: DC

## 2022-08-25 NOTE — Progress Notes (Signed)
Primary Physician/Referring:  Etta Grandchild, MD  Patient ID: Wayne Morales, male    DOB: 30-Mar-1971, 52 y.o.   MRN: [id number]  Chief Complaint  Patient presents with   Precordial chest pain   Follow-up    PVC's, hot flash, tingling in arm, arm tremors   HPI:    Wayne Morales  is a 52 y.o. African-American male patient with hypertension, chronic palpitations, precordial pain, coronary calcium score  in 96 percentile in May 2023.  He also has familial hypertriglyceridemia.  He presented to the emergency room on 08/19/2022 with chest pressure across the upper chest and 5 warm all over.  He was discharged home after evaluation and recommended outpatient follow-up.   Patient states that he felt palpitations, tightness in the middle of the chest feeling tingling and numbness in his left arm and also associated with palpitations.  He has been extremely concerned about this and wanted to be further evaluated.  He is also noticed marked increase in acid reflux and feels chest burning sensation as well.  This started about 2 weeks ago.  He has made lifestyle changes with regard to his diet, given up most of the carbohydrates and also red meats.  He is now tolerating Lovaza 4 g/day and also Crestor.  Past Medical History:  Diagnosis Date   Atrial fibrillation (HCC)    Chronic back pain    Hyperlipidemia    diet controlled   Hypertension    on meds   Scalp psoriasis 12/15/2012   Past Surgical History:  Procedure Laterality Date   INGUINAL HERNIA REPAIR Left    as a child   Family History  Problem Relation Age of Onset   Hypertension Mother    Hyperlipidemia Mother    Supraventricular tachycardia Mother    Diabetes Father    Colon polyps Father 45   Colon cancer Father 20   Hypertension Sister    Brain cancer Brother    Heart disease Brother    Sarcoidosis Brother    Sarcoidosis Brother     Social History   Tobacco Use   Smoking status: Never   Smokeless tobacco:  Never  Substance Use Topics   Alcohol use: Yes    Comment: socially   Marital Status: Married  ROS  Review of Systems  Cardiovascular:  Positive for palpitations. Negative for chest pain, dyspnea on exertion and leg swelling.   Objective  Blood pressure 127/77, pulse 77, height 5\' 7"  (1.702 m), weight 199 lb 12.8 oz (90.6 kg), SpO2 96 %. Body mass index is 31.29 kg/m.     08/25/2022   12:50 PM 08/19/2022    2:45 AM 08/19/2022    2:30 AM  Vitals with BMI  Height 5\' 7"     Weight 199 lbs 13 oz    BMI 31.29    Systolic 127 137 10/18/2022  Diastolic 77 93 97  Pulse 77 67 61     Physical Exam Neck:     Vascular: No JVD.  Cardiovascular:     Rate and Rhythm: Normal rate and regular rhythm.     Pulses: Intact distal pulses.     Heart sounds: A midsystolic click. No murmur heard.    No gallop.  Pulmonary:     Effort: Pulmonary effort is normal.     Breath sounds: Normal breath sounds.  Abdominal:     General: Bowel sounds are normal.     Palpations: Abdomen is soft.  Musculoskeletal:     Right lower  leg: No edema.     Left lower leg: No edema.    Medications and allergies   Allergies  Allergen Reactions   Penicillins Hives and Rash    REACTION: Rash    Medication list after today's encounter   Current Outpatient Medications:    APPLE CIDER VINEGAR PO, Take 2 tablets by mouth daily at 2 PM., Disp: , Rfl:    Ascorbic Acid (VITAMIN C PO), Take 1,000 mg by mouth daily at 6 (six) AM., Disp: , Rfl:    Calcium 500-2.5 MG-MCG CHEW, Chew by mouth., Disp: , Rfl:    citalopram (CELEXA) 20 MG tablet, Take 1 tablet (20 mg total) by mouth daily., Disp: 90 tablet, Rfl: 3   diltiazem (CARDIZEM CD) 240 MG 24 hr capsule, Take 1 capsule (240 mg total) by mouth daily., Disp: 90 capsule, Rfl: 3   ELDERBERRY PO, Take by mouth., Disp: , Rfl:    metoprolol tartrate (LOPRESSOR) 50 MG tablet, Take 1 tablet (50 mg total) by mouth as directed for 2 days. Start one day before CT scan and on the day of take  one in morning and one 1 hour before, Disp: 4 tablet, Rfl: 0   nebivolol (BYSTOLIC) 10 MG tablet, Take 1 tablet (10 mg total) by mouth daily. Take ont tab extra if SBP > 140 mm Hg, Disp: 30 tablet, Rfl: 2   olmesartan (BENICAR) 40 MG tablet, TAKE 1 TABLET(40 MG) BY MOUTH DAILY, Disp: 30 tablet, Rfl: 3   omega-3 acid ethyl esters (LOVAZA) 1 g capsule, Take 2 capsules (2 g total) by mouth 2 (two) times daily., Disp: 360 capsule, Rfl: 3   pantoprazole (PROTONIX) 40 MG tablet, Take 1 tablet (40 mg total) by mouth as directed. 1 tablet 30 minutes before breakfast for 2 weeks, followed by take as needed for heartburn, Disp: 30 tablet, Rfl: 1   rosuvastatin (CRESTOR) 20 MG tablet, Take 1 tablet (20 mg total) by mouth daily., Disp: 90 tablet, Rfl: 0  Laboratory examination:   Recent Labs    10/27/21 1216 11/01/21 0130 02/12/22 0929 04/27/22 1644 08/19/22 0005  NA 138 134* 142 140 140  K 3.8 3.5 4.3 4.2 4.0  CL 100 101 104 103 106  CO2 23 21* 22 25 25   GLUCOSE 128* 131* 100* 86 117*  BUN 21* 23* 22 22 19   CREATININE 1.47* 1.34* 1.26 1.36* 1.28*  CALCIUM 10.7* 9.5 9.8 10.2 9.9  GFRNONAA 58* >60  --   --  >60      Latest Ref Rng & Units 08/19/2022   12:05 AM 04/27/2022    4:44 PM 02/12/2022    9:29 AM  CMP  Glucose 70 - 99 mg/dL 117  86  100   BUN 6 - 20 mg/dL 19  22  22    Creatinine 0.61 - 1.24 mg/dL 1.28  1.36  1.26   Sodium 135 - 145 mmol/L 140  140  142   Potassium 3.5 - 5.1 mmol/L 4.0  4.2  4.3   Chloride 98 - 111 mmol/L 106  103  104   CO2 22 - 32 mmol/L 25  25  22    Calcium 8.9 - 10.3 mg/dL 9.9  10.2  9.8       Latest Ref Rng & Units 08/19/2022   12:05 AM 04/27/2022    4:44 PM 11/01/2021    1:30 AM  CBC  WBC 4.0 - 10.5 K/uL 8.4  9.8  12.3   Hemoglobin 13.0 - 17.0 g/dL  15.1  16.3  17.7   Hematocrit 39.0 - 52.0 % 43.2  45.8  50.1   Platelets 150 - 400 K/uL 188  194  199    Lipid Panel Recent Labs    02/12/22 0929  CHOL 149  TRIG 112  LDLCALC 92  HDL 36*  LDLDIRECT 91     HEMOGLOBIN A1C Lab Results  Component Value Date   HGBA1C 5.2 04/01/2021   TSH Recent Labs    04/27/22 1644  TSH 0.62   Radiology:   Chest x-ray two-view 11/01/2021: The heart size and mediastinal contours are within normal limits. Both lungs are clear. The visualized skeletal structures are unremarkable.  CT angiogram of head and neck 10/22/2021:  No large vessel occlusion, chronic microvascular changes consistent with hypertension, aortic atherosclerosis.  Cardiac Studies:   Ambulatory cardiac telemetry 7 days (11/13/21 - 11/19/2021): Predominant underlying rhythm was sinus with rare PACs and PVCs.  Patient triggered events correlated with normal sinus rhythm.  Occasional PVC. No evidence of significant cardiac arrhythmias including SVT, A-fib, high degree AV block, pauses >3 seconds, or ventricular tachycardia.  PCV ECHOCARDIOGRAM COMPLETE 11/19/2021  Normal LV systolic function with visual EF 60-65%. Left ventricle cavity is normal in size. Mild left ventricular hypertrophy. Normal global wall motion. Normal diastolic filling pattern, normal LAP. Calculated EF 65%. No significant valvular heart disease. No prior study for comparison.  Coronary calcium score 12/15/2021: LM 0 LAD 42.6 LCx 43.2 RCA 43.2 Total Agatston score is 129, MESA database percentile 96. Ascending and descending aorta measured normal. Small solitary pulmonary nodule of the left lower lobe measuring 4 mm, no follow-up needed if low risk.  No other significant extracardiac abnormality.  PCV MYOCARDIAL PERFUSION WO LEXISCAN 01/13/2022  Narrative Exercise nuclear stress test 01/13/2022: Normal ECG stress. The patient exercised for 9 minutes and 25 seconds of a Bruce protocol, achieving approximately 10.85 METs and 87% of MPHR. No symptoms. The blood pressure response was normal. Negative for ischemia or inducible arrhythmias. Myocardial perfusion is normal. Overall LV systolic function is normal  without regional wall motion abnormalities. Calculated Stress LV EF: 45% but visually normal. No previous exam available for comparison. Low risk.     EKG:   EKG 08/25/2022: Normal sinus rhythm at the rate of 72 bpm, normal EKG.  Compared to 11/13/2021, no significant change.  EKG 08/19/2022: Normal sinus rhythm at the rate of 78 bpm, early repolarization, normal EKG.  Compared to 08/18/2022, no change.  EKG 11/13/2021: Normal sinus rhythm at rate of 64 bpm, incomplete right bundle branch block.  Otherwise normal EKG.    Assessment     ICD-10-CM   1. Precordial pain  R07.2 EKG 12-Lead    CT CORONARY MORPH W/CTA COR W/SCORE W/CA W/CM &/OR WO/CM    metoprolol tartrate (LOPRESSOR) 50 MG tablet    2. Elevated coronary artery calcium score 12/15/2021: Total Agatston score is 129, MESA database percentile 96.  R93.1 CT CORONARY MORPH W/CTA COR W/SCORE W/CA W/CM &/OR WO/CM    3. Familial hypertriglyceridemia  E78.1 CT CORONARY MORPH W/CTA COR W/SCORE W/CA W/CM &/OR WO/CM    4. Gastroesophageal reflux disease without esophagitis  K21.9 pantoprazole (PROTONIX) 40 MG tablet      Medications Discontinued During This Encounter  Medication Reason   hydrOXYzine (ATARAX) 10 MG tablet Patient Preference      Meds ordered this encounter  Medications   metoprolol tartrate (LOPRESSOR) 50 MG tablet    Sig: Take 1 tablet (50 mg total) by  mouth as directed for 2 days. Start one day before CT scan and on the day of take one in morning and one 1 hour before    Dispense:  4 tablet    Refill:  0   pantoprazole (PROTONIX) 40 MG tablet    Sig: Take 1 tablet (40 mg total) by mouth as directed. 1 tablet 30 minutes before breakfast for 2 weeks, followed by take as needed for heartburn    Dispense:  30 tablet    Refill:  1   Orders Placed This Encounter  Procedures   CT CORONARY MORPH W/CTA COR W/SCORE W/CA W/CM &/OR WO/CM    Standing Status:   Future    Standing Expiration Date:   11/23/2022    Order Specific  Question:   If indicated for the ordered procedure, I authorize the administration of contrast media per Radiology protocol    Answer:   Yes    Order Specific Question:   Preferred Imaging Location?    Answer:   [facility]   EKG 12-Lead   Recommendations:   Wayne Morales is a 52 y.o.  African-American male patient with hypertension, chronic palpitations, precordial pain, coronary calcium score  in 96 percentile in May 2023.  He also has familial hypertriglyceridemia.  He presented to the emergency room on 08/19/2022 with chest pressure across the upper chest and 5 warm all over.  He was discharged home after evaluation and recommended outpatient follow-up.  1. Precordial pain Patient chest pain episode appears to be very atypical for angina, precordial and also associated with some palpitations.  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  2. Elevated coronary artery calcium score 12/15/2021: Total Agatston score is 129, MESA database percentile 96. As dictated above in view of persistent chest pain, although his risk factors are well-controlled, I will go ahead and get coronary CTA and make further recommendations.  3. Familial hypertriglyceridemia He has made lifestyle changes with regard to his diet, given up most of the carbohydrates and also red meats.  He is now tolerating Lovaza 4 g/day and also Crestor.  Please note under excellent control.  4.  GERD without esophagitis Patient is also having significant amount of GERD, epigastric mild discomfort, will prescribe him pantoprazole.   Adrian Prows, MD, [facility] 08/25/2022, 1:28 PM Office: [phone]

## 2022-08-25 NOTE — Patient Instructions (Addendum)
Metoprolol tartrate 50 mg  Start one day before CT scan and on the day of take one in morning and one 1 hour before CT scan of heart.  Plenty of fluids 1 day prior.

## 2022-09-03 ENCOUNTER — Telehealth (HOSPITAL_COMMUNITY): Payer: Self-pay | Admitting: Emergency Medicine

## 2022-09-03 NOTE — Telephone Encounter (Signed)
Attempted to call patient regarding upcoming cardiac CT appointment. °Left message on voicemail with name and callback number °Husna Krone RN Navigator Cardiac Imaging °[facility] °[phone] Office °[phone] Cell ° °

## 2022-09-03 NOTE — Telephone Encounter (Signed)
Reaching out to patient to offer assistance regarding upcoming cardiac imaging study; pt verbalizes understanding of appt date/time, parking situation and where to check in, pre-test NPO status and medications ordered, and verified current allergies; name and call back number provided for further questions should they arise Wayne Bond RN Navigator Cardiac Imaging [facility] [phone] office [phone] cell  Arrival 800 [address] entrance 54m metoprolol mon and Tuesday Denies iv issues Aware contrast/nitro

## 2022-09-06 LAB — PSA: PSA: 0.5

## 2022-09-07 ENCOUNTER — Ambulatory Visit: Payer: 59 | Admitting: Internal Medicine

## 2022-09-07 ENCOUNTER — Ambulatory Visit (HOSPITAL_COMMUNITY)
Admission: RE | Admit: 2022-09-07 | Discharge: 2022-09-07 | Disposition: A | Discharge status: Home / Self Care | Payer: 59 | Source: Ambulatory Visit | Attending: Cardiology | Admitting: Cardiology

## 2022-09-07 VITALS — BP 114/62 | HR 60 | Temp 97.7°F | Ht 67.0 in | Wt 205.0 lb

## 2022-09-07 DIAGNOSIS — K760 Fatty (change of) liver, not elsewhere classified: Secondary | ICD-10-CM | POA: Insufficient documentation

## 2022-09-07 DIAGNOSIS — F410 Panic disorder [episodic paroxysmal anxiety] without agoraphobia: Secondary | ICD-10-CM

## 2022-09-07 DIAGNOSIS — I10 Essential (primary) hypertension: Secondary | ICD-10-CM

## 2022-09-07 DIAGNOSIS — I2510 Atherosclerotic heart disease of native coronary artery without angina pectoris: Secondary | ICD-10-CM | POA: Insufficient documentation

## 2022-09-07 DIAGNOSIS — E781 Pure hyperglyceridemia: Secondary | ICD-10-CM | POA: Diagnosis not present

## 2022-09-07 DIAGNOSIS — E559 Vitamin D deficiency, unspecified: Secondary | ICD-10-CM

## 2022-09-07 DIAGNOSIS — R072 Precordial pain: Secondary | ICD-10-CM | POA: Diagnosis present

## 2022-09-07 DIAGNOSIS — R931 Abnormal findings on diagnostic imaging of heart and coronary circulation: Secondary | ICD-10-CM | POA: Diagnosis not present

## 2022-09-07 MED ORDER — CITALOPRAM HYDROBROMIDE 40 MG PO TABS: 40.0000 mg | ORAL_TABLET | Freq: Every day | ORAL | 3 refills | Status: DC

## 2022-09-07 MED ORDER — IOHEXOL 350 MG/ML SOLN
100.0000 mL | Freq: Once | INTRAVENOUS | Status: AC | PRN
  Administered 2022-09-07: 100 mL via INTRAVENOUS

## 2022-09-07 MED ORDER — NITROGLYCERIN 0.4 MG SL SUBL: 0.8000 mg | SUBLINGUAL_TABLET | Freq: Once | SUBLINGUAL | Status: AC

## 2022-09-07 MED ORDER — HYDROXYZINE HCL 25 MG PO TABS: 25.0000 mg | ORAL_TABLET | Freq: Three times a day (TID) | ORAL | 2 refills | Status: DC | PRN

## 2022-09-07 MED ORDER — NITROGLYCERIN 0.4 MG SL SUBL
SUBLINGUAL_TABLET | SUBLINGUAL | Status: AC
  Administered 2022-09-07: 0.8 mg via SUBLINGUAL
  Filled 2022-09-07: qty 2

## 2022-09-07 NOTE — Patient Instructions (Signed)
Ok to increase the citalopram to 40 mg per day  Please continue all other medications as before, and refills have been done if requested - the hydroxyzine  Please have the pharmacy call with any other refills you may need.  Please continue your efforts at being more active, low cholesterol diet, and weight control.  Please keep your appointments with your specialists as you may have planned  You are given the work note today

## 2022-09-07 NOTE — Progress Notes (Signed)
Patient ID: Wayne Morales, male   DOB: 24-Aug-1970, 52 y.o.   MRN: [id number]        Chief Complaint: follow up panic anxiety, htn, low vit d       HPI:  Wayne Morales is a 52 y.o. male here with c/o worsening recent panic anxiety having run out of hydroxyzine and asked to make f/u appt here.  Pt denies chest pain, increased sob or doe, wheezing, orthopnea, PND, increased LE swelling, palpitations, dizziness or syncope.   Pt denies polydipsia, polyuria, or new focal neuro s/s.    Pt denies fever, wt loss, night sweats, loss of appetite, or other constitutional symptoms         Wt Readings from Last 3 Encounters:  09/07/22 205 lb (93 kg)  08/25/22 199 lb 12.8 oz (90.6 kg)  08/19/22 198 lb (89.8 kg)   BP Readings from Last 3 Encounters:  09/07/22 114/80  09/07/22 114/62  08/25/22 127/77         Past Medical History:  Diagnosis Date   Atrial fibrillation (HCC)    Chronic back pain    Hyperlipidemia    diet controlled   Hypertension    on meds   Scalp psoriasis 12/15/2012   Past Surgical History:  Procedure Laterality Date   INGUINAL HERNIA REPAIR Left    as a child    reports that he has never smoked. He has never used smokeless tobacco. He reports current alcohol use. He reports that he does not use drugs. family history includes Brain cancer in his brother; Colon cancer (age of onset: 8) in his father; Colon polyps (age of onset: 79) in his father; Diabetes in his father; Heart disease in his brother; Hyperlipidemia in his mother; Hypertension in his mother and sister; Sarcoidosis in his brother and brother; Supraventricular tachycardia in his mother. Allergies  Allergen Reactions   Penicillins Hives and Rash    REACTION: Rash   Current Outpatient Medications on File Prior to Visit  Medication Sig Dispense Refill   APPLE CIDER VINEGAR PO Take 2 tablets by mouth daily at 2 PM.     Ascorbic Acid (VITAMIN C PO) Take 1,000 mg by mouth daily at 6 (six) AM.     Calcium  500-2.5 MG-MCG CHEW Chew by mouth.     diltiazem (CARDIZEM CD) 240 MG 24 hr capsule Take 1 capsule (240 mg total) by mouth daily. 90 capsule 3   ELDERBERRY PO Take by mouth.     nebivolol (BYSTOLIC) 10 MG tablet Take 1 tablet (10 mg total) by mouth daily. Take ont tab extra if SBP > 140 mm Hg 30 tablet 2   olmesartan (BENICAR) 40 MG tablet TAKE 1 TABLET(40 MG) BY MOUTH DAILY 30 tablet 3   omega-3 acid ethyl esters (LOVAZA) 1 g capsule Take 2 capsules (2 g total) by mouth 2 (two) times daily. 360 capsule 3   pantoprazole (PROTONIX) 40 MG tablet Take 1 tablet (40 mg total) by mouth as directed. 1 tablet 30 minutes before breakfast for 2 weeks, followed by take as needed for heartburn 30 tablet 1   rosuvastatin (CRESTOR) 20 MG tablet Take 1 tablet (20 mg total) by mouth daily. 90 tablet 0   metoprolol tartrate (LOPRESSOR) 50 MG tablet Take 1 tablet (50 mg total) by mouth as directed for 2 days. Start one day before CT scan and on the day of take one in morning and one 1 hour before 4 tablet 0   No current facility-administered  medications on file prior to visit.        ROS:  All others reviewed and negative.  Objective        PE:  BP 114/62 (BP Location: Right Leg, Patient Position: Sitting, Cuff Size: Large)   Pulse 60   Temp 97.7 F (36.5 C) (Oral)   Ht '5\' 7"'$  (1.702 m)   Wt 205 lb (93 kg)   SpO2 97%   BMI 32.11 kg/m                 Constitutional: Pt appears in NAD               HENT: Head: NCAT.                Right Ear: External ear normal.                 Left Ear: External ear normal.                Eyes: . Pupils are equal, round, and reactive to light. Conjunctivae and EOM are normal               Nose: without d/c or deformity               Neck: Neck supple. Gross normal ROM               Cardiovascular: Normal rate and regular rhythm.                 Pulmonary/Chest: Effort normal and breath sounds without rales or wheezing.                               Neurological: Pt  is alert. At baseline orientation, motor grossly intact               Skin: Skin is warm. No rashes, no other new lesions, LE edema - none               Psychiatric: Pt behavior is normal without agitation but mod to severe nervous  Micro: none  Cardiac tracings I have personally interpreted today:  none  Pertinent Radiological findings (summarize): none   Lab Results  Component Value Date   WBC 8.4 08/19/2022   HGB 15.1 08/19/2022   HCT 43.2 08/19/2022   PLT 188 08/19/2022   GLUCOSE 117 (H) 08/19/2022   CHOL 149 02/12/2022   TRIG 112 02/12/2022   HDL 36 (L) 02/12/2022   LDLDIRECT 91 02/12/2022   LDLCALC 92 02/12/2022   ALT 36 10/22/2021   AST 30 10/22/2021   NA 140 08/19/2022   K 4.0 08/19/2022   CL 106 08/19/2022   CREATININE 1.28 (H) 08/19/2022   BUN 19 08/19/2022   CO2 25 08/19/2022   TSH 0.62 04/27/2022   PSA 0.4 09/25/2019   INR 0.9 10/22/2021   HGBA1C 5.2 04/01/2021   Assessment/Plan:  Wayne Morales is a 52 y.o. Other or two or more races [6] male with  has a past medical history of Atrial fibrillation ([place]), Chronic back pain, Hyperlipidemia, Hypertension, and Scalp psoriasis (12/15/2012).  Panic anxiety syndrome Uncontroleld recent worsening, ok for restart hydroxyzine prn, also increase the celexa 40 mg qd, and gave work note today  Essential hypertension BP Readings from Last 3 Encounters:  09/07/22 114/80  09/07/22 114/62  08/25/22 127/77   Stable, pt to continue medical treatment cardizem cd [id number] qd, bystolic  10 qd, benicar 40 mg qd   Vitamin D deficiency disease Last vitamin D Lab Results  Component Value Date   VD25OH 13.33 (L) 04/01/2021   Lo, to start oral replacement  Followup: Return if symptoms worsen or fail to improve.  Cathlean Cower, MD 09/11/2022 4:08 PM [facility]

## 2022-09-08 DIAGNOSIS — R931 Abnormal findings on diagnostic imaging of heart and coronary circulation: Secondary | ICD-10-CM | POA: Insufficient documentation

## 2022-09-08 DIAGNOSIS — R072 Precordial pain: Secondary | ICD-10-CM | POA: Insufficient documentation

## 2022-09-08 NOTE — Progress Notes (Signed)
Coronary CTA 09/08/2022: Coronary calcium score LM 0 LAD 45.8 LCx 59.8 RCA 43.  Total coronary calcium score 149, MESA database 96 percentile. Left Main: Patent.  LAD: Minimal stenosis (0-24%) due mixed plaque at proximal LAD, otherwise mid to distal segment are patent. Ramus: Patent. LCX: Mild stenosis (25-49%) closer to 25% due to calcified plaque at proximal segment otherwise patent. RCA: Mild stenosis (25-49%) closer to 25% due to calcified plaque within proximal segment otherwise vessel is patent. 4. Scattered aortic atherosclerosis. 5. Small patent foramen ovale.

## 2022-09-11 ENCOUNTER — Encounter: Payer: Self-pay | Admitting: Internal Medicine

## 2022-09-11 NOTE — Progress Notes (Signed)
1. No significant extracardiac findings within the visualized lower chest. 2. Hepatic steatosis.

## 2022-09-11 NOTE — Assessment & Plan Note (Signed)
Last vitamin D Lab Results  Component Value Date   VD25OH 13.33 (L) 04/01/2021   Lo, to start oral replacement

## 2022-09-11 NOTE — Assessment & Plan Note (Signed)
BP Readings from Last 3 Encounters:  09/07/22 114/80  09/07/22 114/62  08/25/22 127/77   Stable, pt to continue medical treatment cardizem cd [id number] qd, bystolic 10 qd, benicar 40 mg qd

## 2022-09-11 NOTE — Assessment & Plan Note (Signed)
Uncontroleld recent worsening, ok for restart hydroxyzine prn, also increase the celexa 40 mg qd, and gave work note today

## 2022-09-23 IMAGING — CT CT CARDIAC CORONARY ARTERY CALCIUM SCORE
3 series · 14 of 20 positions shown, 16 images · non-contrast
Comparison: None Available.

CLINICAL DATA: 50-year-old black male

EXAM:
CT CARDIAC CORONARY ARTERY CALCIUM SCORE
TECHNIQUE: Non-contrast imaging through the heart was performed using
prospective ECG gating. Image post processing was performed on an
independent workstation, allowing for quantitative analysis of the
heart and coronary arteries. Note that this exam targets the heart
and the chest was not imaged in its entirety.

[Series 2: calcium scoring 2.00 qr36 bestdiast 70% hrt calciu · axial · 0.38mm/px · z∈[+1467,+1551]mm · 4 of 70 slices shown]
[im 14/70  vessel]
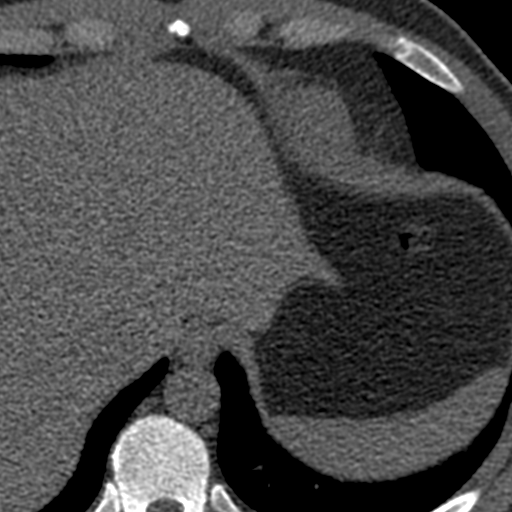
[im 28/70  vessel]
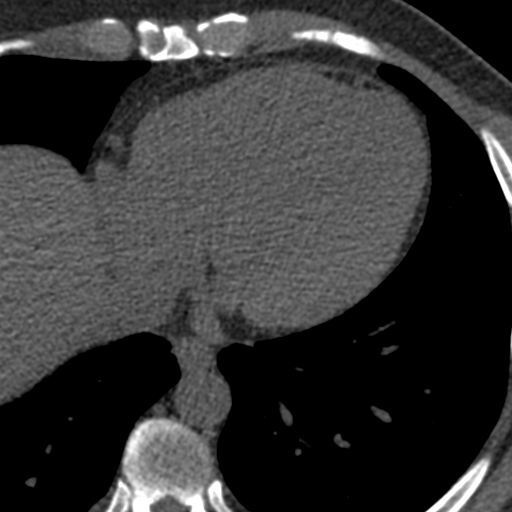
[im 42/70  vessel]
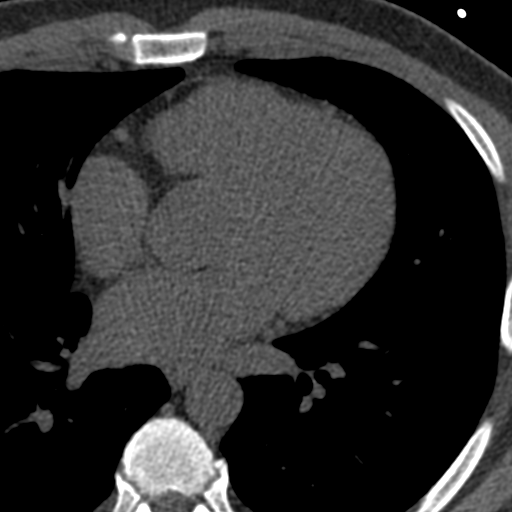
[im 56/70  vessel]
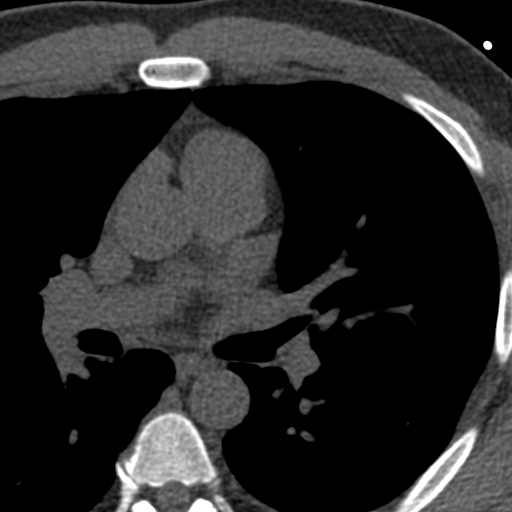

[Series 3: calcium scoring 2.00 br40 bestdiast 70% axial · axial · 0.63mm/px · z∈[+1463,+1555]mm · 5 of 70 slices shown, 7 images]
[im 12/70  vessel]
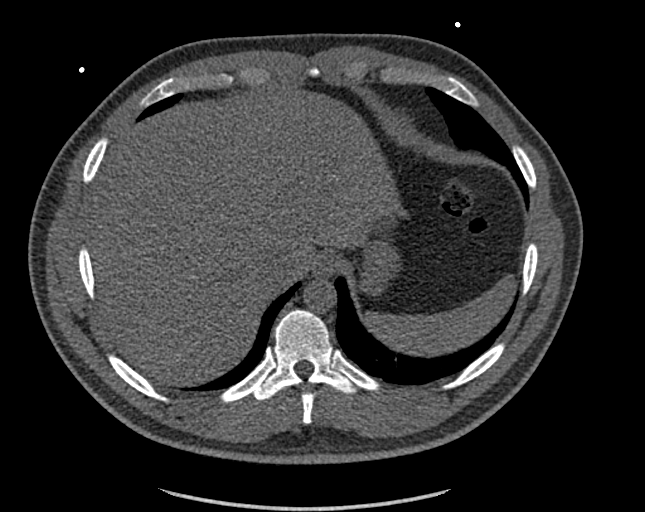
[im 12/70  lung]
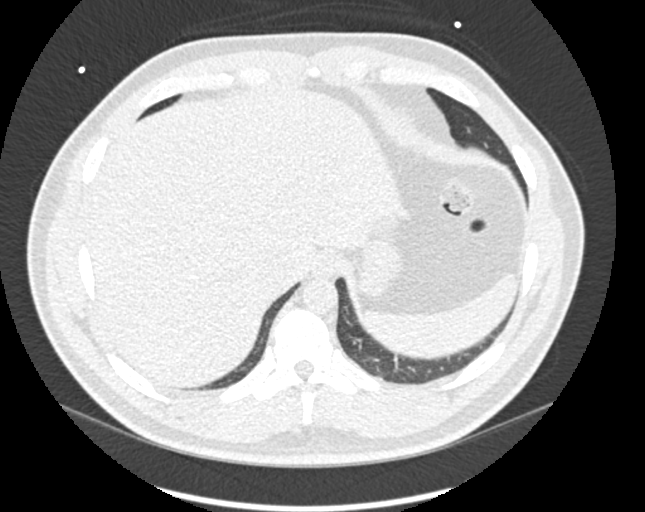
[im 24/70  vessel]
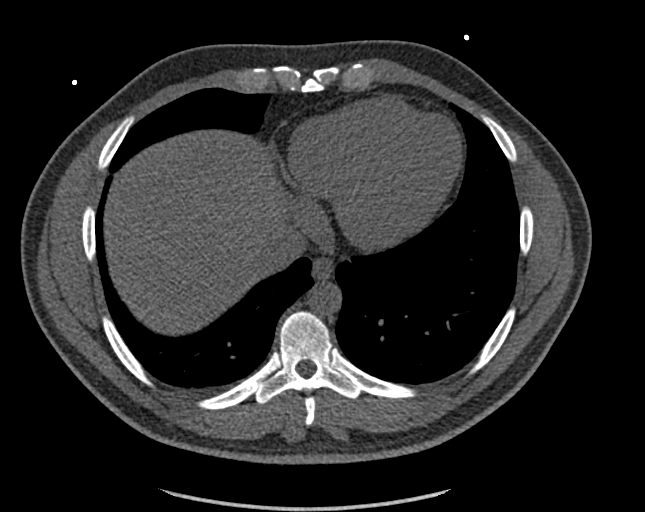
[im 35/70  vessel]
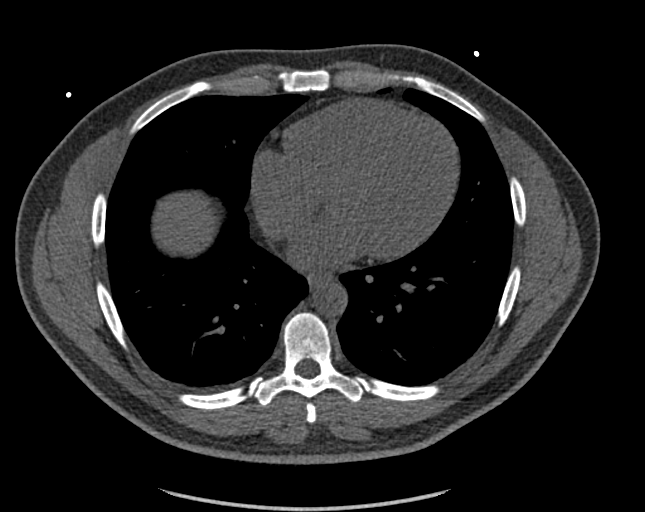
[im 47/70  vessel]
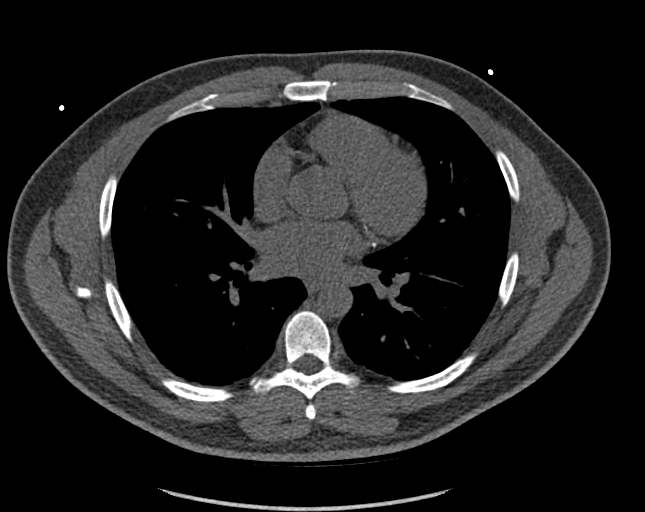
[im 58/70  vessel]
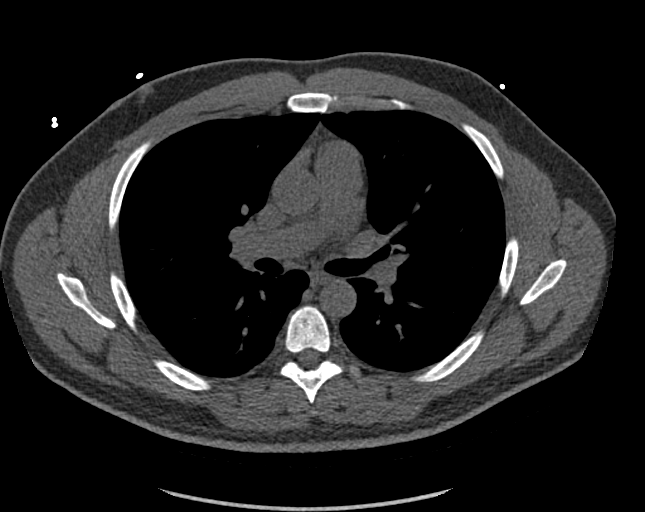
[im 58/70  lung]
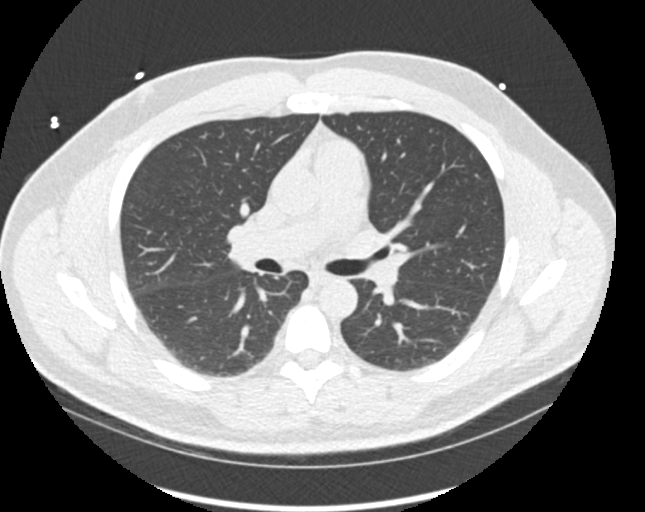

[Series 9: calcium scoring 2.00 br60 bestdiast 70% lungs · axial · 0.63mm/px · z∈[+1463,+1555]mm · 5 of 70 slices shown]
[im 12/70  vessel]
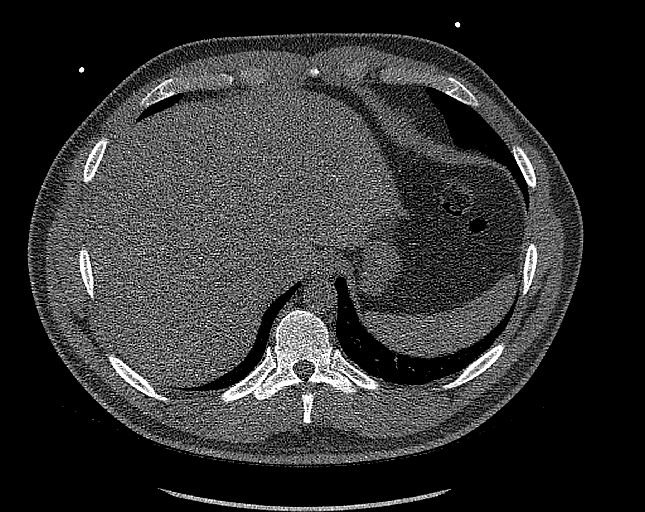
[im 24/70  vessel]
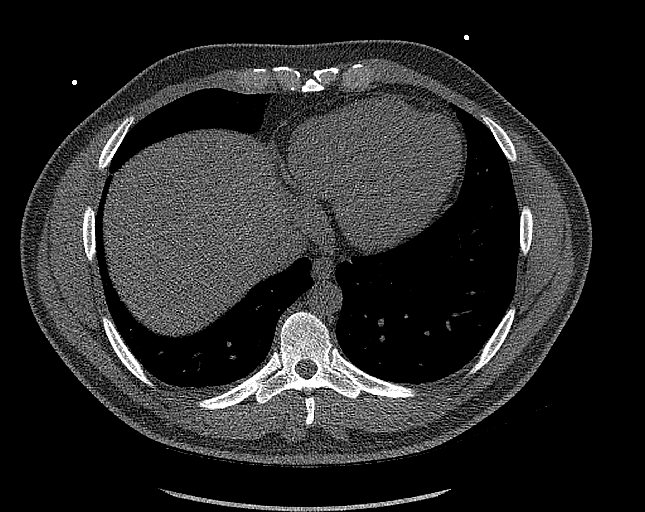
[im 35/70  vessel]
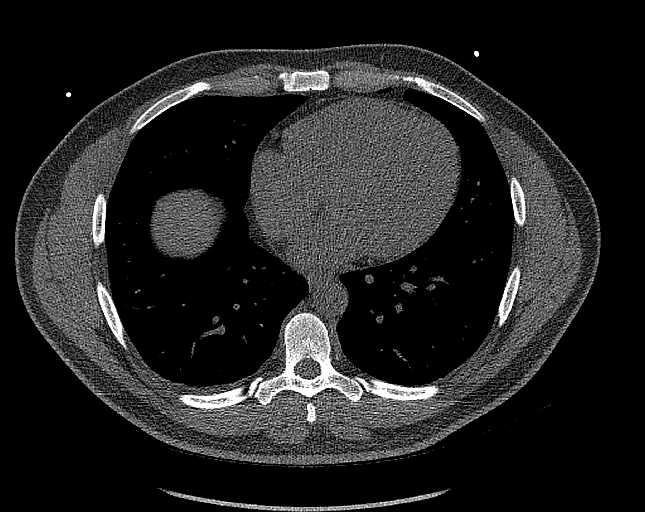
[im 47/70  vessel]
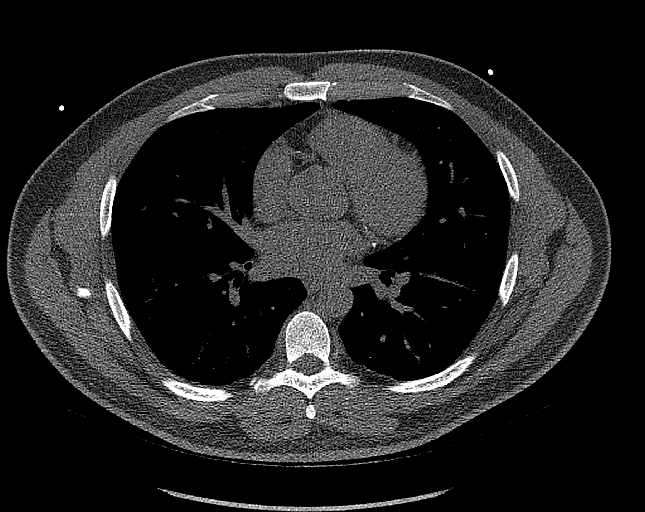
[im 58/70  vessel]
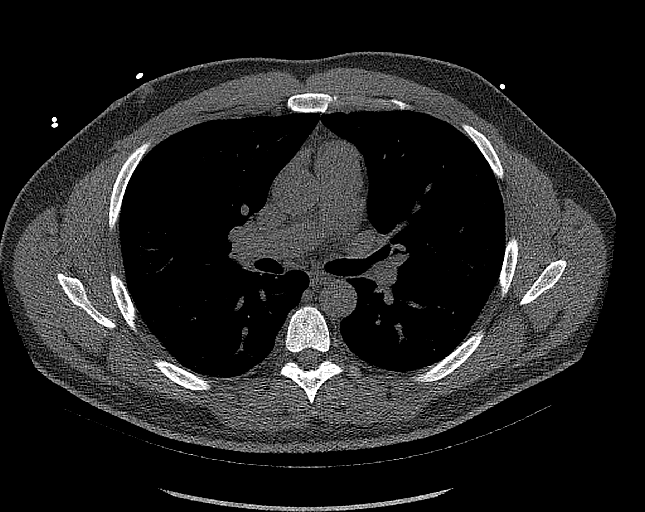

[14 of 20 positions shown; findings below may reference images not displayed]

FINDINGS: CORONARY CALCIUM SCORES:

Left Main: 0

LAD:

LCx:

RCA:

Total Agatston Score: 129

[HOSPITAL] percentile: 96

AORTA MEASUREMENTS:

Ascending Aorta: 28 mm

Descending Aorta: 24 mm

OTHER FINDINGS:

Vascular: Normal heart size. No pericardial effusion. Normal caliber
thoracic aorta with no significant atherosclerotic disease.

Mediastinum/Nodes: Esophagus is unremarkable. No pathologically
enlarged lymph nodes seen in the chest.

Lungs/Pleura: Central airways are patent. No consolidation, pleural
effusion or pneumothorax. Small solid pulmonary nodule of the left
lower lobe measuring 4 mm on series 3, image 48.

Upper Abdomen: No acute abnormality.

Musculoskeletal: No chest wall mass or suspicious bone lesions
identified.
IMPRESSION: 1. Total calcium score of 129 with is at percentile 96 based for
subjects of the same age, gender, and race/ethnicity.
2. Small solid pulmonary nodule of the left lower lobe measuring 4
mm. No follow-up needed if patient is low-risk.This recommendation
follows the consensus statement: Guidelines for Management of
Incidental Pulmonary Nodules Detected on CT Images: From the

## 2022-10-06 ENCOUNTER — Encounter: Payer: Self-pay | Admitting: Cardiology

## 2022-10-06 ENCOUNTER — Ambulatory Visit: Payer: 59 | Admitting: Cardiology

## 2022-10-06 VITALS — BP 124/79 | HR 83 | Resp 17 | Ht 67.0 in | Wt 197.2 lb

## 2022-10-06 DIAGNOSIS — I10 Essential (primary) hypertension: Secondary | ICD-10-CM

## 2022-10-06 DIAGNOSIS — E782 Mixed hyperlipidemia: Secondary | ICD-10-CM

## 2022-10-06 DIAGNOSIS — Z006 Encounter for examination for normal comparison and control in clinical research program: Secondary | ICD-10-CM

## 2022-10-06 DIAGNOSIS — R931 Abnormal findings on diagnostic imaging of heart and coronary circulation: Secondary | ICD-10-CM

## 2022-10-06 NOTE — Progress Notes (Signed)
Primary Physician/Referring:  Janith Lima, MD  Patient ID: Wayne Morales, male    DOB: 02/14/1971, 52 y.o.   MRN: [id number]  No chief complaint on file.  HPI:    Wayne Morales  is a 52 y.o. African-American male patient with hypertension, chronic palpitations, precordial pain, coronary calcium score  in 96 percentile in May 2023.  He also has familial hypercholesterolemia.  Due to chest pain, elevated coronary calcium score, he underwent coronary CT angiogram and presents for follow-up.  Presently asymptomatic. He has made lifestyle changes with regard to his diet, given up most of the carbohydrates and also red meats.  He is now tolerating Lovaza 4 g/day and also Crestor.  Past Medical History:  Diagnosis Date  . Atrial fibrillation ([place])   . Chronic back pain   . Hyperlipidemia    diet controlled  . Hypertension    on meds  . Scalp psoriasis 12/15/2012   Past Surgical History:  Procedure Laterality Date  . INGUINAL HERNIA REPAIR Left    as a child   Family History  Problem Relation Age of Onset  . Hypertension Mother   . Hyperlipidemia Mother   . Supraventricular tachycardia Mother   . Diabetes Father   . Colon polyps Father 49  . Colon cancer Father 48  . Hypertension Sister   . Brain cancer Brother   . Heart disease Brother   . Sarcoidosis Brother   . Sarcoidosis Brother     Social History   Tobacco Use  . Smoking status: Never  . Smokeless tobacco: Never  Substance Use Topics  . Alcohol use: Yes    Comment: socially   Marital Status: Married  ROS  Review of Systems  Cardiovascular:  Negative for chest pain, dyspnea on exertion, leg swelling and palpitations.   Objective  There were no vitals taken for this visit. There is no height or weight on file to calculate BMI.     09/07/2022    1:22 PM 09/07/2022    8:30 AM 09/07/2022    8:15 AM  Vitals with BMI  Height 5\' 7"     Weight 205 lbs    BMI [id number]    Systolic [id number]  Diastolic 62 80  85  Pulse 60       Physical Exam Neck:     Vascular: No JVD.  Cardiovascular:     Rate and Rhythm: Normal rate and regular rhythm.     Pulses: Intact distal pulses.     Heart sounds: A midsystolic click. No murmur heard.    No gallop.  Pulmonary:     Effort: Pulmonary effort is normal.     Breath sounds: Normal breath sounds.  Abdominal:     General: Bowel sounds are normal.     Palpations: Abdomen is soft.  Musculoskeletal:     Right lower leg: No edema.     Left lower leg: No edema.   Medications and allergies   Allergies  Allergen Reactions  . Penicillins Hives and Rash    REACTION: Rash    Medication list after today's encounter   Current Outpatient Medications:  .  APPLE CIDER VINEGAR PO, Take 2 tablets by mouth daily at 2 PM., Disp: , Rfl:  .  Ascorbic Acid (VITAMIN C PO), Take 1,000 mg by mouth daily at 6 (six) AM., Disp: , Rfl:  .  Calcium 500-2.5 MG-MCG CHEW, Chew by mouth., Disp: , Rfl:  .  citalopram (CELEXA) 40 MG tablet, Take  1 tablet (40 mg total) by mouth daily., Disp: 90 tablet, Rfl: 3 .  diltiazem (CARDIZEM CD) 240 MG 24 hr capsule, Take 1 capsule (240 mg total) by mouth daily., Disp: 90 capsule, Rfl: 3 .  ELDERBERRY PO, Take by mouth., Disp: , Rfl:  .  hydrOXYzine (ATARAX) 25 MG tablet, Take 1 tablet (25 mg total) by mouth 3 (three) times daily as needed., Disp: 60 tablet, Rfl: 2 .  metoprolol tartrate (LOPRESSOR) 50 MG tablet, Take 1 tablet (50 mg total) by mouth as directed for 2 days. Start one day before CT scan and on the day of take one in morning and one 1 hour before, Disp: 4 tablet, Rfl: 0 .  nebivolol (BYSTOLIC) 10 MG tablet, Take 1 tablet (10 mg total) by mouth daily. Take ont tab extra if SBP > 140 mm Hg, Disp: 30 tablet, Rfl: 2 .  olmesartan (BENICAR) 40 MG tablet, TAKE 1 TABLET(40 MG) BY MOUTH DAILY, Disp: 30 tablet, Rfl: 3 .  omega-3 acid ethyl esters (LOVAZA) 1 g capsule, Take 2 capsules (2 g total) by mouth 2 (two) times daily., Disp: 360  capsule, Rfl: 3 .  pantoprazole (PROTONIX) 40 MG tablet, Take 1 tablet (40 mg total) by mouth as directed. 1 tablet 30 minutes before breakfast for 2 weeks, followed by take as needed for heartburn, Disp: 30 tablet, Rfl: 1 .  rosuvastatin (CRESTOR) 20 MG tablet, Take 1 tablet (20 mg total) by mouth daily., Disp: 90 tablet, Rfl: 0  Laboratory examination:   Recent Labs    10/27/21 1216 11/01/21 0130 02/12/22 0929 04/27/22 1644 08/19/22 0005  NA 138 134* 142 140 140  K 3.8 3.5 4.3 4.2 4.0  CL 100 101 104 103 106  CO2 23 21* 22 25 25   GLUCOSE 128* 131* 100* 86 117*  BUN 21* 23* 22 22 19   CREATININE 1.47* 1.34* 1.26 1.36* 1.28*  CALCIUM 10.7* 9.5 9.8 10.2 9.9  GFRNONAA 58* >60  --   --  >60       Latest Ref Rng & Units 08/19/2022   12:05 AM 04/27/2022    4:44 PM 02/12/2022    9:29 AM  CMP  Glucose 70 - 99 mg/dL 117  86  100   BUN 6 - 20 mg/dL 19  22  22    Creatinine 0.61 - 1.24 mg/dL 1.28  1.36  1.26   Sodium 135 - 145 mmol/L 140  140  142   Potassium 3.5 - 5.1 mmol/L 4.0  4.2  4.3   Chloride 98 - 111 mmol/L 106  103  104   CO2 22 - 32 mmol/L 25  25  22    Calcium 8.9 - 10.3 mg/dL 9.9  10.2  9.8       Latest Ref Rng & Units 08/19/2022   12:05 AM 04/27/2022    4:44 PM 11/01/2021    1:30 AM  CBC  WBC 4.0 - 10.5 K/uL 8.4  9.8  12.3   Hemoglobin 13.0 - 17.0 g/dL 15.1  16.3  17.7   Hematocrit 39.0 - 52.0 % 43.2  45.8  50.1   Platelets 150 - 400 K/uL 188  194  199    Lipid Panel Recent Labs    02/12/22 0929  CHOL 149  TRIG 112  LDLCALC 92  HDL 36*  LDLDIRECT 91     HEMOGLOBIN A1C Lab Results  Component Value Date   HGBA1C 5.2 04/01/2021   TSH Recent Labs    04/27/22 1644  TSH 0.62    Radiology:   Chest x-ray two-view 11/01/2021: The heart size and mediastinal contours are within normal limits. Both lungs are clear. The visualized skeletal structures are unremarkable.  CT angiogram of head and neck 10/22/2021:  No large vessel occlusion, chronic  microvascular changes consistent with hypertension, aortic atherosclerosis.  Cardiac Studies:   Ambulatory cardiac telemetry 7 days (11/13/21 - 11/19/2021): Predominant underlying rhythm was sinus with rare PACs and PVCs.  Patient triggered events correlated with normal sinus rhythm.  Occasional PVC. No evidence of significant cardiac arrhythmias including SVT, A-fib, high degree AV block, pauses >3 seconds, or ventricular tachycardia.  PCV ECHOCARDIOGRAM COMPLETE 11/19/2021  Normal LV systolic function with visual EF 60-65%. Left ventricle cavity is normal in size. Mild left ventricular hypertrophy. Normal global wall motion. Normal diastolic filling pattern, normal LAP. Calculated EF 65%. No significant valvular heart disease. No prior study for comparison.  Coronary calcium score 12/15/2021: LM 0 LAD 42.6 LCx 43.2 RCA 43.2 Total Agatston score is 129, MESA database percentile 96. Ascending and descending aorta measured normal. Small solitary pulmonary nodule of the left lower lobe measuring 4 mm, no follow-up needed if low risk.  No other significant extracardiac abnormality.  PCV MYOCARDIAL PERFUSION WO LEXISCAN 01/13/2022  Narrative Exercise nuclear stress test 01/13/2022: Normal ECG stress. The patient exercised for 9 minutes and 25 seconds of a Bruce protocol, achieving approximately 10.85 METs and 87% of MPHR. No symptoms. The blood pressure response was normal. Negative for ischemia or inducible arrhythmias. Myocardial perfusion is normal. Overall LV systolic function is normal without regional wall motion abnormalities. Calculated Stress LV EF: 45% but visually normal. No previous exam available for comparison. Low risk.  Coronary CTA 09/08/2022: Coronary calcium score LM 0 LAD 45.8 LCx 59.8 RCA 43.  Total coronary calcium score 149, MESA database 96 percentile. Left Main: Patent.   LAD: Minimal stenosis (0-24%) due mixed plaque at proximal LAD, otherwise mid to distal  segment are patent. Ramus: Patent. LCX: Mild stenosis (25-49%) closer to 25% due to calcified plaque at proximal segment otherwise patent. RCA: Mild stenosis (25-49%) closer to 25% due to calcified plaque within proximal segment otherwise vessel is patent. 4. Scattered aortic atherosclerosis. 5. Small patent foramen ovale.  1. No significant extracardiac findings within the visualized lower chest. 2. Hepatic steatosis.      EKG:   EKG 08/25/2022: Normal sinus rhythm at the rate of 72 bpm, normal EKG.  Compared to 11/13/2021, no significant change.  Assessment     ICD-10-CM   1. Elevated coronary artery calcium score 12/15/2021: Total Agatston score is 129, MESA database percentile 96.  R93.1     2. Familial hypertriglyceridemia  E78.1     3. Primary hypertension  I10       There are no discontinued medications.   No orders of the defined types were placed in this encounter.  No orders of the defined types were placed in this encounter.  Recommendations:   Wayne Morales is a 52 y.o.  African-American male patient with hypertension, chronic palpitations, precordial pain, coronary calcium score  in 96 percentile in May 2023.  He also has familial hypertriglyceridemia.  He presented to the emergency room on 08/19/2022 with chest pressure across the upper chest and felt warm all over.   1. Elevated coronary artery calcium score 12/15/2021: Total Agatston score is 129, MESA database percentile 96. Patient is a Agricultural consultant by profession, in view of high risk profession, I also performed coronary CT angiogram  in view of high coronary calcium score and atypical chest pain, fortunately no significant CAD.  Patient needs primary prevention.  He is presently on high intensity statin.  He will be a good candidate for clinical trial to evaluate inclisiran as dictated below.  2.  Mixed hypercholesterolemia with hypertriglyceridemia   Patient is presently on high intensity statin.  He will need  lipid-lowering therapy.  As I will be seeing him back on a as needed basis, unless patient gets enrolled in [facility] clinical trial, I will request Dr. Scarlette Calico to follow-up on his lipids.  I will certainly keep him in the loop if he does indeed decide to participate in the clinical trial.  He is also on Lovaza and triglycerides have normalized.  3. Primary hypertension Blood pressure and excellent control.  Patient presently on Bystolic and also on ARB with olmesartan, continue the same.  4. Evaluated for clinical trial enrollment: [facility]" (Inclisiran 284 mg SQ every 6 months for aggressive LDL lowering in ACS Patient is willing to participate, we will look for inclusion criteria's.  His wife is present and all questions answered.  From cardiac standpoint no contraindication for him to resume all activities without any limitations.   Adrian Prows, MD, [facility] 10/06/2022, 9:55 AM Office: [phone]

## 2022-10-06 NOTE — Patient Instructions (Signed)
:  [facility]-INCEPTION" (Inclisiran 284 mg SQ every 6 months for aggressive LDL lowering in ACS

## 2022-11-25 ENCOUNTER — Other Ambulatory Visit (HOSPITAL_COMMUNITY): Payer: Self-pay

## 2022-11-25 ENCOUNTER — Other Ambulatory Visit: Payer: Self-pay | Admitting: Internal Medicine

## 2022-11-25 DIAGNOSIS — E785 Hyperlipidemia, unspecified: Secondary | ICD-10-CM

## 2022-11-25 MED ORDER — ROSUVASTATIN CALCIUM 20 MG PO TABS
20.0000 mg | ORAL_TABLET | Freq: Every day | ORAL | 0 refills | Status: DC
  Filled 2022-11-25 – 2022-11-26 (×2): qty 30, 30d supply, fill #0

## 2022-11-26 ENCOUNTER — Other Ambulatory Visit (HOSPITAL_COMMUNITY): Payer: Self-pay

## 2022-12-03 ENCOUNTER — Other Ambulatory Visit: Payer: Self-pay | Admitting: Cardiology

## 2022-12-03 DIAGNOSIS — R002 Palpitations: Secondary | ICD-10-CM

## 2022-12-03 DIAGNOSIS — I10 Essential (primary) hypertension: Secondary | ICD-10-CM

## 2023-01-03 ENCOUNTER — Other Ambulatory Visit: Payer: Self-pay | Admitting: Internal Medicine

## 2023-01-03 DIAGNOSIS — E785 Hyperlipidemia, unspecified: Secondary | ICD-10-CM

## 2023-01-24 ENCOUNTER — Ambulatory Visit: Payer: 59 | Admitting: Internal Medicine

## 2023-01-24 ENCOUNTER — Encounter: Payer: Self-pay | Admitting: Internal Medicine

## 2023-01-24 VITALS — BP 136/84 | HR 83 | Temp 98.0°F | Ht 67.0 in | Wt 205.0 lb

## 2023-01-24 DIAGNOSIS — E785 Hyperlipidemia, unspecified: Secondary | ICD-10-CM | POA: Diagnosis not present

## 2023-01-24 DIAGNOSIS — Z0001 Encounter for general adult medical examination with abnormal findings: Secondary | ICD-10-CM | POA: Diagnosis not present

## 2023-01-24 DIAGNOSIS — M21611 Bunion of right foot: Secondary | ICD-10-CM

## 2023-01-24 DIAGNOSIS — I10 Essential (primary) hypertension: Secondary | ICD-10-CM | POA: Diagnosis not present

## 2023-01-24 DIAGNOSIS — R739 Hyperglycemia, unspecified: Secondary | ICD-10-CM | POA: Diagnosis not present

## 2023-01-24 DIAGNOSIS — R931 Abnormal findings on diagnostic imaging of heart and coronary circulation: Secondary | ICD-10-CM | POA: Diagnosis not present

## 2023-01-24 DIAGNOSIS — G47429 Narcolepsy in conditions classified elsewhere without cataplexy: Secondary | ICD-10-CM

## 2023-01-24 DIAGNOSIS — Z0000 Encounter for general adult medical examination without abnormal findings: Secondary | ICD-10-CM

## 2023-01-24 DIAGNOSIS — M7711 Lateral epicondylitis, right elbow: Secondary | ICD-10-CM

## 2023-01-24 DIAGNOSIS — E781 Pure hyperglyceridemia: Secondary | ICD-10-CM

## 2023-01-24 LAB — HEPATIC FUNCTION PANEL
ALT: 42 U/L (ref 0–53)
AST: 32 U/L (ref 0–37)
Albumin: 4.7 g/dL (ref 3.5–5.2)
Alkaline Phosphatase: 73 U/L (ref 39–117)
Bilirubin, Direct: 0.1 mg/dL (ref 0.0–0.3)
Total Bilirubin: 0.6 mg/dL (ref 0.2–1.2)
Total Protein: 8.1 g/dL (ref 6.0–8.3)

## 2023-01-24 LAB — BASIC METABOLIC PANEL
BUN: 15 mg/dL (ref 6–23)
CO2: 28 mEq/L (ref 19–32)
Calcium: 10.1 mg/dL (ref 8.4–10.5)
Chloride: 104 mEq/L (ref 96–112)
Creatinine, Ser: 1.27 mg/dL (ref 0.40–1.50)
GFR: 65.3 mL/min (ref >60.00)
Glucose, Bld: 107 mg/dL — ABNORMAL HIGH (ref 70–99)
Potassium: 4.2 mEq/L (ref 3.5–5.1)
Sodium: 139 mEq/L (ref 135–145)

## 2023-01-24 LAB — LIPID PANEL
Cholesterol: 145 mg/dL (ref 0–200)
HDL: 30.6 mg/dL — ABNORMAL LOW (ref >39.00)
NonHDL: 114.88
Total CHOL/HDL Ratio: 5
Triglycerides: 208 mg/dL — ABNORMAL HIGH (ref 0.0–149.0)
VLDL: 41.6 mg/dL — ABNORMAL HIGH (ref 0.0–40.0)

## 2023-01-24 LAB — LDL CHOLESTEROL, DIRECT: Direct LDL: 55 mg/dL

## 2023-01-24 LAB — HEMOGLOBIN A1C: Hgb A1c MFr Bld: 5.2 % (ref 4.6–6.5)

## 2023-01-24 NOTE — Progress Notes (Signed)
Subjective:  Patient ID: Wayne Morales, male    DOB: 15-Dec-1970  Age: 52 y.o. MRN: [id number]  CC: Annual Exam, Hypertension, and Hyperlipidemia   HPI Wayne Morales presents for a CPX and f/up ---  Discussed the use of AI scribe software for clinical note transcription with the patient, who gave verbal consent to proceed.  History of Present Illness   The patient presents with a recent increase in daytime somnolence, despite reporting adequate night-time sleep. He reports falling asleep during periods of inactivity, with episodes of sleep occurring without his awareness. He denies any issues with falling asleep at night and reports feeling restored upon waking. However, he has been noted to snore more than usual, with episodes of cessation of snoring, but no observed apneas. He has not undergone any sleep studies in the past.  The patient also reports a recent increase in fatigue and an 8-pound weight gain. He has noticed a more prominent pulsation in his ear, which has not been further investigated. He denies any chest pain or shortness of breath with activity, attributing any limitations to age-related changes.  He has a history of PVCs diagnosed by a cardiologist, which he occasionally feels. Recently, he has developed pain in his right forearm, described as shooting pain extending from the elbow, particularly when shaking hands or lifting objects weighing more than five pounds. The pain is located on the outside of the forearm and has been present for about a week and a half. He denies any known injury. He does repetitive activity (bowling).  Additionally, he reports a shifting of his toes, which he believes is causing symptoms of restless leg syndrome, predominantly in the right lower extremity. These symptoms occur periodically throughout the day. He denies taking any medications for pain.       Outpatient Medications Prior to Visit  Medication Sig Dispense Refill    Ascorbic Acid (VITAMIN C PO) Take 1,000 mg by mouth daily at 6 (six) AM.     Calcium 500-2.5 MG-MCG CHEW Chew by mouth.     citalopram (CELEXA) 40 MG tablet Take 1 tablet (40 mg total) by mouth daily. 90 tablet 3   diltiazem (CARDIZEM CD) 240 MG 24 hr capsule Take 1 capsule by mouth once daily 90 capsule 0   ELDERBERRY PO Take by mouth.     hydrOXYzine (ATARAX) 25 MG tablet Take 1 tablet (25 mg total) by mouth 3 (three) times daily as needed. 60 tablet 2   nebivolol (BYSTOLIC) 10 MG tablet Take 1 tablet (10 mg total) by mouth daily. Take ont tab extra if SBP > 140 mm Hg 30 tablet 2   olmesartan (BENICAR) 40 MG tablet TAKE 1 TABLET(40 MG) BY MOUTH DAILY 30 tablet 3   omega-3 acid ethyl esters (LOVAZA) 1 g capsule Take 2 capsules (2 g total) by mouth 2 (two) times daily. 360 capsule 3   pantoprazole (PROTONIX) 40 MG tablet Take 1 tablet (40 mg total) by mouth as directed. 1 tablet 30 minutes before breakfast for 2 weeks, followed by take as needed for heartburn 30 tablet 1   rosuvastatin (CRESTOR) 20 MG tablet Take 1 tablet by mouth once daily 90 tablet 0   metoprolol tartrate (LOPRESSOR) 50 MG tablet Take 1 tablet (50 mg total) by mouth as directed for 2 days. Start one day before CT scan and on the day of take one in morning and one 1 hour before 4 tablet 0   No facility-administered medications prior  to visit.    ROS Review of Systems  Constitutional:  Positive for fatigue and unexpected weight change.  HENT:  Positive for tinnitus. Negative for hearing loss.   Eyes: Negative.  Negative for visual disturbance.  Respiratory: Negative.  Negative for cough, chest tightness, shortness of breath and wheezing.   Cardiovascular:  Negative for chest pain, palpitations and leg swelling.  Gastrointestinal:  Negative for abdominal pain, constipation, diarrhea, nausea and vomiting.  Genitourinary: Negative.  Negative for difficulty urinating, dysuria and hematuria.  Musculoskeletal:  Positive for  arthralgias. Negative for back pain, myalgias and neck pain.  Skin: Negative.  Negative for color change and pallor.  Neurological: Negative.  Negative for dizziness, tremors, light-headedness and numbness.  Hematological:  Negative for adenopathy. Does not bruise/bleed easily.  Psychiatric/Behavioral:  Positive for sleep disturbance. Negative for confusion, decreased concentration, dysphoric mood and suicidal ideas. The patient is not nervous/anxious.     Objective:  BP 136/84 (BP Location: Right Arm, Patient Position: Sitting, Cuff Size: Large)   Pulse 83   Temp 98 F (36.7 C) (Oral)   Ht 5\' 7"  (1.702 m)   Wt 205 lb (93 kg)   SpO2 95%   BMI 32.11 kg/m   BP Readings from Last 3 Encounters:  01/24/23 136/84  10/06/22 124/79  09/07/22 114/80    Wt Readings from Last 3 Encounters:  01/24/23 205 lb (93 kg)  10/06/22 197 lb 3.2 oz (89.4 kg)  09/07/22 205 lb (93 kg)    Physical Exam Vitals reviewed.  Constitutional:      Appearance: Normal appearance.  HENT:     Nose: Nose normal.     Mouth/Throat:     Mouth: Mucous membranes are moist.  Eyes:     General: No scleral icterus.    Conjunctiva/sclera: Conjunctivae normal.  Neck:     Vascular: No carotid bruit.  Cardiovascular:     Rate and Rhythm: Normal rate and regular rhythm.     Pulses: Normal pulses.     Heart sounds: No murmur heard.    No gallop.  Pulmonary:     Effort: Pulmonary effort is normal.     Breath sounds: No stridor. No wheezing, rhonchi or rales.  Abdominal:     General: Abdomen is flat.     Palpations: There is no mass.     Tenderness: There is no abdominal tenderness. There is no guarding.     Hernia: No hernia is present.  Musculoskeletal:        General: Normal range of motion.     Right elbow: No swelling or deformity. Normal range of motion. Tenderness present in lateral epicondyle.     Left elbow: No swelling or deformity. Normal range of motion. No tenderness.     Cervical back: Neck  supple.     Right lower leg: No edema.     Left lower leg: No edema.  Lymphadenopathy:     Cervical: No cervical adenopathy.  Skin:    General: Skin is warm and dry.  Neurological:     General: No focal deficit present.     Mental Status: He is alert. Mental status is at baseline.  Psychiatric:        Mood and Affect: Mood normal.        Behavior: Behavior normal.        Thought Content: Thought content normal.     Lab Results  Component Value Date   WBC 8.4 08/19/2022   HGB 15.1 08/19/2022  HCT 43.2 08/19/2022   PLT 188 08/19/2022   GLUCOSE 107 (H) 01/24/2023   CHOL 145 01/24/2023   TRIG 208.0 (H) 01/24/2023   HDL 30.60 (L) 01/24/2023   LDLDIRECT 55.0 01/24/2023   LDLCALC 92 02/12/2022   ALT 42 01/24/2023   AST 32 01/24/2023   NA 139 01/24/2023   K 4.2 01/24/2023   CL 104 01/24/2023   CREATININE 1.27 01/24/2023   BUN 15 01/24/2023   CO2 28 01/24/2023   TSH 0.62 04/27/2022   PSA 0.5 09/06/2022   INR 0.9 10/22/2021   HGBA1C 5.2 01/24/2023    CT CORONARY MORPH W/CTA COR W/SCORE W/CA W/CM &/OR WO/CM  Addendum Date: 09/11/2022   ADDENDUM REPORT: 09/11/2022 11:05 EXAM: OVER-READ INTERPRETATION  CT CHEST The following report is an over-read performed by radiologist Dr. Aram Candela of [facility] on 09/11/2022. This over-read does not include interpretation of cardiac or coronary anatomy or pathology. The coronary calcium score/coronary CTA interpretation by the cardiologist is attached. COMPARISON:  Dec 15, 2021 FINDINGS: Cardiovascular: There are no significant extracardiac vascular findings. Mediastinum/Nodes: There are no enlarged lymph nodes within the visualized mediastinum. Lungs/Pleura: There is no pleural effusion. The visualized lungs appear clear. Upper abdomen: There is diffuse fatty infiltration of the liver parenchyma. Musculoskeletal/Chest wall: No chest wall mass or suspicious osseous findings within the visualized chest. IMPRESSION: 1. No  significant extracardiac findings within the visualized lower chest. 2. Hepatic steatosis. Electronically Signed   By: Aram Candela M.D.   On: 09/11/2022 11:05   Result Date: 09/11/2022 HISTORY: Chest pain/anginal equiv, ECGs and troponins normal EXAM: Cardiac/Coronary  CT TECHNIQUE: The patient was scanned on a [employer]. PROTOCOL: A 120 kV prospective scan was triggered in the descending thoracic aorta at 111 HU's. Axial non-contrast 3 mm slices were carried out through the heart. The data set was analyzed on a dedicated work station and scored using the Agatson method. Gantry rotation speed was 250 msecs and collimation was .6 mm. No IV beta blockade but 0.8 mg of sl NTG was given. The 3D data set was reconstructed in 5% intervals of the 35-75 % of the R-R cycle. Diastolic phases were analyzed on a dedicated work station using MPR, MIP and VRT modes. The patient received OMNIPAQUE IOHEXOL 350 MG/ML SOLN of contrast. FINDINGS: Image quality: Excellent. Artifact: Limited. Coronary artery calcification score: Left main: 0 Left anterior descending artery: 45.8 Left circumflex artery: 59.8 Right coronary artery: 43 Total coronary calcium score of 149, places the patient at the 96th percentile for age and sex matched control. Coronary arteries: Normal coronary origins.  Right dominance. Left Main Coronary Artery: Normal caliber, originates from the left coronary cusp, trifurcates into a LAD, LCX, and ramus intermedius. There is no plaque or stenosis. Left Anterior Descending Coronary Artery: Normal caliber vessel, wraps the apex, gives off 2 patent diagonal branches. Minimal stenosis (0-24%) due mixed plaque at proximal LAD otherwise mid to distal segment are patent. Ramus intermedius: Patent with no evidence of plaque or stenosis. Left Circumflex Artery: Normal caliber vessel, non-dominant, travels within the atrioventricular groove, gives off 3 patent obtuse marginal branches. Mild stenosis  (25-49%) closer to 25% due to calcified plaque at proximal segment otherwise mid to distal segments are patent. Right Coronary Artery: Dominant vessel, originates from the right coronary cusp,terminates as a PDA and right posterolateral branch. Mild stenosis (25-49%) closer to 25% due to calcified plaque within proximal segment otherwise vessel is patent with minimal luminal irregularities. Left  Atrium: Grossly normal in size with no left atrial appendage filling defect. Small patent foramen ovale. Left Ventricle: Grossly normal in size. There are no stigmata of prior infarction. There is no abnormal filling defect. Pulmonary arteries: Normal in size without proximal filling defect. Pulmonary veins: Normal pulmonary venous drainage. Aorta: Normal size, 26.6 mm at the mid ascending aorta (level of the PA bifurcation) measured double oblique. Scattered aortic atherosclerosis. No dissection. Pericardium: Normal thickness with no significant effusion or calcium present. Cardiac valves: The aortic valve is trileaflet without significant calcification. The mitral valve is normal structure without significant calcification. Extra-cardiac findings: See attached radiology report for non-cardiac structures. IMPRESSION: 1. Total coronary calcium score of 149. This was 96th percentile for age and sex matched control. 2. Normal coronary origin with right dominance. 3. CAD-RADS = 2 Mild non-obstructive CAD. Left Main: Patent. LAD: Minimal stenosis (0-24%) due mixed plaque at proximal LAD otherwise mid to distal segment are patent. Ramus: Patent. LCX: Mild stenosis (25-49%) closer to 25% due to calcified plaque at proximal segment otherwise patent. RCA: Mild stenosis (25-49%) closer to 25% due to calcified plaque within proximal segment otherwise vessel is patent. 4. Scattered aortic atherosclerosis. 5. Small patent foramen ovale. RECOMMENDATIONS: Consider non-atherosclerotic causes of chest pain. Consider preventive therapy and  risk factor modification. Electronically Signed: By: Tessa Lerner D.O. On: 09/08/2022 18:20       Assessment & Plan:   Essential hypertension- His blood pressure is adequately well-controlled. -     Basic metabolic panel; Future -     Hepatic function panel; Future  Encounter for general adult medical examination with abnormal findings- Exam completed, labs reviewed, vaccines reviewed, cancer screenings are up-to-date, patient education was given.  Hyperlipidemia with target LDL less than 160 - LDL goal achieved. Doing well on the statin  -     Lipid panel; Future -     Lipoprotein A (LPA); Future -     Hepatic function panel; Future  Pure hyperglyceridemia -     Lipid panel; Future  Hyperglycemia -     Hepatic function panel; Future -     Hemoglobin A1c; Future  Elevated coronary artery calcium score 12/15/2021: Total Agatston score is 129, MESA database percentile 96. - LP(a) is low. -     Lipid panel; Future -     Lipoprotein A (LPA); Future  Narcolepsy due to underlying condition without cataplexy -     Ambulatory referral to Sleep Studies  Right tennis elbow -     Ambulatory referral to Occupational Therapy  Bunion, right foot -     Ambulatory referral to Podiatry  Other orders -     LDL cholesterol, direct     Follow-up: Return in about 6 months (around 07/27/2023).  Sanda Linger, MD

## 2023-01-24 NOTE — Patient Instructions (Signed)
Health Maintenance, Male Adopting a healthy lifestyle and getting preventive care are important in promoting health and wellness. Ask your health care provider about: The right schedule for you to have regular tests and exams. Things you can do on your own to prevent diseases and keep yourself healthy. What should I know about diet, weight, and exercise? Eat a healthy diet  Eat a diet that includes plenty of vegetables, fruits, low-fat dairy products, and lean protein. Do not eat a lot of foods that are high in solid fats, added sugars, or sodium. Maintain a healthy weight Body mass index (BMI) is a measurement that can be used to identify possible weight problems. It estimates body fat based on height and weight. Your health care provider can help determine your BMI and help you achieve or maintain a healthy weight. Get regular exercise Get regular exercise. This is one of the most important things you can do for your health. Most adults should: Exercise for at least 150 minutes each week. The exercise should increase your heart rate and make you sweat (moderate-intensity exercise). Do strengthening exercises at least twice a week. This is in addition to the moderate-intensity exercise. Spend less time sitting. Even light physical activity can be beneficial. Watch cholesterol and blood lipids Have your blood tested for lipids and cholesterol at 52 years of age, then have this test every 5 years. You may need to have your cholesterol levels checked more often if: Your lipid or cholesterol levels are high. You are older than 52 years of age. You are at high risk for heart disease. What should I know about cancer screening? Many types of cancers can be detected early and may often be prevented. Depending on your health history and family history, you may need to have cancer screening at various ages. This may include screening for: Colorectal cancer. Prostate cancer. Skin cancer. Lung  cancer. What should I know about heart disease, diabetes, and high blood pressure? Blood pressure and heart disease High blood pressure causes heart disease and increases the risk of stroke. This is more likely to develop in people who have high blood pressure readings or are overweight. Talk with your health care provider about your target blood pressure readings. Have your blood pressure checked: Every 3-5 years if you are 18-39 years of age. Every year if you are 40 years old or older. If you are between the ages of 65 and 75 and are a current or former smoker, ask your health care provider if you should have a one-time screening for abdominal aortic aneurysm (AAA). Diabetes Have regular diabetes screenings. This checks your fasting blood sugar level. Have the screening done: Once every three years after age 45 if you are at a normal weight and have a low risk for diabetes. More often and at a younger age if you are overweight or have a high risk for diabetes. What should I know about preventing infection? Hepatitis B If you have a higher risk for hepatitis B, you should be screened for this virus. Talk with your health care provider to find out if you are at risk for hepatitis B infection. Hepatitis C Blood testing is recommended for: Everyone born from 1945 through 1965. Anyone with known risk factors for hepatitis C. Sexually transmitted infections (STIs) You should be screened each year for STIs, including gonorrhea and chlamydia, if: You are sexually active and are younger than 52 years of age. You are older than 52 years of age and your   health care provider tells you that you are at risk for this type of infection. Your sexual activity has changed since you were last screened, and you are at increased risk for chlamydia or gonorrhea. Ask your health care provider if you are at risk. Ask your health care provider about whether you are at high risk for HIV. Your health care provider  may recommend a prescription medicine to help prevent HIV infection. If you choose to take medicine to prevent HIV, you should first get tested for HIV. You should then be tested every 3 months for as long as you are taking the medicine. Follow these instructions at home: Alcohol use Do not drink alcohol if your health care provider tells you not to drink. If you drink alcohol: Limit how much you have to 0-2 drinks a day. Know how much alcohol is in your drink. In the [place] one drink equals one 12 oz bottle of beer (355 mL), one 5 oz glass of wine (148 mL), or one 1 oz glass of hard liquor (44 mL). Lifestyle Do not use any products that contain nicotine or tobacco. These products include cigarettes, chewing tobacco, and vaping devices, such as e-cigarettes. If you need help quitting, ask your health care provider. Do not use street drugs. Do not share needles. Ask your health care provider for help if you need support or information about quitting drugs. General instructions Schedule regular health, dental, and eye exams. Stay current with your vaccines. Tell your health care provider if: You often feel depressed. You have ever been abused or do not feel safe at home. Summary Adopting a healthy lifestyle and getting preventive care are important in promoting health and wellness. Follow your health care provider's instructions about healthy diet, exercising, and getting tested or screened for diseases. Follow your health care provider's instructions on monitoring your cholesterol and blood pressure. This information is not intended to replace advice given to you by your health care provider. Make sure you discuss any questions you have with your health care provider. Document Revised: 11/24/2020 Document Reviewed: 11/24/2020 Elsevier Patient Education  2024 [employer]  

## 2023-01-27 LAB — LIPOPROTEIN A (LPA): Lipoprotein (a): 10 nmol/L (ref <75)

## 2023-02-03 ENCOUNTER — Encounter: Payer: Self-pay | Admitting: Cardiology

## 2023-02-03 ENCOUNTER — Ambulatory Visit: Payer: 59 | Admitting: Cardiology

## 2023-02-03 VITALS — BP 129/76 | HR 79 | Resp 16 | Ht 67.0 in | Wt 204.2 lb

## 2023-02-03 DIAGNOSIS — I10 Essential (primary) hypertension: Secondary | ICD-10-CM

## 2023-02-03 DIAGNOSIS — E782 Mixed hyperlipidemia: Secondary | ICD-10-CM

## 2023-02-03 DIAGNOSIS — R931 Abnormal findings on diagnostic imaging of heart and coronary circulation: Secondary | ICD-10-CM

## 2023-02-03 MED ORDER — ASPIRIN 81 MG PO CHEW
81.0000 mg | CHEWABLE_TABLET | Freq: Every day | ORAL | Status: DC
Start: 2023-02-03 — End: 2023-07-22

## 2023-02-03 NOTE — Progress Notes (Signed)
Primary Physician/Referring:  Etta Grandchild, MD  Patient ID: Wayne Morales, male    DOB: April 13, 1971, 52 y.o.   MRN: [id number]  Chief Complaint  Patient presents with   Coronary Artery Disease   Hyperlipidemia   Follow-up    1 year   HPI:    Wayne Morales  is a 52 y.o. African-American male patient with hypertension, chronic palpitations, precordial pain, coronary calcium score  in 96 percentile in May 2023.  He also has familial hypercholesterolemia.  Due to chest pain, elevated coronary calcium score, he underwent coronary CT angiogram and presents for follow-up.  Presently asymptomatic. He has made lifestyle changes with regard to his diet, given up most of the carbohydrates and also red meats.  He is now tolerating Lovaza 4 g/day and also Crestor.  Past Medical History:  Diagnosis Date   Atrial fibrillation (HCC)    Chronic back pain    Hyperlipidemia    diet controlled   Hypertension    on meds   Scalp psoriasis 12/15/2012   Past Surgical History:  Procedure Laterality Date   INGUINAL HERNIA REPAIR Left    as a child   Family History  Problem Relation Age of Onset   Hypertension Mother    Hyperlipidemia Mother    Supraventricular tachycardia Mother    Diabetes Father    Colon polyps Father 58   Colon cancer Father 77   Hypertension Sister    Brain cancer Brother    Heart disease Brother    Sarcoidosis Brother    Sarcoidosis Brother     Social History   Tobacco Use   Smoking status: Never   Smokeless tobacco: Never  Substance Use Topics   Alcohol use: Yes    Comment: socially   Marital Status: Married  ROS  Review of Systems  Cardiovascular:  Negative for chest pain, dyspnea on exertion, leg swelling and palpitations.   Objective  Blood pressure 129/76, pulse 79, resp. rate 16, height 5\' 7"  (1.702 m), weight 204 lb 3.2 oz (92.6 kg), SpO2 96%. Body mass index is 31.98 kg/m.     02/03/2023    2:21 PM 01/24/2023    8:36 AM 10/06/2022   10:38  AM  Vitals with BMI  Height 5\' 7"  5\' 7"  5\' 7"   Weight 204 lbs 3 oz 205 lbs 197 lbs 3 oz  BMI 31.97 32.1 30.88  Systolic 129 136 440  Diastolic 76 84 79  Pulse 79 83 83     Physical Exam Neck:     Vascular: No JVD.  Cardiovascular:     Rate and Rhythm: Normal rate and regular rhythm.     Pulses: Intact distal pulses.     Heart sounds: A midsystolic click. No murmur heard.    No gallop.  Pulmonary:     Effort: Pulmonary effort is normal.     Breath sounds: Normal breath sounds.  Abdominal:     General: Bowel sounds are normal.     Palpations: Abdomen is soft.  Musculoskeletal:     Right lower leg: No edema.     Left lower leg: No edema.    Laboratory examination:   Recent Labs    04/27/22 1644 08/19/22 0005 01/24/23 0907  NA 140 140 139  K 4.2 4.0 4.2  CL 103 106 104  CO2 25 25 28   GLUCOSE 86 117* 107*  BUN 22 19 15   CREATININE 1.36* 1.28* 1.27  CALCIUM 10.2 9.9 10.1  GFRNONAA  --  >  60  --       Latest Ref Rng & Units 01/24/2023    9:07 AM 08/19/2022   12:05 AM 04/27/2022    4:44 PM  CMP  Glucose 70 - 99 mg/dL 578  469  86   BUN 6 - 23 mg/dL 15  19  22    Creatinine 0.40 - 1.50 mg/dL 6.29  5.28  4.13   Sodium 135 - 145 mEq/L 139  140  140   Potassium 3.5 - 5.1 mEq/L 4.2  4.0  4.2   Chloride 96 - 112 mEq/L 104  106  103   CO2 19 - 32 mEq/L 28  25  25    Calcium 8.4 - 10.5 mg/dL 24.4  9.9  01.0   Total Protein 6.0 - 8.3 g/dL 8.1     Total Bilirubin 0.2 - 1.2 mg/dL 0.6     Alkaline Phos 39 - 117 U/L 73     AST 0 - 37 U/L 32     ALT 0 - 53 U/L 42         Latest Ref Rng & Units 08/19/2022   12:05 AM 04/27/2022    4:44 PM 11/01/2021    1:30 AM  CBC  WBC 4.0 - 10.5 K/uL 8.4  9.8  12.3   Hemoglobin 13.0 - 17.0 g/dL 27.2  53.6  64.4   Hematocrit 39.0 - 52.0 % 43.2  45.8  50.1   Platelets 150 - 400 K/uL 188  194  199    Lipid Panel Recent Labs    02/12/22 0929 01/24/23 0907  CHOL 149 145  TRIG 112 208.0*  LDLCALC 92  --   VLDL  --  41.6*  HDL 36* 30.60*   CHOLHDL  --  5  LDLDIRECT 91 55.0    HEMOGLOBIN A1C Lab Results  Component Value Date   HGBA1C 5.2 01/24/2023   TSH Recent Labs    04/27/22 1644  TSH 0.62   Radiology:   Chest x-ray two-view 11/01/2021: The heart size and mediastinal contours are within normal limits. Both lungs are clear. The visualized skeletal structures are unremarkable.  CT angiogram of head and neck 10/22/2021:  No large vessel occlusion, chronic microvascular changes consistent with hypertension, aortic atherosclerosis.  Cardiac Studies:   Ambulatory cardiac telemetry 7 days (11/13/21 - 11/19/2021): Predominant underlying rhythm was sinus with rare PACs and PVCs.  Patient triggered events correlated with normal sinus rhythm.  Occasional PVC. No evidence of significant cardiac arrhythmias including SVT, A-fib, high degree AV block, pauses >3 seconds, or ventricular tachycardia.  PCV ECHOCARDIOGRAM COMPLETE 11/19/2021  Normal LV systolic function with visual EF 60-65%. Left ventricle cavity is normal in size. Mild left ventricular hypertrophy. Normal global wall motion. Normal diastolic filling pattern, normal LAP. Calculated EF 65%. No significant valvular heart disease. No prior study for comparison.  Coronary calcium score 12/15/2021: LM 0 LAD 42.6 LCx 43.2 RCA 43.2 Total Agatston score is 129, MESA database percentile 96. Ascending and descending aorta measured normal. Small solitary pulmonary nodule of the left lower lobe measuring 4 mm, no follow-up needed if low risk.  No other significant extracardiac abnormality.  PCV MYOCARDIAL PERFUSION WO LEXISCAN 01/13/2022  Narrative Exercise nuclear stress test 01/13/2022: Normal ECG stress. The patient exercised for 9 minutes and 25 seconds of a Bruce protocol, achieving approximately 10.85 METs and 87% of MPHR. No symptoms. The blood pressure response was normal. Negative for ischemia or inducible arrhythmias. Myocardial perfusion is normal. Overall  LV systolic  function is normal without regional wall motion abnormalities. Calculated Stress LV EF: 45% but visually normal. No previous exam available for comparison. Low risk.  Coronary CTA 09/08/2022: Coronary calcium score LM 0 LAD 45.8 LCx 59.8 RCA 43.  Total coronary calcium score 149, MESA database 96 percentile. Left Main: Patent.   LAD: Minimal stenosis (0-24%) due mixed plaque at proximal LAD, otherwise mid to distal segment are patent. Ramus: Patent. LCX: Mild stenosis (25-49%) closer to 25% due to calcified plaque at proximal segment otherwise patent. RCA: Mild stenosis (25-49%) closer to 25% due to calcified plaque within proximal segment otherwise vessel is patent. 4. Scattered aortic atherosclerosis. 5. Small patent foramen ovale.  1. No significant extracardiac findings within the visualized lower chest. 2. Hepatic steatosis.      EKG:   EKG 02/03/2023: Normal sinus rhythm at the rate of 73 bpm, normal EKG.  Compared to 08/25/2022, no change.   Medications and allergies   Allergies  Allergen Reactions   Penicillins Hives and Rash    REACTION: Rash    Current Outpatient Medications:    Ascorbic Acid (VITAMIN C PO), Take 1,000 mg by mouth daily at 6 (six) AM., Disp: , Rfl:    aspirin (ASPIRIN CHILDRENS) 81 MG chewable tablet, Chew 1 tablet (81 mg total) by mouth daily., Disp: , Rfl:    Calcium 500-2.5 MG-MCG CHEW, Chew by mouth., Disp: , Rfl:    citalopram (CELEXA) 40 MG tablet, Take 1 tablet (40 mg total) by mouth daily., Disp: 90 tablet, Rfl: 3   diltiazem (CARDIZEM CD) 240 MG 24 hr capsule, Take 1 capsule by mouth once daily, Disp: 90 capsule, Rfl: 0   ELDERBERRY PO, Take by mouth., Disp: , Rfl:    hydrOXYzine (ATARAX) 25 MG tablet, Take 1 tablet (25 mg total) by mouth 3 (three) times daily as needed., Disp: 60 tablet, Rfl: 2   nebivolol (BYSTOLIC) 10 MG tablet, Take 1 tablet (10 mg total) by mouth daily. Take ont tab extra if SBP > 140 mm Hg, Disp: 30 tablet,  Rfl: 2   olmesartan (BENICAR) 40 MG tablet, TAKE 1 TABLET(40 MG) BY MOUTH DAILY, Disp: 30 tablet, Rfl: 3   omega-3 acid ethyl esters (LOVAZA) 1 g capsule, Take 2 capsules (2 g total) by mouth 2 (two) times daily., Disp: 360 capsule, Rfl: 3   pantoprazole (PROTONIX) 40 MG tablet, Take 1 tablet (40 mg total) by mouth as directed. 1 tablet 30 minutes before breakfast for 2 weeks, followed by take as needed for heartburn, Disp: 30 tablet, Rfl: 1   rosuvastatin (CRESTOR) 20 MG tablet, Take 1 tablet by mouth once daily, Disp: 90 tablet, Rfl: 0   Assessment     ICD-10-CM   1. Elevated coronary artery calcium score 12/15/2021: Total Agatston score is 129, MESA database percentile 96.  R93.1 aspirin (ASPIRIN CHILDRENS) 81 MG chewable tablet    2. Primary hypertension  I10 EKG 12-Lead    3. Mixed hypercholesterolemia and hypertriglyceridemia  E78.2       There are no discontinued medications.   Meds ordered this encounter  Medications   aspirin (ASPIRIN CHILDRENS) 81 MG chewable tablet    Sig: Chew 1 tablet (81 mg total) by mouth daily.   Orders Placed This Encounter  Procedures   EKG 12-Lead   Recommendations:   Amro Winebarger is a 52 y.o.  African-American male patient with hypertension, chronic palpitations, precordial pain, coronary calcium score  in 96 percentile in May 2023.  He also has  familial hypertriglyceridemia.  He presented to the emergency room on 08/19/2022 with chest pressure across the upper chest and felt warm all over.   1. Elevated coronary artery calcium score 12/15/2021: Total Agatston score is 129, MESA database percentile 96. Patient needs primary prevention.  He is presently on high intensity statin.  Reviewed his lipids, under excellent control.  According to the data, he still will benefit with being on aspirin at 81 mg daily.  2.  Mixed hypercholesterolemia with hypertriglyceridemia   Lipids reviewed, with a combination of rosuvastatin and also omega-3 fatty  acid, lipids are now normalized.  He is also made lifestyle changes.  I have specifically discussed with him regarding fatty liver as well.  3. Primary hypertension Blood pressure and excellent control.  Patient presently on Bystolic and also on ARB with olmesartan, continue the same.  As his risk factors are well-controlled, we will see him back on a as needed basis.  Yates Decamp, MD, [facility] 02/03/2023, 2:42 PM Office: [phone]

## 2023-02-04 ENCOUNTER — Ambulatory Visit: Payer: 59 | Admitting: Podiatry

## 2023-02-04 ENCOUNTER — Ambulatory Visit (INDEPENDENT_AMBULATORY_CARE_PROVIDER_SITE_OTHER): Payer: 59

## 2023-02-04 DIAGNOSIS — M21611 Bunion of right foot: Secondary | ICD-10-CM | POA: Diagnosis not present

## 2023-02-04 MED ORDER — EFINACONAZOLE 10 % EX SOLN: 1.0000 [drp] | Freq: Every day | CUTANEOUS | 11 refills | Status: DC

## 2023-02-04 NOTE — Progress Notes (Unsigned)
Subjective:   Patient ID: Wayne Morales, male   DOB: 52 y.o.   MRN: [id number]   HPI Chief Complaint  Patient presents with   Bunions    Pt is having lots of pain on his right foot due to bunion he states it feels like a aching pain sometimes causing his foot to cramp up and then runs all the way up to this calf. He says it happens out of no where theres nothing that triggers it specifically    52 year old male presents for above concerns.  The pain started in April and hurts with certain shoes.  No injuries.  No other concerns.  Review of Systems  All other systems reviewed and are negative.  Past Medical History:  Diagnosis Date   Atrial fibrillation (HCC)    Chronic back pain    Hyperlipidemia    diet controlled   Hypertension    on meds   Scalp psoriasis 12/15/2012    Past Surgical History:  Procedure Laterality Date   INGUINAL HERNIA REPAIR Left    as a child     Current Outpatient Medications:    Efinaconazole 10 % SOLN, Apply 1 drop topically daily., Disp: 4 mL, Rfl: 11   Ascorbic Acid (VITAMIN C PO), Take 1,000 mg by mouth daily at 6 (six) AM., Disp: , Rfl:    aspirin (ASPIRIN CHILDRENS) 81 MG chewable tablet, Chew 1 tablet (81 mg total) by mouth daily., Disp: , Rfl:    Calcium 500-2.5 MG-MCG CHEW, Chew by mouth., Disp: , Rfl:    citalopram (CELEXA) 40 MG tablet, Take 1 tablet (40 mg total) by mouth daily., Disp: 90 tablet, Rfl: 3   diltiazem (CARDIZEM CD) 240 MG 24 hr capsule, Take 1 capsule by mouth once daily, Disp: 90 capsule, Rfl: 0   ELDERBERRY PO, Take by mouth., Disp: , Rfl:    hydrOXYzine (ATARAX) 25 MG tablet, Take 1 tablet (25 mg total) by mouth 3 (three) times daily as needed., Disp: 60 tablet, Rfl: 2   nebivolol (BYSTOLIC) 10 MG tablet, Take 1 tablet (10 mg total) by mouth daily. Take ont tab extra if SBP > 140 mm Hg, Disp: 30 tablet, Rfl: 2   olmesartan (BENICAR) 40 MG tablet, TAKE 1 TABLET(40 MG) BY MOUTH DAILY, Disp: 30 tablet, Rfl: 3   omega-3  acid ethyl esters (LOVAZA) 1 g capsule, Take 2 capsules (2 g total) by mouth 2 (two) times daily., Disp: 360 capsule, Rfl: 3   rosuvastatin (CRESTOR) 20 MG tablet, Take 1 tablet by mouth once daily, Disp: 90 tablet, Rfl: 0  Allergies  Allergen Reactions   Penicillins Hives and Rash    REACTION: Rash           Objective:  Physical Exam  General: AAO x3, NAD  Dermatological: Skin is warm, dry and supple bilateral. There are no open sores, no preulcerative lesions, no rash or signs of infection present.  Vascular: Dorsalis Pedis artery and Posterior Tibial artery pedal pulses are 2/4 bilateral with immedate capillary fill time. There is no pain with calf compression, swelling, warmth, erythema.   Neruologic: Grossly intact via light touch bilateral.   Musculoskeletal: Moderate bunions present on the right foot with tenderness palpation surrounding the bunion area.  There is no area of pinpoint tenderness noted otherwise.  No crepitation with the first MTPJ range of motion.  Flexor, extensor tendons appear to be intact.  MMT 5/5.  Gait: Unassisted, Nonantalgic.       Assessment:  52 year old male bunion pain, capsulitis, cramping     Plan:  -Treatment options discussed including all alternatives, risks, and complications -Etiology of symptoms were discussed -X-rays were obtained and reviewed with the patient.  3 views of the right foot ordered today.  Notes of acute fracture.  Moderate to severe bunion deformity is noted.  No evidence of acute fracture. -Regards to the bunion discussed the modifications, wider toebox shoe as well as a softer material.  Discussed Voltaren gel.  Steroid injection if needed.  If symptoms persist consider surgical intervention -Discussed general stress exercises for his feet as well as good arch support.  Return if symptoms worsen or fail to improve.  Vivi Barrack DPM

## 2023-02-04 NOTE — Patient Instructions (Addendum)
If was nice to meet you today. If you have any questions or any further concerns, please feel fee to give me a call. You can call our office at [phone] or please feel fee to send me a message through MyChart.   ---  You an use voltaren gel which you can get over the counter to help as well ---   Plantar Fasciitis (Heel Spur Syndrome) with Rehab The plantar fascia is a fibrous, ligament-like, soft-tissue structure that spans the bottom of the foot. Plantar fasciitis is a condition that causes pain in the foot due to inflammation of the tissue. SYMPTOMS  Pain and tenderness on the underneath side of the foot. Pain that worsens with standing or walking. CAUSES  Plantar fasciitis is caused by irritation and injury to the plantar fascia on the underneath side of the foot. Common mechanisms of injury include: Direct trauma to bottom of the foot. Damage to a small nerve that runs under the foot where the main fascia attaches to the heel bone. Stress placed on the plantar fascia due to bone spurs. RISK INCREASES WITH:  Activities that place stress on the plantar fascia (running, jumping, pivoting, or cutting). Poor strength and flexibility. Improperly fitted shoes. Tight calf muscles. Flat feet. Failure to warm-up properly before activity. Obesity. PREVENTION Warm up and stretch properly before activity. Allow for adequate recovery between workouts. Maintain physical fitness: Strength, flexibility, and endurance. Cardiovascular fitness. Maintain a health body weight. Avoid stress on the plantar fascia. Wear properly fitted shoes, including arch supports for individuals who have flat feet.  PROGNOSIS  If treated properly, then the symptoms of plantar fasciitis usually resolve without surgery. However, occasionally surgery is necessary.  RELATED COMPLICATIONS  Recurrent symptoms that may result in a chronic condition. Problems of the lower back that are caused by compensating for  the injury, such as limping. Pain or weakness of the foot during push-off following surgery. Chronic inflammation, scarring, and partial or complete fascia tear, occurring more often from repeated injections.  TREATMENT  Treatment initially involves the use of ice and medication to help reduce pain and inflammation. The use of strengthening and stretching exercises may help reduce pain with activity, especially stretches of the Achilles tendon. These exercises may be performed at home or with a therapist. Your caregiver may recommend that you use heel cups of arch supports to help reduce stress on the plantar fascia. Occasionally, corticosteroid injections are given to reduce inflammation. If symptoms persist for greater than 6 months despite non-surgical (conservative), then surgery may be recommended.   MEDICATION  If pain medication is necessary, then nonsteroidal anti-inflammatory medications, such as aspirin and ibuprofen, or other minor pain relievers, such as acetaminophen, are often recommended. Do not take pain medication within 7 days before surgery. Prescription pain relievers may be given if deemed necessary by your caregiver. Use only as directed and only as much as you need. Corticosteroid injections may be given by your caregiver. These injections should be reserved for the most serious cases, because they may only be given a certain number of times.  HEAT AND COLD Cold treatment (icing) relieves pain and reduces inflammation. Cold treatment should be applied for 10 to 15 minutes every 2 to 3 hours for inflammation and pain and immediately after any activity that aggravates your symptoms. Use ice packs or massage the area with a piece of ice (ice massage). Heat treatment may be used prior to performing the stretching and strengthening activities prescribed by your caregiver, physical  therapist, or athletic trainer. Use a heat pack or soak the injury in warm water.  SEEK IMMEDIATE  MEDICAL CARE IF: Treatment seems to offer no benefit, or the condition worsens. Any medications produce adverse side effects.  EXERCISES- RANGE OF MOTION (ROM) AND STRETCHING EXERCISES - Plantar Fasciitis (Heel Spur Syndrome) These exercises may help you when beginning to rehabilitate your injury. Your symptoms may resolve with or without further involvement from your physician, physical therapist or athletic trainer. While completing these exercises, remember:  Restoring tissue flexibility helps normal motion to return to the joints. This allows healthier, less painful movement and activity. An effective stretch should be held for at least 30 seconds. A stretch should never be painful. You should only feel a gentle lengthening or release in the stretched tissue.  RANGE OF MOTION - Toe Extension, Flexion Sit with your right / left leg crossed over your opposite knee. Grasp your toes and gently pull them back toward the top of your foot. You should feel a stretch on the bottom of your toes and/or foot. Hold this stretch for 10 seconds. Now, gently pull your toes toward the bottom of your foot. You should feel a stretch on the top of your toes and or foot. Hold this stretch for 10 seconds. Repeat  times. Complete this stretch 3 times per day.   RANGE OF MOTION - Ankle Dorsiflexion, Active Assisted Remove shoes and sit on a chair that is preferably not on a carpeted surface. Place right / left foot under knee. Extend your opposite leg for support. Keeping your heel down, slide your right / left foot back toward the chair until you feel a stretch at your ankle or calf. If you do not feel a stretch, slide your bottom forward to the edge of the chair, while still keeping your heel down. Hold this stretch for 10 seconds. Repeat 3 times. Complete this stretch 2 times per day.   STRETCH  Gastroc, Standing Place hands on wall. Extend right / left leg, keeping the front knee somewhat bent. Slightly  point your toes inward on your back foot. Keeping your right / left heel on the floor and your knee straight, shift your weight toward the wall, not allowing your back to arch. You should feel a gentle stretch in the right / left calf. Hold this position for 10 seconds. Repeat 3 times. Complete this stretch 2 times per day.  STRETCH  Soleus, Standing Place hands on wall. Extend right / left leg, keeping the other knee somewhat bent. Slightly point your toes inward on your back foot. Keep your right / left heel on the floor, bend your back knee, and slightly shift your weight over the back leg so that you feel a gentle stretch deep in your back calf. Hold this position for 10 seconds. Repeat 3 times. Complete this stretch 2 times per day.  STRETCH  Gastrocsoleus, Standing  Note: This exercise can place a lot of stress on your foot and ankle. Please complete this exercise only if specifically instructed by your caregiver.  Place the ball of your right / left foot on a step, keeping your other foot firmly on the same step. Hold on to the wall or a rail for balance. Slowly lift your other foot, allowing your body weight to press your heel down over the edge of the step. You should feel a stretch in your right / left calf. Hold this position for 10 seconds. Repeat this exercise with a slight  bend in your right / left knee. Repeat 3 times. Complete this stretch 2 times per day.   STRENGTHENING EXERCISES - Plantar Fasciitis (Heel Spur Syndrome)  These exercises may help you when beginning to rehabilitate your injury. They may resolve your symptoms with or without further involvement from your physician, physical therapist or athletic trainer. While completing these exercises, remember:  Muscles can gain both the endurance and the strength needed for everyday activities through controlled exercises. Complete these exercises as instructed by your physician, physical therapist or athletic trainer.  Progress the resistance and repetitions only as guided.  STRENGTH - Towel Curls Sit in a chair positioned on a non-carpeted surface. Place your foot on a towel, keeping your heel on the floor. Pull the towel toward your heel by only curling your toes. Keep your heel on the floor. Repeat 3 times. Complete this exercise 2 times per day.  STRENGTH - Ankle Inversion Secure one end of a rubber exercise band/tubing to a fixed object (table, pole). Loop the other end around your foot just before your toes. Place your fists between your knees. This will focus your strengthening at your ankle. Slowly, pull your big toe up and in, making sure the band/tubing is positioned to resist the entire motion. Hold this position for 10 seconds. Have your muscles resist the band/tubing as it slowly pulls your foot back to the starting position. Repeat 3 times. Complete this exercises 2 times per day.  Document Released: 07/05/2005 Document Revised: 09/27/2011 Document Reviewed: 10/17/2008 Exit[employer] Patient Information 2014 [employer], [place].  ---   Bunion A bunion (hallux valgus) is a bump that forms slowly on the inner side of the big toe joint. It occurs when the big toe turns toward the second toe. Bunions may be small at first, but they often get larger over time. They can make walking painful. What are the causes? This condition may be caused by: Wearing narrow or pointed shoes that force the big toe to press against the other toes. Abnormal foot development that causes the foot to roll inward. Changes in the foot that are caused by certain diseases, such as rheumatoid arthritis or polio. A foot injury. What increases the risk? The following factors may make you more likely to develop this condition: Wearing shoes that squeeze the toes together. Having certain diseases, such as: Rheumatoid arthritis. Polio. Cerebral palsy. Having family members who have bunions. Being born with abnormally shaped  feet (a foot deformity), such as flat feet or low arches. Doing activities that put a lot of pressure on the feet, such as ballet dancing. What are the signs or symptoms?  The main symptom of this condition is a bump on your big toe that you can notice. Other symptoms may include: Pain. Redness and inflammation around your big toe. Thick or hardened skin on your big toe or between your toes. Stiffness or loss of motion in your big toe. Trouble with walking. How is this diagnosed? This condition may be diagnosed based on your symptoms, medical history, and activities. You may also have tests and imaging, such as: X-rays. These allow your health care provider to check the position of the bones in your foot and look for damage to your joint. They also help your health care provider determine the severity of your bunion and the best way to treat it. Joint aspiration. In this test, a sample of fluid is removed from the toe joint. This test may be done if you are in a  lot of pain. It helps rule out diseases that cause painful swelling of the joints, such as arthritis or gout. How is this treated? Treatment depends on the severity of your symptoms. The goal of treatment is to relieve symptoms and prevent your bunion from getting worse. Your health care provider may recommend: Wearing shoes that have a wide toe box, or using bunion pads to cushion the affected area. Taping your toes together to keep them in a normal position. Placing a device inside your shoe (orthotic device) to help reduce pressure on your toe joint. Taking medicine to ease pain and inflammation. Putting ice or heat on the affected area. Doing stretching exercises. Surgery, for severe cases. Follow these instructions at home: Managing pain, stiffness, and swelling     If directed, put ice on the painful area. To do this: Put ice in a plastic bag. Place a towel between your skin and the bag. Leave the ice on for 20 minutes,  2-3 times a day. Remove the ice if your skin turns bright red. This is very important. If you cannot feel pain, heat, or cold, you have a greater risk of damage to the area. If directed, apply heat to the affected area before you exercise. Use the heat source that your health care provider recommends, such as a moist heat pack or a heating pad. Place a towel between your skin and the heat source. Leave the heat on for 20-30 minutes. Remove the heat if your skin turns bright red. This is especially important if you are unable to feel pain, heat, or cold. You have a greater risk of getting burned. General instructions Do exercises as told by your health care provider. Support your toe joint with proper footwear, shoe padding, or taping as told by your health care provider. Take over-the-counter and prescription medicines only as told by your health care provider. Do not use any products that contain nicotine or tobacco, such as cigarettes, e-cigarettes, and chewing tobacco. If you need help quitting, ask your health care provider. Keep all follow-up visits. This is important. Contact a health care provider if: Your symptoms get worse. Your symptoms do not improve in 2 weeks. Get help right away if: You have severe pain and trouble with walking. Summary A bunion is a bump on the inner side of the big toe joint that forms when the big toe turns toward the second toe. Bunions can make walking painful. Treatment depends on the severity of your symptoms. Support your toe joint with proper footwear, shoe padding, or taping as told by your health care provider. This information is not intended to replace advice given to you by your health care provider. Make sure you discuss any questions you have with your health care provider. Document Revised: 11/08/2019 Document Reviewed: 11/09/2019 Elsevier Patient Education  2024 [employer].

## 2023-02-15 ENCOUNTER — Other Ambulatory Visit: Payer: Self-pay | Admitting: Podiatry

## 2023-02-15 DIAGNOSIS — M21611 Bunion of right foot: Secondary | ICD-10-CM

## 2023-02-16 ENCOUNTER — Other Ambulatory Visit: Payer: Self-pay

## 2023-02-16 ENCOUNTER — Ambulatory Visit: Payer: 59 | Attending: Internal Medicine | Admitting: Occupational Therapy

## 2023-02-16 DIAGNOSIS — M6281 Muscle weakness (generalized): Secondary | ICD-10-CM | POA: Insufficient documentation

## 2023-02-16 DIAGNOSIS — M7711 Lateral epicondylitis, right elbow: Secondary | ICD-10-CM | POA: Diagnosis present

## 2023-02-16 DIAGNOSIS — M25521 Pain in right elbow: Secondary | ICD-10-CM | POA: Insufficient documentation

## 2023-02-16 NOTE — Therapy (Unsigned)
OUTPATIENT OCCUPATIONAL THERAPY ORTHO EVALUATION  Patient Name: Wayne Morales MRN: [id number] DOB:1971/04/09, 52 y.o., male Today's Date: 02/16/2023  PCP: Etta Grandchild, MD REFERRING PROVIDER: Etta Grandchild, MD  END OF SESSION:  OT End of Session - 02/16/23 1221     Visit Number 1    Number of Visits 12   + evaluation   Date for OT Re-Evaluation 04/08/23    Authorization Type [employer] 2024 VL: 60 combined w/ PT&ST Auth Not Reqd    OT Start Time 1230    OT Stop Time 1315    OT Time Calculation (min) 45 min    Equipment Utilized During Treatment Testing Material    Activity Tolerance Patient tolerated treatment well    Behavior During Therapy [facility] for tasks assessed/performed             Past Medical History:  Diagnosis Date   Atrial fibrillation (HCC)    Chronic back pain    Hyperlipidemia    diet controlled   Hypertension    on meds   Scalp psoriasis 12/15/2012   Past Surgical History:  Procedure Laterality Date   INGUINAL HERNIA REPAIR Left    as a child   Patient Active Problem List   Diagnosis Date Noted   Hyperglycemia 01/24/2023   Narcolepsy due to underlying 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 09/08/2022   Panic anxiety syndrome 05/19/2022   GERD (gastroesophageal reflux disease) 05/19/2022   GAD (generalized anxiety disorder) 05/17/2022   Drug-induced erectile dysfunction 06/02/2021   Encounter for general adult medical examination with abnormal findings 04/01/2021   Vitamin D deficiency disease 04/01/2021   Colon cancer screening 04/01/2021   Pure hyperglyceridemia 09/09/2017   Routine general medical examination at a health care facility 09/07/2017   Onychomycosis of great toe 09/07/2017   Protrusion of lumbar intervertebral disc    Essential hypertension 07/08/2014    Scalp psoriasis 12/15/2012   Hyperlipidemia with target LDL less than 160 07/08/2008    ONSET DATE: 01/24/2023  REFERRING DIAG: M77.11 (ICD-10-CM) - Right tennis elbow  THERAPY DIAG:  Pain in right elbow  Muscle weakness (generalized)  Lateral epicondylitis of right elbow  Rationale for Evaluation and Treatment: Rehabilitation  SUBJECTIVE:   SUBJECTIVE STATEMENT: Started bowling a few weeks before the onset of the pain and had to stop due to the pain with picking up the bowling ball (14 lb). Woke up one day and pain in palm that goes up to my elbow- hard to shake someone's hand as it hurts, certain positions ie) resting the elbow on the table shoots pain, lifting things ie) curl (more than a couple of pounds makes it hurt). Pt accompanied by: self  PERTINENT HISTORY:  Medical Record Review: Recently, he has developed pain in his right forearm, described as shooting pain extending from the elbow, particularly when shaking hands or lifting objects weighing more than five pounds. The pain is located on the outside of the forearm and has been present for about a week and a half. He denies any known injury. He does repetitive activity (bowling).  PRECAUTIONS: None  RED FLAGS: None   WEIGHT BEARING RESTRICTIONS: No  PAIN:  Are you having pain? Not at rest but with use; resting on table increased arm to 2/10, Just moving arm (flexing it without item makes it hurt).  Yes:  NPRS scale: 10/10 Pain location: Ulnar aspect of elbow and backside of thumb - with flexion of elbow and shaking someone's hand Pain description: sharp pains in the elbow Aggravating factors: using arm Relieving factors: not using  Can carry briefcase with elbow extended. The coffee cup test - feels it at elbow but is very mild  FALLS: Has patient fallen in last 6 months? Yes. 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  PATIENT GOALS: Get rid of the pain and increase strength  NEXT MD VISIT: NA  OBJECTIVE:   HAND DOMINANCE: Right  ADLs: Overall ADLs: Independent  FUNCTIONAL OUTCOME MEASURES: Upper Extremity Functional Scale (UEFS): 65  Difficulty noted with following activities: carrying objects including briefcase/computer bag Participating in usual hobbies, recreational or sporting activities. Lifting a bag of groceries to waist level/ above head.  UPPER EXTREMITY ROM:     Active ROM Right eval Left eval  Shoulder flexion    Shoulder abduction    Shoulder adduction    Shoulder extension    Shoulder internal rotation    Shoulder external rotation    Elbow flexion    Elbow extension    Wrist flexion Discomofrt on back of hand   Wrist extension    Wrist ulnar deviation    Wrist radial deviation    Wrist pronation    Wrist supination    (Blank rows = not tested)  Active ROM Right eval Left eval  Thumb MCP (0-60)    Thumb IP (0-80)    Thumb Radial abd/add (0-55)     Thumb Palmar abd/add (0-45)     Thumb Opposition to Small Finger     Index MCP (0-90)     Index PIP (0-100)     Index DIP (0-70)      Long MCP (0-90)      Long PIP (0-100)      Long DIP (0-70)      Ring MCP (0-90)      Ring PIP (0-100)      Ring DIP (0-70)      Little MCP (0-90)      Little PIP (0-100)      Little DIP (0-70)      (Blank rows = not tested)   UPPER EXTREMITY MMT:     MMT Right eval Left eval  Shoulder flexion 5/5 5/5  Shoulder abduction 5/5 5/5  Shoulder adduction    Shoulder extension    Shoulder internal rotation    Shoulder external rotation    Middle trapezius    Lower trapezius    Elbow flexion 4-/5 - stops quickly  5/5  Elbow extension 4+/5   Wrist  flexion    Wrist extension    Wrist ulnar deviation    Wrist radial deviation    Wrist pronation    Wrist supination    (Blank rows = not tested)  HAND FUNCTION: Grip strength: Right: 47.6, 54.0, 65.0  lbs; Left: 102.7, 109.7, 106.9  lbs, Lateral pinch: Right: 32 lbs, Left: 32 lbs, 3 point pinch: Right: 20 lbs, Left: 20,  lbs, and Tip pinch: Right 17 lbs, Left: 18 lbs Tripod pinch - no pain,   COORDINATION: NT  SENSATION: WFL  EDEMA: NA  COGNITION: Overall cognitive status: Within functional limits for tasks assessed  OBSERVATIONS: Palable tenderness lateral epicondyle   TODAY'S TREATMENT:                                                                                                                              DATE: 02/16/23   Introduced Lateral Epicondylitis  PATIENT EDUCATION: Education details: OT POC Person educated: {Person educated:25204} Education method: {Education Method:25205} Education comprehension: {Education Comprehension:25206}  HOME EXERCISE PROGRAM: ***  GOALS: Goals reviewed with patient? {yes/no:20286}  SHORT TERM GOALS: Target date: ***  HEP Baseline: Goal status: INITIAL  2.  Increase strength Baseline:  Goal status: INITIAL  3.  Decrease pain Baseline:  Goal status: INITIAL  4.  Resume bowling Baseline:  Goal status: INITIAL  5.  *** Baseline:  Goal status: INITIAL  6.  *** Baseline:  Goal status: INITIAL  LONG TERM GOALS: Target date: ***  *** Baseline:  Goal status: INITIAL  2.  *** Baseline:  Goal status: INITIAL  3.  *** Baseline:  Goal status: INITIAL  4.  *** Baseline:  Goal status: INITIAL  5.  *** Baseline:  Goal status: INITIAL  6.  *** Baseline:  Goal status: INITIAL  ASSESSMENT:  CLINICAL IMPRESSION: Patient is a 52 y.o. male who was seen today for occupational therapy evaluation for tennis elbow. Hx includes a fib, chronic back pain, HTN and recently, he developed pain in his right  forearm, described as shooting pain extending from the elbow, particularly when shaking hands or lifting objects weighing more than five pounds. The pain is located on the outside of the forearm and has been present for nearly a month. He denies any known injury but had recently resumed bowling.  Patient currently presents below baseline level of function especially for grip strength and Pt will benefit from skilled OT services in the outpatient setting to work on impairments as noted below.     PERFORMANCE DEFICITS: in functional skills including IADLs, strength, pain, Gross motor control, and UE functional use.  IMPAIRMENTS: are limiting patient from IADLs and leisure.   COMORBIDITIES: may have co-morbidities  that affects occupational performance. Patient will benefit from skilled OT to address above impairments and improve overall function.  MODIFICATION OR ASSISTANCE TO COMPLETE EVALUATION: No modification of tasks or assist necessary to complete an evaluation.  OT OCCUPATIONAL PROFILE AND HISTORY: Problem focused assessment: Including review of records relating to presenting problem.  CLINICAL DECISION MAKING: LOW - limited treatment options, no task modification necessary  REHAB POTENTIAL: Excellent  EVALUATION COMPLEXITY: Low      PLAN:  OT FREQUENCY: 1-2x/week  OT DURATION: 6 weeks  PLANNED INTERVENTIONS: therapeutic exercise, therapeutic activity, manual therapy, electrical stimulation, ultrasound, iontophoresis, coping strategies training, and DME and/or AE instructions  RECOMMENDED OTHER SERVICES: NA  CONSULTED AND AGREED WITH PLAN OF CARE: Patient  PLAN FOR NEXT SESSION: ***   Victorino Sparrow, OT 02/16/2023, 5:28 PM

## 2023-02-26 ENCOUNTER — Other Ambulatory Visit: Payer: Self-pay | Admitting: Cardiology

## 2023-02-26 DIAGNOSIS — E781 Pure hyperglyceridemia: Secondary | ICD-10-CM

## 2023-02-26 DIAGNOSIS — R931 Abnormal findings on diagnostic imaging of heart and coronary circulation: Secondary | ICD-10-CM

## 2023-03-01 ENCOUNTER — Encounter: Payer: Self-pay | Admitting: Cardiology

## 2023-03-01 ENCOUNTER — Ambulatory Visit: Payer: 59 | Attending: Internal Medicine | Admitting: Occupational Therapy

## 2023-03-01 DIAGNOSIS — M25521 Pain in right elbow: Secondary | ICD-10-CM | POA: Diagnosis present

## 2023-03-01 DIAGNOSIS — M6281 Muscle weakness (generalized): Secondary | ICD-10-CM | POA: Diagnosis present

## 2023-03-01 DIAGNOSIS — M7711 Lateral epicondylitis, right elbow: Secondary | ICD-10-CM | POA: Diagnosis present

## 2023-03-01 DIAGNOSIS — M7701 Medial epicondylitis, right elbow: Secondary | ICD-10-CM | POA: Insufficient documentation

## 2023-03-01 NOTE — Telephone Encounter (Signed)
From patient.

## 2023-03-01 NOTE — Patient Instructions (Signed)
  Lateral Epicondylitis Education - stretches and strengthening  [email or web address]://orthoinfo.aaos.org/globalassets/pdfs/[email or web address]-therapeutic-exercise-program-[email or web address]-[email or web address].pdf

## 2023-03-01 NOTE — Therapy (Signed)
OUTPATIENT OCCUPATIONAL THERAPY ORTHO TREATMENT  Patient Name: Wayne Morales MRN: [id number] DOB:11/19/1970, 52 y.o., male Today's Date: 03/01/2023  PCP: Etta Grandchild, MD REFERRING PROVIDER: Etta Grandchild, MD  END OF SESSION:  OT End of Session - 03/01/23 1302     Visit Number 2    Number of Visits 12   + evaluation   Date for OT Re-Evaluation 04/08/23    Authorization Type [employer] 2024 VL: 60 combined w/ PT&ST Auth Not Reqd    OT Start Time 1314    OT Stop Time 1359    OT Time Calculation (min) 45 min    Equipment Utilized During Treatment ultrasound, free weights    Activity Tolerance Patient tolerated treatment well    Behavior During Therapy [facility] for tasks assessed/performed             Past Medical History:  Diagnosis Date   Atrial fibrillation (HCC)    Chronic back pain    Hyperlipidemia    diet controlled   Hypertension    on meds   Scalp psoriasis 12/15/2012   Past Surgical History:  Procedure Laterality Date   INGUINAL HERNIA REPAIR Left    as a child   Patient Active Problem List   Diagnosis Date Noted   Hyperglycemia 01/24/2023   Narcolepsy due to underlying 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 09/08/2022   Panic anxiety syndrome 05/19/2022   GERD (gastroesophageal reflux disease) 05/19/2022   GAD (generalized anxiety disorder) 05/17/2022   Drug-induced erectile dysfunction 06/02/2021   Encounter for general adult medical examination with abnormal findings 04/01/2021   Vitamin D deficiency disease 04/01/2021   Colon cancer screening 04/01/2021   Pure hyperglyceridemia 09/09/2017   Routine general medical examination at a health care facility 09/07/2017   Onychomycosis of great toe 09/07/2017   Protrusion of lumbar intervertebral disc    Essential hypertension  07/08/2014   Scalp psoriasis 12/15/2012   Hyperlipidemia with target LDL less than 160 07/08/2008    ONSET DATE: 01/24/2023  REFERRING DIAG: M77.11 (ICD-10-CM) - Right tennis elbow  THERAPY DIAG:  Pain in right elbow  Lateral epicondylitis of right elbow  Rationale for Evaluation and Treatment: Rehabilitation  SUBJECTIVE:   SUBJECTIVE STATEMENT: Patient reports that his worst pain is about 8/10 but upon arrival he was not in pain at rest, just when he tries to use it for certain things.   Pt accompanied by: self  PERTINENT HISTORY:  Medical Record Review: Recently, he has developed pain in his right forearm, described as shooting pain extending from the elbow, particularly when shaking hands or lifting objects weighing more than five pounds. The pain is located on the outside of the forearm and has been present for about a week and a half. He denies any known injury. He does repetitive activity (bowling).  PRECAUTIONS: None  RED FLAGS: None   WEIGHT BEARING RESTRICTIONS: No  PAIN:  Are you having pain? Not at rest but with use (0/10) but resting on table increases discomfort up his arm to 2/10, even just moving arm (flexing it without item makes it hurt) Pain can get up to 8/10 with use.  Yes: NPRS scale: 8/10 Pain location: Ulnar aspect of elbow and backside of thumb Pain description: sharp pains in the elbow Aggravating factors: using arm, with flexion of elbow and shaking someone's  hand Relieving factors: not using   The coffee cup test - feels it at elbow but is very mild Can carry briefcase with elbow extended.  FALLS: Has patient fallen in last 6 months? Yes. 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  PATIENT GOALS: Get rid of the pain and increase strength.  NEXT MD VISIT: Not Scheduled  OBJECTIVE:   HAND DOMINANCE: Right  ADLs: Overall ADLs: Independent  FUNCTIONAL OUTCOME MEASURES: Upper Extremity Functional Scale (UEFS): 65  Difficulty noted with following activities: carrying objects including briefcase/computer bag Participating in usual hobbies, recreational or sporting activities (ie bowling). Lifting a bag of groceries to waist level/ above head.  UPPER EXTREMITY ROM:     Active ROM - WNL throughout Right eval Left eval  Shoulder flexion    Shoulder abduction    Shoulder adduction    Shoulder extension    Shoulder internal rotation    Shoulder external rotation    Elbow flexion    Elbow extension    Wrist flexion Discomfort on back of hand   Wrist extension    Wrist ulnar deviation    Wrist radial deviation    Wrist pronation    Wrist supination    (Blank rows = not tested)   UPPER EXTREMITY MMT:     MMT Right eval Left eval  Shoulder flexion 5/5 5/5  Shoulder abduction 5/5 5/5  Shoulder adduction    Shoulder extension    Shoulder internal rotation    Shoulder external rotation    Middle trapezius    Lower trapezius    Elbow flexion 4-/5 - stops quickly  5/5  Elbow extension 4+/5 5/5  Wrist flexion    Wrist extension    Wrist ulnar deviation    Wrist radial deviation    Wrist pronation    Wrist supination    (Blank rows = not tested)  HAND FUNCTION: Grip strength: Right: 47.6, 54.0, 65.0  lbs; Left: 102.7, 109.7, 106.9  lbs, Lateral pinch: Right: 32 lbs, Left: 32 lbs, 3 point pinch: Right: 20 lbs, Left: 20,  lbs, and Tip pinch: Right 17 lbs, Left: 18 lbs  Grip Strength Average: Right: 55.5 lbs Left: 106.4 lbs  Pinching motions do not cause pain but gripping does.  COORDINATION: NT  SENSATION: WFL  EDEMA: NA  COGNITION: Overall  cognitive status: Within functional limits for tasks assessed  OBSERVATIONS at Eval: Pt is a fit gentleman with no balance deficits or obvious coordination impairments who presents with palpable tenderness at the lateral epicondyle and obvious UE strength deficits of his dominant R UE compared to LUE.   TODAY'S TREATMENT:  Ultrasound: - Ultrasound completed for duration as noted below including:  Ultrasound applied to  right elbow for 10 minutes, frequency of 3 MHz, 100% duty cycle, and 1.1 W/cm with pt's arm placed on soft towel for promotion of ROM, edema reduction, and pain reduction in affected RUE extremity.   Therapeutic Exercises:   Reviewed Lateral Epicondylitis Conservative treatment Management for Manual Massage of surrounding areas of elbow, and forearm stretches with addition of Lateral Epicondylitis Exercises   Reviewed excises as list below with small hand weight - trialed 3 lbs today as 2 lb felt to light but only 5-10 reps and encouraged to modify exercises for max comfort ie) lower or no weight if needed.  Exercises conducted and issued in handout included: -Wrist flexion -Wrist extension -Forearm pronation -Forearm supination Encouraged to move through Stages listed below as comfort and no increased symptoms allow. Stage 1: Bend your elbow to 90 degrees and support your forearm on a table with your wrist placed at the edge.  Stage 2: Straighten your elbow slightly. Continue to support your arm on the table.  Stage 3: Fully straighten your elbow and lift your arm so that it is no longer supported by the table  Finally added 2 putty exercises to use as stress ball squeeze and rubber band stretch ie) squeeze putty and stretch putty looped around his fingers.   PATIENT EDUCATION: Education details: Epicondylitis Exercises with handouts Person  educated: Patient Education method: Explanation, Demonstration, Verbal cues, and Handouts Education comprehension: verbalized understanding and needs further education  HOME EXERCISE PROGRAM: 02/16/23 - Epicondylitis Conservative Treatment handout 03/01/23 - Lateral Epicondylitis Exercise Program - wrist flex/ext, Forearm pro/sup, grip and finger ext  GOALS: Goals reviewed with patient? Yes  SHORT TERM GOALS: Target date: 03/18/23  Patient will demonstrate ROM HEP with 25% verbal cues or less for proper execution. Baseline: New injury Goal status: IN Progress  2.  Patient will demonstrate at least 75 lbs RUE grip strength as needed to open jars and other containers. Baseline: Right: 47.6, 54.0, 65.0 Average 55.5 lbs Goal status: IN Progress  3.  Patient will report pain in the RUE <5/10 with use. Baseline: 10/10 with use Goal status: IN Progress  4.  Patient will be able to carry 10 lbs with elbow flexed without difficulty.  Baseline: Using L hand to carry briefcase. Goal status: INITIAL   LONG TERM GOALS: Target date: 04/15/23  Patient will demonstrate HEP including use of home based modalities with Mod I proper execution. Baseline: New Injury Goal status: IN Progress  2.  Patient will demonstrate at least 90+ lbs RUE grip strength. Baseline: Right: 47.6, 54.0, 65.0 Average 55.5 lbs Goal status: INITIAL  3.  Patient will be able to lift 14 lb bowling ball to resume leisure activity and will report no more than mild difficulty. Baseline: unable to bowl per UEFI, 10/10 pain Goal status: INITIAL  4.  Patient will be aware of 3 joint protection, ergonomics, and body mechanic principles as needed to improve UE pain.  Baseline: 10/10 pain and unable to bowl. Goal status: INITIAL   ASSESSMENT:  CLINICAL IMPRESSION: Patient is a 52 y.o. male who was seen today for occupational therapy treatment for tennis elbow. He has had pain in his right forearm, described as shooting pain  extending from the elbow, particularly when shaking hands or lifting objects weighing more than five pounds for approximately 6 weeks now. Ultrasound trialed today with HEP program provided. Patient currently presents below baseline level  of function especially for grip strength and will benefit from skilled OT services in the outpatient setting to work maximizing strength with decreased pain and good joint protection ideas.Marland Kitchen     PERFORMANCE DEFICITS: in functional skills including IADLs, strength, pain, Gross motor control, and UE functional use.  IMPAIRMENTS: are limiting patient from IADLs and leisure.   COMORBIDITIES: may have co-morbidities  that affects occupational performance. Patient will benefit from skilled OT to address above impairments and improve overall function.  REHAB POTENTIAL: Excellent  PLAN:  OT FREQUENCY: 1-2x/week  OT DURATION: 6 weeks  PLANNED INTERVENTIONS: therapeutic exercise, therapeutic activity, manual therapy, electrical stimulation, ultrasound, iontophoresis, coping strategies training, and DME and/or AE instructions  RECOMMENDED OTHER SERVICES: NA  CONSULTED AND AGREED WITH PLAN OF CARE: Patient  PLANs FOR NEXT SESSION:   Modalities - ultrasound/estim; heat/ice Review stretching/HEP progression, isometrics,  Will need to explore modifications for bowling to prevent further exacerbations Explore taping options.   Victorino Sparrow, OT 03/01/2023, 4:55 PM

## 2023-03-03 ENCOUNTER — Other Ambulatory Visit: Payer: Self-pay | Admitting: Cardiology

## 2023-03-03 DIAGNOSIS — I10 Essential (primary) hypertension: Secondary | ICD-10-CM

## 2023-03-09 ENCOUNTER — Ambulatory Visit: Payer: 59 | Admitting: Occupational Therapy

## 2023-03-09 DIAGNOSIS — M7701 Medial epicondylitis, right elbow: Secondary | ICD-10-CM

## 2023-03-09 DIAGNOSIS — M25521 Pain in right elbow: Secondary | ICD-10-CM | POA: Diagnosis not present

## 2023-03-09 DIAGNOSIS — M7711 Lateral epicondylitis, right elbow: Secondary | ICD-10-CM

## 2023-03-09 NOTE — Therapy (Signed)
OUTPATIENT OCCUPATIONAL THERAPY ORTHO TREATMENT  Patient Name: Wayne Morales MRN: [id number] DOB:09-Jul-1971, 52 y.o., male Today's Date: 03/09/2023  PCP: Etta Grandchild, MD REFERRING PROVIDER: Etta Grandchild, MD  END OF SESSION:  OT End of Session - 03/09/23 1531     Visit Number 3    Number of Visits 12   + evaluation   Date for OT Re-Evaluation 04/08/23    Authorization Type UHC 2024 VL: 60 combined w/ PT&ST Auth Not Reqd    OT Start Time 1532    OT Stop Time 1615    OT Time Calculation (min) 43 min    Equipment Utilized During Treatment --    Activity Tolerance Patient tolerated treatment well    Behavior During Therapy [facility] for tasks assessed/performed             Past Medical History:  Diagnosis Date   Atrial fibrillation (HCC)    Chronic back pain    Hyperlipidemia    diet controlled   Hypertension    on meds   Scalp psoriasis 12/15/2012   Past Surgical History:  Procedure Laterality Date   INGUINAL HERNIA REPAIR Left    as a child   Patient Active Problem List   Diagnosis Date Noted   Hyperglycemia 01/24/2023   Narcolepsy due to underlying 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 09/08/2022   Panic anxiety syndrome 05/19/2022   GERD (gastroesophageal reflux disease) 05/19/2022   GAD (generalized anxiety disorder) 05/17/2022   Drug-induced erectile dysfunction 06/02/2021   Encounter for general adult medical examination with abnormal findings 04/01/2021   Vitamin D deficiency disease 04/01/2021   Colon cancer screening 04/01/2021   Pure hyperglyceridemia 09/09/2017   Routine general medical examination at a health care facility 09/07/2017   Onychomycosis of great toe 09/07/2017   Protrusion of lumbar intervertebral disc    Essential hypertension 07/08/2014   Scalp psoriasis  12/15/2012   Hyperlipidemia with target LDL less than 160 07/08/2008    ONSET DATE: 01/24/2023  REFERRING DIAG: M77.11 (ICD-10-CM) - Right tennis elbow  THERAPY DIAG:  Pain in right elbow  Medial epicondylitis of elbow, right  Lateral epicondylitis of right elbow  Rationale for Evaluation and Treatment: Rehabilitation  SUBJECTIVE:   SUBJECTIVE STATEMENT:  Pain hasn't been throbbing but it is still present when he picks up things.  No hurting in the hand/back of the thumb.  All the discomfort is the elbow (does not not radiate to the hand).  Pt accompanied by: self  PERTINENT HISTORY:  Medical Record Review: Recently, he has developed pain in his right forearm, described as shooting pain extending from the elbow, particularly when shaking hands or lifting objects weighing more than five pounds. The pain is located on the outside of the forearm and has been present for about a week and a half. He denies any known injury. He does repetitive activity (bowling).  PRECAUTIONS: None  RED FLAGS: None   WEIGHT BEARING RESTRICTIONS: No  PAIN:  Are you having pain? Not at rest but with use (0/10) no discomfort resting on table nor with just moving arm (ie) flexing it without item doesn't make it hurt)  Pain can get up to 8/10 with use.  Yes: NPRS scale: 8/10 Pain location: Medial aspect of elbow (not to the thumb recently) Pain description: sharp pains in the elbow  Aggravating factors: using arm, with flexion of elbow and shaking someone's hand Relieving factors: not using   The coffee cup test - feels it at elbow but is very mild Can carry briefcase with elbow extended.  FALLS: Has patient fallen in last 6 months? Yes. 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  PATIENT GOALS: Get rid of the pain and increase strength.  NEXT MD VISIT: Not Scheduled  OBJECTIVE:   HAND DOMINANCE: Right  ADLs: Overall ADLs: Independent  FUNCTIONAL OUTCOME MEASURES: Upper Extremity Functional Scale (UEFS): 65  Difficulty noted with following activities: carrying objects including briefcase/computer bag Participating in usual hobbies, recreational or sporting activities (ie bowling). Lifting a bag of groceries to waist level/ above head.  UPPER EXTREMITY ROM:     Active ROM - WNL throughout Right eval Left eval  Shoulder flexion    Shoulder abduction    Shoulder adduction    Shoulder extension    Shoulder internal rotation    Shoulder external rotation    Elbow flexion    Elbow extension    Wrist flexion Discomfort on back of hand   Wrist extension    Wrist ulnar deviation    Wrist radial deviation    Wrist pronation    Wrist supination    (Blank rows = not tested)   UPPER EXTREMITY MMT:     MMT Right eval Left eval  Shoulder flexion 5/5 5/5  Shoulder abduction 5/5 5/5  Shoulder adduction    Shoulder extension    Shoulder internal rotation    Shoulder external rotation    Middle trapezius    Lower trapezius    Elbow flexion 4-/5 - stops quickly  5/5  Elbow extension 4+/5 5/5  Wrist flexion    Wrist extension    Wrist ulnar deviation    Wrist radial deviation    Wrist pronation    Wrist supination    (Blank rows = not tested)  HAND FUNCTION: Grip strength: Right: 47.6, 54.0, 65.0  lbs; Left: 102.7, 109.7, 106.9  lbs, Lateral pinch: Right: 32 lbs, Left: 32 lbs, 3 point pinch: Right: 20 lbs, Left: 20,  lbs, and Tip pinch: Right 17 lbs, Left: 18 lbs  Grip Strength Average: Right: 55.5 lbs Left: 106.4 lbs  Pinching motions do not cause pain but gripping  does.  COORDINATION: NT  SENSATION: WFL  EDEMA: NA  COGNITION: Overall cognitive status: Within functional limits for tasks assessed  OBSERVATIONS at Eval: Pt is a fit gentleman with no balance deficits or obvious coordination impairments who presents with palpable tenderness at the lateral epicondyle and obvious UE strength deficits of his dominant R UE compared to LUE.   TODAY'S TREATMENT:  Manual Techniques:  Manual Therapy/massage to provided to elbow (particularly medial aspect) to improve tissue extensibility, increase range of motion, induce relaxation, modulate pain, and reduce soft tissue swelling, inflammation, with palpable tenderness of ulnar nerve along the medial aspect of the humerus.   Therapeutic Activities:  Reviewed ice massage information in patient instruction for trial at home due to palpable swelling in medial aspect of elbow.   Reviewed positioning of R UE that is impacting comfort with observation of reaching with R UE across middle of the car to passenger seat as an aggravating factor in elbow pain and discomfort.  He is unable to minimize this position due to the size of his equipment and therefore is encouraged to get out of the car first and remove work materials form the passenger side of the car to decrease aggravating motions in R UE.  Therapeutic Exercises:   Reviewed Medial Epicondylitis active stretching exercises (in patient instructions)  Exercises conducted and issued in handout included:  With the elbow flexed and the forearm in a neutral position, actively extend the wrist.   With the elbow in 90 of flexion and the forearm supinated, actively extend the wrist.   With the elbow extended and the forearm in a neutral position, actively extend the wrist.   With the elbow extended and the forearm supinated, actively  extend the wrist.   The exercises recommended to be performed 15 repetitions, with the end stretch held to a count of 15. Encouraged to move motions listed as comfort and no increased symptoms allow.  Encouraged continued participation in previous ROM and putty exercises.  PATIENT EDUCATION: Education details: Epicondylitis Exercises with handouts Person educated: Patient Education method: Explanation, Demonstration, Verbal cues, and Handouts Education comprehension: verbalized understanding and needs further education  HOME EXERCISE PROGRAM: 02/16/23 - Epicondylitis Conservative Treatment handout 03/01/23 - Lateral Epicondylitis Exercise Program - wrist flex/ext, Forearm pro/sup, grip and finger ext  GOALS: Goals reviewed with patient? Yes  SHORT TERM GOALS: Target date: 03/18/23  Patient will demonstrate ROM HEP with 25% verbal cues or less for proper execution. Baseline: New injury Goal status: IN Progress  2.  Patient will demonstrate at least 75 lbs RUE grip strength as needed to open jars and other containers. Baseline: Right: 47.6, 54.0, 65.0 Average 55.5 lbs Goal status: IN Progress  3.  Patient will report pain in the RUE <5/10 with use. Baseline: 10/10 with use Goal status: IN Progress  4.  Patient will be able to carry 10 lbs with elbow flexed without difficulty.  Baseline: Using L hand to carry briefcase. Goal status: IN Progress   LONG TERM GOALS: Target date: 04/15/23  Patient will demonstrate HEP including use of home based modalities with Mod I proper execution. Baseline: New Injury Goal status: IN Progress  2.  Patient will demonstrate at least 90+ lbs RUE grip strength. Baseline: Right: 47.6, 54.0, 65.0 Average 55.5 lbs Goal status: IN Progress  3.  Patient will be able to lift 14 lb bowling ball to resume leisure activity and will report no more than mild difficulty. Baseline: unable to bowl per UEFI, 10/10 pain Goal status: INITIAL  4.  Patient  will be aware of 3 joint protection, ergonomics, and body mechanic principles as needed to improve UE pain.  Baseline: 10/10 pain and unable to bowl. Goal status: IN Progress   ASSESSMENT:  CLINICAL IMPRESSION: Patient is a 52 y.o. male who was seen today for occupational therapy treatment for tennis elbow. He has improvement  in the shooting pains from the elbow to the hand and OTR was able to determine some aggravating factors noted today with instruction to change how he lifts objects out of his car. Patient encouraged to try ice this week along with HEP and massage of medical aspect of elbow with ongoing OT to progress grip strength, ROM without pain and review joint protection recommendations.   PERFORMANCE DEFICITS: in functional skills including IADLs, strength, pain, Gross motor control, and UE functional use.  IMPAIRMENTS: are limiting patient from IADLs and leisure.   COMORBIDITIES: may have co-morbidities  that affects occupational performance. Patient will benefit from skilled OT to address above impairments and improve overall function.  REHAB POTENTIAL: Excellent  PLAN:  OT FREQUENCY: 1-2x/week  OT DURATION: 6 weeks  PLANNED INTERVENTIONS: therapeutic exercise, therapeutic activity, manual therapy, electrical stimulation, ultrasound, iontophoresis, coping strategies training, and DME and/or AE instructions  RECOMMENDED OTHER SERVICES: NA  CONSULTED AND AGREED WITH PLAN OF CARE: Patient  PLANs FOR NEXT SESSION:   Modalities - ultrasound/estim; heat/ice Review stretching/HEP progression, isometrics,  Will need to explore modifications for bowling to prevent further exacerbations Explore taping options.   Victorino Sparrow, OT 03/09/2023, 4:33 PM  +

## 2023-03-09 NOTE — Patient Instructions (Addendum)
Ice can help decrease pain, swelling, and inflammation that can result from injury and from some conditions such as arthritis. Ice massage is a quick and easy way to get the benefits of ice.  You can use an ice cube, but it's easier to use an "ice cup" for ice massage.  -To make an ice cup, fill a small paper or foam cup about two-thirds full, and freeze it until it is solid. -To use the ice cup, peel off the top of the cup so about 1.5 cm (0.5 in.) of ice is showing. The remaining part of the cup is for you to hold on to. -As the ice melts, it will drip, so put a small towel under the area you are icing. -Rub the ice in small circles all over the affected area. Avoid areas where the bone is close to the skin, such as right over your kneecap, the point of your elbow, or your spine. Some people also find it is more comfortable to put a moisture barrier such as a section of plastic bag over the area, so the ice is not directly touching the skin. To try this, hold the plastic in place with one hand and rub the ice over it with the other hand. -If the ice melts down so the cup is touching your skin, peel more of the cup off. -Continue for about 7 to 10 minutes. The area will feel cold at first, then it may burn, then ache, then finally become numb. Your skin will be pink and cold when you finish. -You can do an ice massage several times a day if it helps you.

## 2023-03-14 ENCOUNTER — Ambulatory Visit: Payer: 59 | Admitting: Neurology

## 2023-03-14 ENCOUNTER — Encounter: Payer: Self-pay | Admitting: Neurology

## 2023-03-14 VITALS — BP 122/78 | HR 69 | Ht 67.0 in | Wt 203.0 lb

## 2023-03-14 DIAGNOSIS — G478 Other sleep disorders: Secondary | ICD-10-CM

## 2023-03-14 DIAGNOSIS — R0683 Snoring: Secondary | ICD-10-CM

## 2023-03-14 DIAGNOSIS — G4719 Other hypersomnia: Secondary | ICD-10-CM

## 2023-03-14 DIAGNOSIS — G2581 Restless legs syndrome: Secondary | ICD-10-CM

## 2023-03-14 DIAGNOSIS — R0681 Apnea, not elsewhere classified: Secondary | ICD-10-CM | POA: Diagnosis not present

## 2023-03-14 DIAGNOSIS — G4710 Hypersomnia, unspecified: Secondary | ICD-10-CM

## 2023-03-14 DIAGNOSIS — R400 Somnolence: Secondary | ICD-10-CM

## 2023-03-14 DIAGNOSIS — R6889 Other general symptoms and signs: Secondary | ICD-10-CM

## 2023-03-14 NOTE — Progress Notes (Signed)
Subjective:    Patient ID: Wayne Morales is a 52 y.o. male.  HPI    [name], MD, PhD [facility] [address], [address] [address] [place], [place] [address]  Dear Dr. Yetta Barre,  I saw your patient, Wayne Morales, upon your kind request in my sleep clinic today for initial consultation of his sleep disorder, in particular, his snoring and daytime somnolence.  The patient is unaccompanied today.  As you know, Wayne Morales is a 52 year old male with an underlying medical history of PVCs, reflux disease, hyperlipidemia, hypertension, tennis elbow, chronic low back pain, psoriasis, anxiety and mild obesity, who reports a longstanding history of daytime somnolence. Symptoms dates back to school days when he would fall asleep in class.  He reports that he has a family history of sleepiness, his dad falls asleep easily and his sister has sleep paralysis, dad also has sleep paralysis and patient also endorses a lifelong history of intermittent sleep paralysis.  He endorses vivid dreams, although easily.  He does not tend to fall asleep at the wheel, has never had a problem driving but admits that he switched his job to a more sedentary position in April 2023 and has had instances of nodding off at the desk.  He has nodded off during the meeting and did not even realize that he had fallen asleep.  Endorses intermittent restless leg symptoms for the past couple of years, incidentally he has been on Celexa for the past couple years for anxiety.  He lives with his wife, his 3 year old son is on his own, they lost another son at the age of 5 due to cancer.  He has fallen out of bed twice in the past few months due to a vivid dream.  He has no recurrent nocturnal or morning headaches, no nightly nocturia, does snore but has not had any witnessed apneas or gasping sensations.  His Epworth sleepiness score is 17 out of 24, fatigue severity score is 38 out of 63.  No obvious episodes of  cataplexy.  He does not recall dreaming and his naps but does not tend to nap but can nod off easily. He does not currently take hydroxyzine, has a prescription for as needed use for anxiety flare.  I reviewed your office note from 01/24/2023.  He had seen my colleague, Dr. Teresa Coombs in 2023 for palpitations and generalized weakness, strokelike symptoms.  I reviewed the office visit note from 11/18/2021.  He has also seen cardiology for palpitations.  His Past Medical History Is Significant For: Past Medical History:  Diagnosis Date   Atrial fibrillation (HCC)    pt states they "figured it was not a-fib" but does have trouble with heart valve   Chronic back pain    Hyperlipidemia    diet controlled   Hypertension    on meds   Scalp psoriasis 12/15/2012    His Past Surgical History Is Significant For: Past Surgical History:  Procedure Laterality Date   INGUINAL HERNIA REPAIR Left    as a child    His Family History Is Significant For: Family History  Problem Relation Age of Onset   Hypertension Mother    Hyperlipidemia Mother    Supraventricular tachycardia Mother    Diabetes Father    Colon polyps Father 78   Colon cancer Father 11   Hypertension Sister    Brain cancer Brother    Heart disease Brother    Sarcoidosis Brother    Sarcoidosis Brother  Sleep apnea Neg Hx     His Social History Is Significant For: Social History   Socioeconomic History   Marital status: Married    Spouse name: stephanie   Number of children: 1   Years of education: Not on file   Highest education level: Not on file  Occupational History   Not on file  Tobacco Use   Smoking status: Never   Smokeless tobacco: Never  Vaping Use   Vaping status: Never Used  Substance and Sexual Activity   Alcohol use: Yes    Comment: socially   Drug use: Never   Sexual activity: Yes  Other Topics Concern   Not on file  Social History Narrative   Child passed due to brain and spinal cancer.       Right handed   Caffeine: occasional    Social Determinants of Health   Financial Resource Strain: Not on file  Food Insecurity: Not on file  Transportation Needs: Not on file  Physical Activity: Not on file  Stress: Not on file  Social Connections: Not on file    His Allergies Are:  Allergies  Allergen Reactions   Penicillins Hives and Rash    REACTION: Rash  :   His Current Medications Are:  Outpatient Encounter Medications as of 03/14/2023  Medication Sig   Ascorbic Acid (VITAMIN C PO) Take 1,000 mg by mouth daily at 6 (six) AM.   aspirin (ASPIRIN CHILDRENS) 81 MG chewable tablet Chew 1 tablet (81 mg total) by mouth daily.   Calcium 500-2.5 MG-MCG CHEW Chew by mouth.   citalopram (CELEXA) 40 MG tablet Take 1 tablet (40 mg total) by mouth daily.   diltiazem (CARDIZEM CD) 240 MG 24 hr capsule Take 1 capsule by mouth once daily   Efinaconazole 10 % SOLN Apply 1 drop topically daily.   ELDERBERRY PO Take by mouth.   hydrOXYzine (ATARAX) 25 MG tablet Take 1 tablet (25 mg total) by mouth 3 (three) times daily as needed.   olmesartan (BENICAR) 40 MG tablet Take 1 tablet by mouth once daily   omega-3 acid ethyl esters (LOVAZA) 1 g capsule Take 2 capsules by mouth twice daily   rosuvastatin (CRESTOR) 20 MG tablet Take 1 tablet by mouth once daily   nebivolol (BYSTOLIC) 10 MG tablet Take 1 tablet (10 mg total) by mouth daily. Take ont tab extra if SBP > 140 mm Hg (Patient not taking: Reported on 03/14/2023)   No facility-administered encounter medications on file as of 03/14/2023.  :   Review of Systems:  Out of a complete 14 point review of systems, all are reviewed and negative with the exception of these symptoms as listed below:   Review of Systems  Neurological:        Patient is here alone for sleep consult. He states he has had trouble with daytime sleepiness all his life. 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 He falls asleep at his desk without realizing it. His father would fall asleep too if he sat still. Pt's wife states he snores. He has never had a sleep study. No known family history of sleep apnea.     Objective:  Neurological Exam  Physical Exam Physical Examination:   Vitals:   03/14/23 0843  BP: 122/78  Pulse: 69  SpO2: 95%    General Examination: The patient is  a very pleasant 52 y.o. male in no acute distress. He appears well-developed and well-nourished and well groomed.   HEENT: Normocephalic, atraumatic, pupils are equal, round and reactive to light, corrective eyeglasses in place.  Extraocular tracking is good without limitation to gaze excursion or nystagmus noted. Hearing is grossly intact. Face is symmetric with normal facial animation. Speech is clear with no dysarthria noted. There is no hypophonia. There is no lip, neck/head, jaw or voice tremor. Neck is supple with full range of passive and active motion. There are no carotid bruits on auscultation. Oropharynx exam reveals: No sniffing and mouth dryness, good dental hygiene, no significant airway crowding other than slightly wider uvula, Mallampati class I, tonsillar size of about 1+, left side easier to see than R.  No overbite, slight crossbite.  Tongue protrudes centrally and palate elevates symmetrically, neck circumference 17-1/4 inches.  Chest: Clear to auscultation without wheezing, rhonchi or crackles noted.  Heart: S1+S2+0, regular and normal without murmurs, rubs or gallops noted.   Abdomen: Soft, non-tender and non-distended.  Extremities: There is no pitting edema in the distal lower extremities bilaterally.   Skin: Warm and dry without trophic changes noted.   Musculoskeletal: exam reveals no obvious joint deformities.   Neurologically:  Mental status: The patient is awake, alert and oriented in all 4 spheres. His immediate and remote memory, attention,  language skills and fund of knowledge are appropriate. There is no evidence of aphasia, agnosia, apraxia or anomia. Speech is clear with normal prosody and enunciation. Thought process is linear. Mood is normal and affect is normal.  Cranial nerves II - XII are as described above under HEENT exam.  Motor exam: Normal bulk, strength and tone is noted. There is no obvious action or resting tremor.  Fine motor skills and coordination: grossly intact.  Cerebellar testing: No dysmetria or intention tremor. There is no truncal or gait ataxia.  Sensory exam: intact to light touch in the upper and lower extremities.  Gait, station and balance: He stands easily. No veering to one side is noted. No leaning to one side is noted. Posture is age-appropriate and stance is narrow based. Gait shows normal stride length and normal pace. No problems turning are noted.   Assessment and Plan:  In summary, Wayne Morales is a very pleasant 52 y.o.-year old male with an underlying medical history of PVCs, reflux disease, hyperlipidemia, hypertension, tennis elbow, chronic low back pain, psoriasis, anxiety and mild obesity, who presents for evaluation of his hypersomnolence.  His history and examination are not telltale for underlying obstructive sleep disordered breathing, he may have an underlying hypersomnolence disorder such as narcolepsy versus idiopathic hypersomnolence.  Obstructive sleep apnea is not excluded at this time.  I talked to the patient at length, he is advised to proceed with extended sleep testing in the form of a nocturnal polysomnogram and next day nap testing.  I explained the test procedures in detail to the patient.  We talked about the diagnostic possibilities here.  If he has sleep disordered breathing such as obstructive sleep apnea, I will likely recommend treatment with an AutoPap machine or CPAP machine.  He endorses some restless leg symptoms which very well could tie in with taking Celexa as he  started Celexa about 2 years ago and started having restless leg symptoms around the same time.  In preparation for his excessive sleep testing he would have to taper off the Celexa.  He is willing to do so and talk to his  prescribing physician about coming off gradually as he is currently on 40 mg daily.  He currently does not take the hydroxyzine but is advised to stay off the hydroxyzine and Celexa for 2 weeks prior to sleep testing.  He was given detailed instructions verbally as well as in his MyChart after visit summary.  We will plan to follow-up after testing.  I answered all his questions today and he was in agreement with our plan.   Thank you very much for allowing me to participate in the care of this nice patient. If I can be of any further assistance to you please do not hesitate to call me at [phone].  Sincerely,   [name], MD, PhD

## 2023-03-14 NOTE — Patient Instructions (Addendum)
Thank you for choosing [facility] for your sleep related care! It was nice to meet you today! I appreciate that you entrust me with your sleep related healthcare concerns. I hope, I was able to address at least some of your concerns today, and that I can help you feel reassured and also get better.    Here is what we discussed today and what we came up with as our plan for you:    Based on your symptoms and your exam I believe you may have an underlying sleepiness condition. We can look into your sleepiness and tiredness, which you had for years with a nighttime sleep study, followed by a daytime nap study. In preparation for sleep study testing, however, you would have to taper off your citalopram. Please talk to your prescribing physician about slowly coming off of it. You have to be completely off of citalopram and hydroxyzine for 2 weeks prior to testing.    The overnight sleep study will help determine whether you do or do not have OSA and how severe it is. Even, if you have mild OSA, I may want you to consider treatment with CPAP, as treatment of even borderline or mild sleep apnea can result and improvement of symptoms such as sleep disruption, daytime sleepiness, nighttime bathroom breaks, restless leg symptoms, improvement of headache syndromes, even improved mood disorder.   Please remember, the long-term risks and ramifications of untreated moderate to severe obstructive sleep apnea are: increased Cardiovascular disease, including congestive heart failure, stroke, difficult to control hypertension, treatment resistant obesity, arrhythmias, especially irregular heartbeat commonly known as A. Fib. (atrial fibrillation); even type 2 diabetes has been linked to untreated OSA.   Sleep apnea can cause disruption of sleep and sleep deprivation in most cases, which, in turn, can cause recurrent headaches, problems with memory, mood, concentration, focus, and vigilance. Most people  with untreated sleep apnea report excessive daytime sleepiness, which can affect their ability to drive. Please do not drive if you feel sleepy. Patients with sleep apnea can also develop difficulty initiating and maintaining sleep (aka insomnia).   Having sleep apnea may increase your risk for other sleep disorders, including involuntary behaviors sleep such as sleep terrors, sleep talking, sleepwalking.    Having sleep apnea can also increase your risk for restless leg syndrome and leg movements at night. Keep in mind, your restless legs symptoms may also tie in with taking Celexa.  Please note that untreated obstructive sleep apnea may carry additional perioperative morbidity. Patients with significant obstructive sleep apnea (typically, in the moderate to severe degree) should receive, if possible, perioperative PAP (positive airway pressure) therapy and the surgeons and particularly the anesthesiologists should be informed of the diagnosis and the severity of the sleep disordered breathing.   Please do not drive when feeling sleepy.   I will plan to see you back after your sleep study to go over the test results and where to go from there. We will call you after your sleep study to advise about the results (most likely, you will hear from my nurse) and to set up an appointment at the time, as necessary.

## 2023-03-15 ENCOUNTER — Telehealth: Payer: Self-pay | Admitting: Neurology

## 2023-03-15 ENCOUNTER — Ambulatory Visit: Payer: 59 | Admitting: Occupational Therapy

## 2023-03-15 DIAGNOSIS — M25521 Pain in right elbow: Secondary | ICD-10-CM

## 2023-03-15 DIAGNOSIS — M7711 Lateral epicondylitis, right elbow: Secondary | ICD-10-CM

## 2023-03-15 DIAGNOSIS — M7701 Medial epicondylitis, right elbow: Secondary | ICD-10-CM

## 2023-03-15 DIAGNOSIS — M6281 Muscle weakness (generalized): Secondary | ICD-10-CM

## 2023-03-15 NOTE — Telephone Encounter (Signed)
NPSG/MSLT uhc pending uploaded notes.

## 2023-03-15 NOTE — Patient Instructions (Signed)
[id number] (8/07) - Page 1 of 6 [facility]  Rehabilitation and Sports Medicine Services Joint Protection Principles Therapist Phone  Joint protection principles are a series of techniques which can be included into all activities. This will reduce the stress on your joints. Joints that have been weakened by arthritis are at risk of being damaged by stress and strain. Improper use of diseased joints may lead to impaired function and deformity. Joint protection techniques are ways of doing activities so that the risk of deformity is decreased. 1. Respect For Pain Stop activities before you reach the point of discomfort or pain. Limit activities which cause your pain to last more than one hour after you have stopped the activity. 2. Balance Activity And Rest Rest before becoming tired. Plan rest periods during longer or more difficult activities. By resting 10 minutes during an activity, you will have more energy to continue. 3. Avoid Activities Which Cannot Be Stopped When you begin to feel joint pain, stop. This will eliminate excessive pain and fatigue later. Prioritize activities. Consider the activity, length of time, and difficulty before beginning. Plan difficult activities for "peak" energy times. 4. Use Larger, Stronger Joints For Activities, When Possible, Distributing The Weight Over Non-involved Or Stronger Joints.  To lift a bag from a counter, bend your knees, hug the bag with both arms. Bend your elbows so that the bag is held tightly to your chest and straighten your knees. Keep hold on the bag by keeping your elbows bent. If the load is too heavy, push shopping cart, or get help with groceries - use drive-up service.  You can use your hip to push open doors, and your feet to close lower drawers. OVER  [id number] (8/07) - Page 2 of 6  An envelope briefcase with a snap lock can be used rather than an attache case. By bending the elbows, the case can be carried under the arm so that  the case rests on the forearm. Hold the case by resting your arm against your body. Switch the case from one side of the body to the other.  Use the larger joints (elbow or shoulder) to carry the weight of the purse.  Wrong: The weight of the purse is all on the weak fingers.  Wing faucets: Keeping wrists extended, use the palm of your left hand to turn on a left faucet and the back of your left hand to turn the faucet off.  Four-pronged or circular faucets: Place palm of hand on top of the faucet keeping fingers extended. Straighten your elbow and apply a downward force on faucet, pushing from your shoulder. Keeping fingers and elbow extended, turn your arm inward toward your thumb. Right: The stronger elbow should carry the weight of the purse. CONTINUED ON NEXT PAGE [id number] (8/07) - Page 3 of 6  Wrong: Do not use fingers to lift heavy roasting pans or dishes.  Wrong: All the weight of the pot would on your weak fingers. 5. Avoid Staying In One Position For Extended Periods Of Time. Plan rest periods. Change your position. Stretch and relax your joints. 6. Maintain Or Use Your Joints In Good Alignment. Maintain proper posture.  This is good alignment. Right: Use oven mitts and lift with palms, using the stronger wrists and elbows to do the work. Right: Pick up the pot with two hands, using your palms. Avoid or change activities that cause your fingers to move towards the little finger side of your hand. OVER [id number] (8/07) -  Page 4 of 6  Use the palms of your hands for lifting and pushing. Push instead of pulling. 7. Maintain Proper Weight. Additional weight can stress weight-bearing joints (hip, knees, feet, back). Special Considerations For The Hands  1. AVOID TIGHT GRASP. Use a relaxed grip. Enlarge handles.  Place palm of hand on jar lid, and using weight if body, turn arm at shoulder to open jar. A sponge or wet towel under the jar prevents sliding.  2. AVOID PRESSURE ON BACK OF  KNUCKLES (MP JOINTS).  Wrong Right When rising from chair or bed. Dishwashing should be done with fingers kept straight as much as possible. It a dishwasher is available, use it in preference to washing by hand. Hold the knife or mixing spoon like a dagger, with the handle parallel to knuckles. Cutting is then changed from sawing to pulling. CONTINUED ON NEXT PAGE [id number] (8/07) - Page 5 of 6  3. USE BOTH HANDS WHEN POSSIBLE  4. AVOID REPETITIVE HAND ACTIVITIES Take breaks Change activity, i.e. using screwdriver, crocheting.  5. AVOID PRESSURE TO TIP OF THUMB Example: pushing snaps together, opening car doors, ringing doorbells.  To protect thumb joints, open milk containers with heels of the hands rather than thumbs. PostureWhether walking, standing, sitting or even sleeping, good posture is important for people with arthritis. Poor posture can make arthritis worse. As for standing, you should stand straight, head high, shoulders back, stomach in, and hips and knees straight. WalkingWalk erect, as in standing position. Arm swing freely at sides; let your weight shift easily from side to the other. Don't carry heavy packages in one hand. A lightweight shoulder bag is a good idea. If legs or knees are involved, a cane will make walking easier. Ring top cans: Hold the can with one hand. With the other hand, place a knife through the ring with handle of knife directly over the opening. Using the palm of your hand, push down on the handle of the knife. OVER [id number] (8/07) - Page 6 of 6  Resting/SleepingPatients with rheumatoid arthritis should avoid bent knees or arms. Lie straight at sides, knees and hips straight. Use a firm mattress or put plywood board between mattress and bedspring. If you need a pillow under your head, use a thin one. Keep sheets and blankets loose over your feet, perhaps by using a blanket support. If your arthritis is in your back, you may need a different position for sleeping.  Ask your doctor. SittingKeep good posture when sitting down. Use straight-back armchairs with firm seats. Sit with head up, shoulders back, stomach in, feet flat on floor. Use arms of chair to stand up slowly. References AOTA's, Workbook for Consumers with R.A.   Cordery, Vanita Panda, M.A.O.T., OTR, "Joint Protection - A Responsibility for the Occupational Therapist," American Journal of Occupational Therapy, XIX, %, 1965.   "Joint Protection," Occupational Therapy Department, [facility] [place], [place], [place].   Alesia Morin: Rheumatic Disease Occupational Therapy and Rehab, [phone].   "Principles of Joint Protection," Occupational Therapy Department, [facility], [place], [place].   [employer], 2841.   Sammuel Cooper, OTR, and Tonye Pearson, OTR, "Joint Preservation Techniques for Patients with Rheumatoid Arthritis," Department of Occupational Therapy, [facility], [place], [place].  TYLER TWIST:  [email or web address]

## 2023-03-15 NOTE — Therapy (Unsigned)
OUTPATIENT OCCUPATIONAL THERAPY ORTHO TREATMENT  Patient Name: Wayne Morales MRN: [id number] DOB:12/15/1970, 52 y.o., male Today's Date: 03/15/2023  PCP: Etta Grandchild, MD REFERRING PROVIDER: Etta Grandchild, MD  END OF SESSION:  OT End of Session - 03/15/23 1623     Visit Number 4    Number of Visits 12   + evaluation   Date for OT Re-Evaluation 04/08/23    Authorization Type [employer] 2024 VL: 60 combined w/ PT&ST Auth Not Reqd    OT Start Time 1622    OT Stop Time 1704    OT Time Calculation (min) 42 min    Activity Tolerance Patient tolerated treatment well    Behavior During Therapy WFL for tasks assessed/performed            Past Medical History:  Diagnosis Date   Atrial fibrillation (HCC)    pt states they "figured it was not a-fib" but does have trouble with heart valve   Chronic back pain    Hyperlipidemia    diet controlled   Hypertension    on meds   Scalp psoriasis 12/15/2012   Past Surgical History:  Procedure Laterality Date   INGUINAL HERNIA REPAIR Left    as a child   Patient Active Problem List   Diagnosis Date Noted   Hyperglycemia 01/24/2023   Narcolepsy due to underlying 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 09/08/2022   Panic anxiety syndrome 05/19/2022   GERD (gastroesophageal reflux disease) 05/19/2022   GAD (generalized anxiety disorder) 05/17/2022   Drug-induced erectile dysfunction 06/02/2021   Encounter for general adult medical examination with abnormal findings 04/01/2021   Vitamin D deficiency disease 04/01/2021   Colon cancer screening 04/01/2021   Pure hyperglyceridemia 09/09/2017   Routine general medical examination at a health care facility 09/07/2017   Onychomycosis of great toe 09/07/2017   Protrusion of lumbar intervertebral disc    Essential  hypertension 07/08/2014   Scalp psoriasis 12/15/2012   Hyperlipidemia with target LDL less than 160 07/08/2008    ONSET DATE: 01/24/2023  REFERRING DIAG: M77.11 (ICD-10-CM) - Right tennis elbow  THERAPY DIAG:  Pain in right elbow  Medial epicondylitis of elbow, right  Lateral epicondylitis of right elbow  Muscle weakness (generalized)  Rationale for Evaluation and Treatment: Rehabilitation  SUBJECTIVE:   SUBJECTIVE STATEMENT:  Pain hasn't been throbbing but it is still present when he picks up things.  No hurting in the hand/back of the thumb.  All the discomfort is the elbow (does not not radiate to the hand).  Pt accompanied by: self  PERTINENT HISTORY:  Medical Record Review: Recently, he has developed pain in his right forearm, described as shooting pain extending from the elbow, particularly when shaking hands or lifting objects weighing more than five pounds. The pain is located on the outside of the forearm and has been present for about a week and a half. He denies any known injury. He does repetitive activity (bowling).  PRECAUTIONS: None  WEIGHT BEARING RESTRICTIONS: No  PAIN:  Are you having pain? 0/10 - worst pain in last 24 hours 4-5/10  FALLS: Has patient fallen in last 6 months? Yes. 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  PATIENT GOALS: Get rid of the pain and increase strength.  NEXT MD VISIT: Not Scheduled  OBJECTIVE:   HAND DOMINANCE: Right  ADLs: Overall ADLs: Independent  FUNCTIONAL OUTCOME MEASURES: Upper Extremity Functional Scale (UEFS): 65  Difficulty noted with following  activities: carrying objects including briefcase/computer bag Participating in usual hobbies, recreational or sporting activities (ie bowling). Lifting a bag of groceries to waist level/ above head.  UPPER EXTREMITY ROM:     Active ROM - WNL throughout Right eval Left eval  Shoulder flexion    Shoulder abduction    Shoulder adduction    Shoulder extension    Shoulder internal rotation    Shoulder external rotation    Elbow flexion    Elbow extension    Wrist flexion Discomfort on back of hand   Wrist extension    Wrist ulnar deviation    Wrist radial deviation    Wrist pronation    Wrist supination    (Blank rows = not tested)   UPPER EXTREMITY MMT:     MMT Right eval Left eval  Shoulder flexion 5/5 5/5  Shoulder abduction 5/5 5/5  Shoulder adduction    Shoulder extension    Shoulder internal rotation    Shoulder external rotation    Middle trapezius    Lower trapezius    Elbow flexion 4-/5 - stops quickly  5/5  Elbow extension 4+/5 5/5  Wrist flexion    Wrist extension    Wrist ulnar deviation    Wrist radial deviation    Wrist pronation    Wrist supination    (Blank rows = not tested)  HAND FUNCTION: Grip strength: Right: 47.6, 54.0, 65.0  lbs; Left: 102.7, 109.7, 106.9  lbs, Lateral pinch: Right: 32 lbs, Left: 32 lbs, 3 point pinch: Right: 20 lbs, Left: 20,  lbs, and Tip pinch: Right 17 lbs, Left: 18 lbs  Grip Strength Average: Right: 55.5 lbs Left: 106.4 lbs  Pinching motions do not cause pain but gripping does.  COORDINATION: NT  SENSATION: WFL  EDEMA: NA  COGNITION: Overall cognitive status: Within functional limits for tasks assessed  OBSERVATIONS at Eval: Pt is a fit gentleman with no balance deficits or obvious coordination impairments who presents with palpable tenderness at the lateral epicondyle and obvious UE strength deficits of his dominant R UE compared to LUE.  TODAY'S TREATMENT:                                                                                                                               - Self-care/home management completed for duration as noted below including: Objective measures assessed as noted in Goals section to determine progression towards goals. OT educated patient  on sleep positioning as noted in patient instructions  to reduce stress to upper extremity nerves, which could be attributing to reported paresthesias and pain in affected extremity. Patient verbalized understanding. Handout provided. OT educated pt on joint protection principles and ergonomics as noted in pt instructions to prevent pain flares.  - Therapeutic exercises completed for duration as noted below including: Therapist had pt complete lift and carry of 10 lb kettle bell with R elbow flexed. Updated goal appropriately. Therapist had pt complete simulated "bowling" with 10 lb weighted ball across gym for strengthening and monitoring of discomfort/pain.   Initiated Joselyn Glassman Twist as noted in pt instructions for strengthening of R forearm.   PATIENT EDUCATION: Education details: Brewing technologist; strengthening; sleep positioning Person educated: Patient Education method: Explanation, Demonstration, Verbal cues, and Handouts Education comprehension: verbalized understanding, returned demonstration, and needs further education  HOME EXERCISE PROGRAM: 02/16/23 - Epicondylitis Conservative Treatment handout 03/01/23 - Lateral Epicondylitis Exercise Program - wrist flex/ext, Forearm pro/sup, grip and finger ext  GOALS: Goals reviewed with patient? Yes  SHORT TERM GOALS: Target date: 03/18/23  Patient will demonstrate ROM HEP with 25% verbal cues or less for proper execution. Baseline: New injury Goal status: MET  2.  Patient will demonstrate at least 75 lbs RUE grip strength as needed to open jars and other containers. Baseline: Right: 47.6, 54.0, 65.0 Average 55.5 lbs 03/15/2023: 96.7 lbs Goal status: MET  3.  Patient will report pain  in the RUE <5/10 with use. Baseline: 10/10 with use 03/15/2023 - up to 5/10 pain with use Goal status: IN Progress  4.  Patient will be able to carry 10 lbs with elbow flexed without difficulty.  Baseline: Using L hand to carry briefcase. Goal status: MET   LONG TERM GOALS: Target date: 04/15/23  Patient will demonstrate HEP including use of home based modalities with Mod I proper execution. Baseline: New Injury Goal status: IN Progress  2.  Patient will demonstrate at least 90+ lbs RUE grip strength. Baseline: Right: 47.6, 54.0, 65.0 Average 55.5 lbs 03/15/2023: 96.7 lbs Goal status: MET  3.  Patient will be able to lift 14 lb bowling ball to resume leisure activity and will report no more than mild difficulty. Baseline: unable to bowl per UEFI, 10/10 pain Goal status: INITIAL  4.  Patient will be aware of 3 joint protection, ergonomics, and body mechanic principles as needed to improve UE pain.  Baseline: 10/10 pain and unable to bowl. Goal status: IN Progress   ASSESSMENT:  CLINICAL IMPRESSION: Patient demonstrates good understanding of pain reduction strategies and progression towards goals. Will focus on increasing strength while monitoring pain.   PERFORMANCE DEFICITS: in functional skills including IADLs, strength, pain, Gross motor control, and UE functional use.  IMPAIRMENTS: are limiting patient from IADLs and leisure.   COMORBIDITIES: may have co-morbidities  that affects occupational performance. Patient will benefit from skilled OT to address above impairments and improve overall function.  REHAB POTENTIAL: Excellent  PLAN:  OT FREQUENCY: 1-2x/week  OT DURATION: 6 weeks  PLANNED INTERVENTIONS: therapeutic exercise, therapeutic activity, manual therapy, electrical stimulation, ultrasound, iontophoresis, coping strategies training, and DME and/or AE instructions  RECOMMENDED OTHER SERVICES: NA  CONSULTED AND AGREED WITH PLAN OF CARE: Patient  PLANs FOR  NEXT SESSION:  Rebounder; review Eber Jones; go over Primary school teacher, Ergonomics, body mechanics goal   Modalities - ultrasound/estim; heat/ice Review stretching/HEP progression, isometrics,  Will need to explore modifications for bowling to prevent further exacerbations Explore taping options.   Delana Meyer, OT 03/15/2023, 5:13 PM

## 2023-03-16 ENCOUNTER — Encounter: Payer: Self-pay | Admitting: Internal Medicine

## 2023-03-17 ENCOUNTER — Ambulatory Visit: Payer: 59 | Admitting: Occupational Therapy

## 2023-03-17 DIAGNOSIS — M25521 Pain in right elbow: Secondary | ICD-10-CM | POA: Diagnosis not present

## 2023-03-17 DIAGNOSIS — M7711 Lateral epicondylitis, right elbow: Secondary | ICD-10-CM

## 2023-03-17 DIAGNOSIS — M6281 Muscle weakness (generalized): Secondary | ICD-10-CM

## 2023-03-17 NOTE — Therapy (Signed)
OUTPATIENT OCCUPATIONAL THERAPY ORTHO TREATMENT  Patient Name: Kenyetta Groseclose MRN: [id number] DOB:10/28/1970, 52 y.o., male Today's Date: 03/17/2023  PCP: Etta Grandchild, MD REFERRING PROVIDER: Etta Grandchild, MD  END OF SESSION:  OT End of Session - 03/17/23 1618     Visit Number 5    Number of Visits 12   + evaluation   Date for OT Re-Evaluation 04/08/23    Authorization Type UHC 2024 VL: 60 combined w/ PT&ST Auth Not Reqd    OT Start Time 1618    OT Stop Time 1656    OT Time Calculation (min) 38 min    Activity Tolerance Patient tolerated treatment well    Behavior During Therapy WFL for tasks assessed/performed            Past Medical History:  Diagnosis Date   Atrial fibrillation (HCC)    pt states they "figured it was not a-fib" but does have trouble with heart valve   Chronic back pain    Hyperlipidemia    diet controlled   Hypertension    on meds   Scalp psoriasis 12/15/2012   Past Surgical History:  Procedure Laterality Date   INGUINAL HERNIA REPAIR Left    as a child   Patient Active Problem List   Diagnosis Date Noted   Hyperglycemia 01/24/2023   Narcolepsy due to underlying 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 09/08/2022   Panic anxiety syndrome 05/19/2022   GERD (gastroesophageal reflux disease) 05/19/2022   GAD (generalized anxiety disorder) 05/17/2022   Drug-induced erectile dysfunction 06/02/2021   Encounter for general adult medical examination with abnormal findings 04/01/2021   Vitamin D deficiency disease 04/01/2021   Colon cancer screening 04/01/2021   Pure hyperglyceridemia 09/09/2017   Routine general medical examination at a health care facility 09/07/2017   Onychomycosis of great toe 09/07/2017   Protrusion of lumbar intervertebral disc    Essential  hypertension 07/08/2014   Scalp psoriasis 12/15/2012   Hyperlipidemia with target LDL less than 160 07/08/2008    ONSET DATE: 01/24/2023  REFERRING DIAG: M77.11 (ICD-10-CM) - Right tennis elbow  THERAPY DIAG:  Pain in right elbow  Muscle weakness (generalized)  Lateral epicondylitis of right elbow  Rationale for Evaluation and Treatment: Rehabilitation  SUBJECTIVE:   SUBJECTIVE STATEMENT:  Pt reports he uses a hook ball with bowling, which places the weight on the 3rd and 4th digits as he extends his supinated arm/elbow to pronated arm.   Pt accompanied by: self  PERTINENT HISTORY:  Medical Record Review: Recently, he has developed pain in his right forearm, described as shooting pain extending from the elbow, particularly when shaking hands or lifting objects weighing more than five pounds. The pain is located on the outside of the forearm and has been present for about a week and a half. He denies any known injury. He does repetitive activity (bowling).  PRECAUTIONS: None  WEIGHT BEARING RESTRICTIONS: No  PAIN:  Are you having pain? 2-3/10 - worst pain in last 24 hours 5/10  FALLS: Has patient fallen in last 6 months? Yes. 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  PATIENT GOALS: Get rid of the pain and increase strength.  NEXT MD VISIT: Not Scheduled  OBJECTIVE:   HAND DOMINANCE: Right  ADLs: Overall ADLs: Independent  FUNCTIONAL OUTCOME MEASURES: Upper Extremity Functional Scale (UEFS): 65  Difficulty noted with following activities: carrying objects including briefcase/computer bag Participating in usual  hobbies, recreational or sporting activities (ie bowling). Lifting a bag of groceries to waist level/ above head.  UPPER EXTREMITY ROM:     Active ROM - WNL throughout Right eval Left eval  Shoulder flexion    Shoulder abduction    Shoulder adduction    Shoulder extension    Shoulder internal rotation    Shoulder external rotation    Elbow flexion    Elbow extension    Wrist flexion Discomfort on back of hand   Wrist extension    Wrist ulnar deviation    Wrist radial deviation    Wrist pronation    Wrist supination    (Blank rows = not tested)   UPPER EXTREMITY MMT:     MMT Right eval Left eval  Shoulder flexion 5/5 5/5  Shoulder abduction 5/5 5/5  Shoulder adduction    Shoulder extension    Shoulder internal rotation    Shoulder external rotation    Middle trapezius    Lower trapezius    Elbow flexion 4-/5 - stops quickly  5/5  Elbow extension 4+/5 5/5  Wrist flexion    Wrist extension    Wrist ulnar deviation    Wrist radial deviation    Wrist pronation    Wrist supination    (Blank rows = not tested)  HAND FUNCTION: Grip strength: Right: 47.6, 54.0, 65.0  lbs; Left: 102.7, 109.7, 106.9  lbs, Lateral pinch: Right: 32 lbs, Left: 32 lbs, 3 point pinch: Right: 20 lbs, Left: 20,  lbs, and Tip pinch: Right 17 lbs, Left: 18 lbs  Grip Strength Average: Right: 55.5 lbs Left: 106.4 lbs  Pinching motions do not cause pain but gripping does.  COORDINATION: NT  SENSATION: WFL  EDEMA: NA  COGNITION: Overall cognitive status: Within functional limits for tasks assessed  OBSERVATIONS at Eval: Pt is a fit gentleman with no balance deficits or obvious coordination impairments who presents with palpable tenderness at the lateral epicondyle and obvious UE strength deficits of his dominant R UE compared to LUE.  TODAY'S TREATMENT:                                                                                                                              - Manual  therapy completed for duration as noted below including: Restoration of length tension relationship to musculature at  R proximal forearm to promote improved AROM and pain reduction of affected extremity. Pt sitting in chair: IASTM using edge mobility tool and free up lotion as emollient.   -  Therapeutic exercises completed for duration as noted below including: With use of R, pt completed toss and catch of  4,  6,  9 lb weighted ball at rebounder for proprioceptive input as needed to tolerate impact and force through this extremity.  Ball roll at wall with 4 lb ball and RUE for strengthening.   Reviewed Eber Jones as noted in pt instructions for strengthening of R forearm.   Initiated R eccentric forearm strengthening as noted in pt instructions.   PATIENT EDUCATION: Education details: Estate agent; IASTM Person educated: Patient Education method: Explanation, Demonstration, Verbal cues, and Handouts Education comprehension: verbalized understanding, returned demonstration, and needs further education  HOME EXERCISE PROGRAM: 02/16/23 - Epicondylitis Conservative Treatment handout 03/01/23 - Lateral Epicondylitis Exercise Program - wrist flex/ext, Forearm pro/sup, grip and finger ext  GOALS: Goals reviewed with patient? Yes  SHORT TERM GOALS: Target date: 03/18/23  Patient will demonstrate ROM HEP with 25% verbal cues or less for proper execution. Baseline: New injury Goal status: MET  2.  Patient will demonstrate at least 75 lbs RUE grip strength as needed to open jars and other containers. Baseline: Right: 47.6, 54.0, 65.0 Average 55.5 lbs 03/15/2023: 96.7 lbs Goal status: MET  3.  Patient will report pain in the RUE <5/10 with use. Baseline: 10/10 with use 03/15/2023 - up to 5/10 pain with use Goal status: IN Progress  4.  Patient will be able to carry 10 lbs with elbow flexed without difficulty.  Baseline: Using L hand to carry briefcase. Goal status:  MET   LONG TERM GOALS: Target date: 04/15/23  Patient will demonstrate HEP including use of home based modalities with Mod I proper execution. Baseline: New Injury Goal status: IN Progress  2.  Patient will demonstrate at least 90+ lbs RUE grip strength. Baseline: Right: 47.6, 54.0, 65.0 Average 55.5 lbs 03/15/2023: 96.7 lbs Goal status: MET  3.  Patient will be able to lift 14 lb bowling ball to resume leisure activity and will report no more than mild difficulty. Baseline: unable to bowl per UEFI, 10/10 pain Goal status: INITIAL  4.  Patient will be aware of 3 joint protection, ergonomics, and body mechanic principles as needed to improve UE pain.  Baseline: 10/10 pain and unable to bowl. Goal status: MET   ASSESSMENT:  CLINICAL IMPRESSION: Patient demonstrates good tolerance to strengthening exercises this visit. Recommend pt try IASTM, and if this is helpful, will educate pt on how to complete at home for increased carry-over with pain management.   PERFORMANCE DEFICITS: in functional skills including IADLs, strength, pain, Gross motor control, and UE functional use.  IMPAIRMENTS: are limiting patient from IADLs and leisure.   COMORBIDITIES: may have co-morbidities  that affects occupational performance. Patient will benefit from skilled OT to address above impairments and improve overall function.  REHAB POTENTIAL: Excellent  PLAN:  OT FREQUENCY: 1-2x/week  OT DURATION: 6 weeks  PLANNED INTERVENTIONS: therapeutic exercise, therapeutic activity, manual therapy, electrical stimulation, ultrasound, iontophoresis, coping strategies training, and DME and/or AE instructions  RECOMMENDED OTHER SERVICES: NA  CONSULTED AND AGREED WITH PLAN OF CARE: Patient  PLANs FOR NEXT SESSION:  Rebounder; review forearm eccentrics; IASTM?  Modalities - ultrasound/estim; heat/ice Review stretching/HEP progression, isometrics,  Will need to explore modifications for bowling to prevent  further exacerbations Explore taping options.   Delana Meyer, OT 03/17/2023, 5:13 PM

## 2023-03-22 ENCOUNTER — Ambulatory Visit: Payer: 59 | Attending: Internal Medicine | Admitting: Occupational Therapy

## 2023-03-22 DIAGNOSIS — M7701 Medial epicondylitis, right elbow: Secondary | ICD-10-CM | POA: Insufficient documentation

## 2023-03-22 DIAGNOSIS — M79644 Pain in right finger(s): Secondary | ICD-10-CM | POA: Insufficient documentation

## 2023-03-22 DIAGNOSIS — M6281 Muscle weakness (generalized): Secondary | ICD-10-CM | POA: Diagnosis present

## 2023-03-22 DIAGNOSIS — M25521 Pain in right elbow: Secondary | ICD-10-CM | POA: Diagnosis present

## 2023-03-22 DIAGNOSIS — M7711 Lateral epicondylitis, right elbow: Secondary | ICD-10-CM | POA: Insufficient documentation

## 2023-03-22 NOTE — Telephone Encounter (Signed)
I will order a HST for now.

## 2023-03-22 NOTE — Therapy (Signed)
OUTPATIENT OCCUPATIONAL THERAPY ORTHO TREATMENT  Patient Name: Wayne Morales MRN: [id number] DOB:08/27/1970, 52 y.o., male Today's Date: 03/22/2023  PCP: Etta Grandchild, MD REFERRING PROVIDER: Etta Grandchild, MD  END OF SESSION:  OT End of Session - 03/22/23 1624     Visit Number 6    Number of Visits 12   + evaluation   Date for OT Re-Evaluation 04/08/23    Authorization Type UHC 2024 VL: 60 combined w/ PT&ST Auth Not Reqd    OT Start Time 1624    OT Stop Time 1702    OT Time Calculation (min) 38 min    Activity Tolerance Patient tolerated treatment well    Behavior During Therapy WFL for tasks assessed/performed             Past Medical History:  Diagnosis Date   Atrial fibrillation (HCC)    pt states they "figured it was not a-fib" but does have trouble with heart valve   Chronic back pain    Hyperlipidemia    diet controlled   Hypertension    on meds   Scalp psoriasis 12/15/2012   Past Surgical History:  Procedure Laterality Date   INGUINAL HERNIA REPAIR Left    as a child   Patient Active Problem List   Diagnosis Date Noted   Hyperglycemia 01/24/2023   Narcolepsy due to underlying 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 09/08/2022   Panic anxiety syndrome 05/19/2022   GERD (gastroesophageal reflux disease) 05/19/2022   GAD (generalized anxiety disorder) 05/17/2022   Drug-induced erectile dysfunction 06/02/2021   Encounter for general adult medical examination with abnormal findings 04/01/2021   Vitamin D deficiency disease 04/01/2021   Colon cancer screening 04/01/2021   Pure hyperglyceridemia 09/09/2017   Routine general medical examination at a health care facility 09/07/2017   Onychomycosis of great toe 09/07/2017   Protrusion of lumbar intervertebral disc    Essential  hypertension 07/08/2014   Scalp psoriasis 12/15/2012   Hyperlipidemia with target LDL less than 160 07/08/2008    ONSET DATE: 01/24/2023  REFERRING DIAG: M77.11 (ICD-10-CM) - Right tennis elbow  THERAPY DIAG:  Pain in right elbow  Muscle weakness (generalized)  Lateral epicondylitis of right elbow  Medial epicondylitis of elbow, right  Rationale for Evaluation and Treatment: Rehabilitation  SUBJECTIVE:   SUBJECTIVE STATEMENT:  Pt reports he used his BUE to pull a heavy object and to swim in the ocean. He feels these activities have increased his pain.   Pt accompanied by: self  PERTINENT HISTORY:  Medical Record Review: Recently, he has developed pain in his right forearm, described as shooting pain extending from the elbow, particularly when shaking hands or lifting objects weighing more than five pounds. The pain is located on the outside of the forearm and has been present for about a week and a half. He denies any known injury. He does repetitive activity (bowling).  PRECAUTIONS: None  WEIGHT BEARING RESTRICTIONS: No  PAIN:  Are you having pain? 4/10 - worst pain in last 24 hours 6/10  FALLS: Has patient fallen in last 6 months? Yes. 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  PATIENT GOALS: Get rid of the pain and increase strength.  NEXT MD VISIT: Not Scheduled  OBJECTIVE:   HAND DOMINANCE: Right  ADLs: Overall ADLs: Independent  FUNCTIONAL OUTCOME MEASURES: Upper Extremity Functional Scale (UEFS): 65  Difficulty noted with following activities: carrying objects including briefcase/computer  bag Participating in usual hobbies, recreational or sporting activities (ie bowling). Lifting a bag of groceries to waist level/ above head.  UPPER EXTREMITY ROM:     Active ROM - WNL throughout Right eval Left eval  Shoulder flexion    Shoulder abduction    Shoulder adduction    Shoulder extension    Shoulder internal rotation    Shoulder external rotation    Elbow flexion    Elbow extension    Wrist flexion Discomfort on back of hand   Wrist extension    Wrist ulnar deviation    Wrist radial deviation    Wrist pronation    Wrist supination    (Blank rows = not tested)   UPPER EXTREMITY MMT:     MMT Right eval Left eval  Shoulder flexion 5/5 5/5  Shoulder abduction 5/5 5/5  Shoulder adduction    Shoulder extension    Shoulder internal rotation    Shoulder external rotation    Middle trapezius    Lower trapezius    Elbow flexion 4-/5 - stops quickly  5/5  Elbow extension 4+/5 5/5  Wrist flexion    Wrist extension    Wrist ulnar deviation    Wrist radial deviation    Wrist pronation    Wrist supination    (Blank rows = not tested)  HAND FUNCTION: Grip strength: Right: 47.6, 54.0, 65.0  lbs; Left: 102.7, 109.7, 106.9  lbs, Lateral pinch: Right: 32 lbs, Left: 32 lbs, 3 point pinch: Right: 20 lbs, Left: 20,  lbs, and Tip pinch: Right 17 lbs, Left: 18 lbs  Grip Strength Average: Right: 55.5 lbs Left: 106.4 lbs  Pinching motions do not cause pain but gripping does.  COORDINATION: NT  SENSATION: WFL  EDEMA: NA  COGNITION: Overall cognitive status: Within functional limits for tasks assessed  OBSERVATIONS at Eval: Pt is a fit gentleman with no balance deficits or obvious coordination impairments who presents with palpable tenderness at the lateral epicondyle and obvious UE strength deficits of his dominant R UE compared to LUE.  TODAY'S TREATMENT:                                                                                                                                - Therapeutic exercises completed for duration as noted below including: With use of R, pt completed toss and catch of  4,  7, and 9 lb weighted balls at rebounder for proprioceptive input as needed to tolerate impact and force through this extremity.  Pt completed Eber Jones as noted in pt instructions for strengthening of R forearm.   Reviewed R eccentric forearm strengthening as noted in pt instructions. Pt required min cueing for proper completion.   Wrist flex and ext with tan flex bar x 2 min for strength and endurance of affected extremity  Supination with tan flex bar x 2 min for strength and endurance of affected extremity  Pronation with tan flex bar x 2 min for strength and endurance of affected extremity  - Ultrasound completed for duration as noted below including:  Ultrasound applied to dorsal right forearm and elbow for 8 minutes, frequency of 3 MHz, 20% duty cycle, and 1.1 W/cm with pt's arm placed on soft towel for promotion of ROM, edema reduction, and pain reduction in affected extremity.  PATIENT EDUCATION: Education details: Brewing technologist; strengthening; pain management Person educated: Patient Education method: Explanation, Demonstration, and Verbal cues Education comprehension: verbalized understanding, returned demonstration, and needs further education  HOME EXERCISE PROGRAM: 02/16/23 - Epicondylitis Conservative Treatment handout 03/01/23 - Lateral Epicondylitis Exercise Program - wrist flex/ext, Forearm pro/sup, grip and finger ext  GOALS: Goals reviewed with patient? Yes  SHORT TERM GOALS: Target date: 03/18/23  Patient will demonstrate ROM HEP with 25% verbal cues or less for proper execution. Baseline: New injury Goal status: MET  2.  Patient will demonstrate at least 75 lbs RUE grip strength as needed to open jars and other containers. Baseline: Right: 47.6, 54.0, 65.0 Average 55.5 lbs 03/15/2023: 96.7 lbs Goal status: MET  3.   Patient will report pain in the RUE <5/10 with use. Baseline: 10/10 with use 03/15/2023 - up to 5/10 pain with use Goal status: IN Progress  4.  Patient will be able to carry 10 lbs with elbow flexed without difficulty.  Baseline: Using L hand to carry briefcase. Goal status: MET   LONG TERM GOALS: Target date: 04/15/23  Patient will demonstrate HEP including use of home based modalities with Mod I proper execution. Baseline: New Injury Goal status: IN Progress  2.  Patient will demonstrate at least 90+ lbs RUE grip strength. Baseline: Right: 47.6, 54.0, 65.0 Average 55.5 lbs 03/15/2023: 96.7 lbs Goal status: MET  3.  Patient will be able to lift 14 lb bowling ball to resume leisure activity and will report no more than mild difficulty. Baseline: unable to bowl per UEFI, 10/10 pain Goal status: INITIAL  4.  Patient will be aware of 3 joint protection, ergonomics, and body mechanic principles as needed to improve UE pain.  Baseline: 10/10 pain and unable to bowl. Goal status: MET  ASSESSMENT:  CLINICAL IMPRESSION: Patient demonstrates good tolerance to strengthening exercises this visit despite report of acute pain flare. Pt continues to benefit from strengthening and activity modifications as needed to further progress towards goals.   PERFORMANCE DEFICITS: in functional skills including IADLs, strength, pain, Gross motor control, and UE functional use.  IMPAIRMENTS: are limiting patient from IADLs and leisure.   COMORBIDITIES: may have co-morbidities  that affects occupational performance. Patient will benefit from skilled OT to address above impairments and improve overall function.  REHAB POTENTIAL: Excellent  PLAN:  OT FREQUENCY: 1-2x/week  OT DURATION: 6 weeks  PLANNED INTERVENTIONS: therapeutic exercise, therapeutic activity, manual therapy, electrical stimulation, ultrasound, iontophoresis, coping strategies training, and DME and/or AE instructions  RECOMMENDED  OTHER SERVICES: NA  CONSULTED AND AGREED WITH PLAN OF CARE: Patient  PLANs FOR NEXT SESSION:  [place]?; review IASTM  Modalities - ultrasound/estim; heat/ice Review stretching/HEP  progression, isometrics,  Will need to explore modifications for bowling to prevent further exacerbations Explore taping options.   Delana Meyer, OT 03/22/2023, 5:09 PM

## 2023-03-22 NOTE — Telephone Encounter (Signed)
[employer]HC Denied NPSG/MSLT how would you like to proceed?

## 2023-03-22 NOTE — Telephone Encounter (Signed)
HST- [employer]HC no auth req.

## 2023-03-22 NOTE — Addendum Note (Signed)
Addended by: [name] on: 03/22/2023 08:48 AM   Modules accepted: Orders

## 2023-03-24 ENCOUNTER — Ambulatory Visit: Payer: 59 | Admitting: Occupational Therapy

## 2023-03-24 DIAGNOSIS — M6281 Muscle weakness (generalized): Secondary | ICD-10-CM

## 2023-03-24 DIAGNOSIS — M25521 Pain in right elbow: Secondary | ICD-10-CM | POA: Diagnosis not present

## 2023-03-24 DIAGNOSIS — M7701 Medial epicondylitis, right elbow: Secondary | ICD-10-CM

## 2023-03-24 DIAGNOSIS — M7711 Lateral epicondylitis, right elbow: Secondary | ICD-10-CM

## 2023-03-24 NOTE — Therapy (Signed)
OUTPATIENT OCCUPATIONAL THERAPY ORTHO TREATMENT  Patient Name: Wayne Morales MRN: [id number] DOB:October 26, 1970, 52 y.o., male Today's Date: 03/24/2023  PCP: Etta Grandchild, MD REFERRING PROVIDER: Etta Grandchild, MD  END OF SESSION:  OT End of Session - 03/24/23 1449     Visit Number 7    Number of Visits 12   + evaluation   Date for OT Re-Evaluation 04/08/23    Authorization Type UHC 2024 VL: 60 combined w/ PT&ST Auth Not Reqd    OT Start Time 1449    Activity Tolerance Patient tolerated treatment well    Behavior During Therapy [facility] for tasks assessed/performed             Past Medical History:  Diagnosis Date   Atrial fibrillation (HCC)    pt states they "figured it was not a-fib" but does have trouble with heart valve   Chronic back pain    Hyperlipidemia    diet controlled   Hypertension    on meds   Scalp psoriasis 12/15/2012   Past Surgical History:  Procedure Laterality Date   INGUINAL HERNIA REPAIR Left    as a child   Patient Active Problem List   Diagnosis Date Noted   Hyperglycemia 01/24/2023   Narcolepsy due to underlying 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 09/08/2022   Panic anxiety syndrome 05/19/2022   GERD (gastroesophageal reflux disease) 05/19/2022   GAD (generalized anxiety disorder) 05/17/2022   Drug-induced erectile dysfunction 06/02/2021   Encounter for general adult medical examination with abnormal findings 04/01/2021   Vitamin D deficiency disease 04/01/2021   Colon cancer screening 04/01/2021   Pure hyperglyceridemia 09/09/2017   Routine general medical examination at a health care facility 09/07/2017   Onychomycosis of great toe 09/07/2017   Protrusion of lumbar intervertebral disc    Essential hypertension 07/08/2014   Scalp psoriasis 12/15/2012    Hyperlipidemia with target LDL less than 160 07/08/2008    ONSET DATE: 01/24/2023  REFERRING DIAG: M77.11 (ICD-10-CM) - Right tennis elbow  THERAPY DIAG:  Pain in right elbow  Muscle weakness (generalized)  Lateral epicondylitis of right elbow  Medial epicondylitis of elbow, right  Rationale for Evaluation and Treatment: Rehabilitation  SUBJECTIVE:   SUBJECTIVE STATEMENT:  Pt reports he used his BUE to pull a heavy object and to swim in the ocean. He feels these activities have increased his pain.   Pt accompanied by: self  PERTINENT HISTORY:  Medical Record Review: Recently, he has developed pain in his right forearm, described as shooting pain extending from the elbow, particularly when shaking hands or lifting objects weighing more than five pounds. The pain is located on the outside of the forearm and has been present for about a week and a half. He denies any known injury. He does repetitive activity (bowling).  PRECAUTIONS: None  WEIGHT BEARING RESTRICTIONS: No  PAIN:  Are you having pain? 4/10 - worst pain in last 24 hours 6/10  FALLS: Has patient fallen in last 6 months? Yes. 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  PATIENT GOALS: Get rid of the pain and increase strength.  NEXT MD VISIT: Not Scheduled  OBJECTIVE:   HAND DOMINANCE: Right  ADLs: Overall ADLs: Independent  FUNCTIONAL OUTCOME MEASURES: Upper Extremity Functional Scale (UEFS): 65  Difficulty noted with following activities: carrying objects including briefcase/computer bag Participating in usual hobbies, recreational or sporting  activities (ie bowling). Lifting a bag of groceries to waist level/ above head.  UPPER EXTREMITY ROM:     Active ROM - WNL throughout Right eval Left eval  Shoulder flexion    Shoulder abduction    Shoulder adduction    Shoulder extension    Shoulder internal rotation    Shoulder external rotation    Elbow flexion    Elbow extension    Wrist flexion Discomfort on back of hand   Wrist extension    Wrist ulnar deviation    Wrist radial deviation    Wrist pronation    Wrist supination    (Blank rows = not tested)   UPPER EXTREMITY MMT:     MMT Right eval Left eval  Shoulder flexion 5/5 5/5  Shoulder abduction 5/5 5/5  Shoulder adduction    Shoulder extension    Shoulder internal rotation    Shoulder external rotation    Middle trapezius    Lower trapezius    Elbow flexion 4-/5 - stops quickly  5/5  Elbow extension 4+/5 5/5  Wrist flexion    Wrist extension    Wrist ulnar deviation    Wrist radial deviation    Wrist pronation    Wrist supination    (Blank rows = not tested)  HAND FUNCTION: Grip strength: Right: 47.6, 54.0, 65.0  lbs; Left: 102.7, 109.7, 106.9  lbs, Lateral pinch: Right: 32 lbs, Left: 32 lbs, 3 point pinch: Right: 20 lbs, Left: 20,  lbs, and Tip pinch: Right 17 lbs, Left: 18 lbs  Grip Strength Average: Right: 55.5 lbs Left: 106.4 lbs  Pinching motions do not cause pain but gripping does.  COORDINATION: NT  SENSATION: WFL  EDEMA: NA  COGNITION: Overall cognitive status: Within functional limits for tasks assessed  OBSERVATIONS at Eval: Pt is a fit gentleman with no balance deficits or obvious coordination impairments who presents with palpable tenderness at the lateral epicondyle and obvious UE strength deficits of his dominant R UE compared to LUE.  TODAY'S TREATMENT:                                                                                                                               - Therapeutic exercises completed for  duration as noted below including: Pt completed arm bike in sitting for 5 minutes with average RPM of 30 at level 7 for endurance, ROM, and strengthening of affected extremity. Pt alternating direction of pedaling halfway through. Intermittent cues provided to maintain stability with respect to anterior/posterior trunk lean and consistent grasp maintenance. Pt completed  slam ball for 2 min with 10 lb ball for strengthening and tolerance to force/impact.  Pt completed simulated bowling ball rolls with use of 10 lb medicine ball for strengthening and tolerance to force/impact.  Pt completed wrist winder with 3 lb ball x 5 reps for strengthening with cues not to deviate R wrist ulnarly or radially.   - Ultrasound completed for duration as noted below including:  Ultrasound applied to dorsal right forearm and elbow using large sound head for 8 minutes, frequency of 3 MHz, 20% duty cycle, and 1.1 W/cm with pt's arm placed on soft towel for promotion of ROM, edema reduction, and pain reduction in affected extremity.  PATIENT EDUCATION: Education details: Brewing technologist; strengthening; pain management Person educated: Patient Education method: Explanation, Demonstration, and Verbal cues Education comprehension: verbalized understanding, returned demonstration, and needs further education  HOME EXERCISE PROGRAM: 02/16/23 - Epicondylitis Conservative Treatment handout 03/01/23 - Lateral Epicondylitis Exercise Program - wrist flex/ext, Forearm pro/sup, grip and finger ext  GOALS: Goals reviewed with patient? Yes  SHORT TERM GOALS: Target date: 03/18/23  Patient will demonstrate ROM HEP with 25% verbal cues or less for proper execution. Baseline: New injury Goal status: MET  2.  Patient will demonstrate at least 75 lbs RUE grip strength as needed to open jars and other containers. Baseline: Right: 47.6, 54.0, 65.0 Average 55.5 lbs 03/15/2023: 96.7 lbs Goal status: MET  3.  Patient will report pain  in the RUE <5/10 with use. Baseline: 10/10 with use 03/15/2023 - up to 5/10 pain with use Goal status: IN Progress  4.  Patient will be able to carry 10 lbs with elbow flexed without difficulty.  Baseline: Using L hand to carry briefcase. Goal status: MET   LONG TERM GOALS: Target date: 04/15/23  Patient will demonstrate HEP including use of home based modalities with Mod I proper execution. Baseline: New Injury Goal status: IN Progress  2.  Patient will demonstrate at least 90+ lbs RUE grip strength. Baseline: Right: 47.6, 54.0, 65.0 Average 55.5 lbs 03/15/2023: 96.7 lbs Goal status: MET  3.  Patient will be able to lift 14 lb bowling ball to resume leisure activity and will report no more than mild difficulty. Baseline: unable to bowl per UEFI, 10/10 pain Goal status: INITIAL  4.  Patient will be aware of 3 joint protection, ergonomics, and body mechanic principles as needed to improve UE pain.  Baseline: 10/10 pain and unable to bowl. Goal status: MET  ASSESSMENT:  CLINICAL IMPRESSION: Patient demonstrates good tolerance to strengthening exercises this visit. Pt continues to benefit from strengthening and activity modifications as needed to further progress towards goals. Will likely be appropriate for d/c in the near future.   PERFORMANCE DEFICITS: in functional skills including IADLs, strength, pain, Gross motor control, and UE functional use.  IMPAIRMENTS: are limiting patient from IADLs and leisure.   COMORBIDITIES: may have co-morbidities  that affects occupational performance. Patient will benefit from skilled OT to address above impairments and improve overall function.  REHAB POTENTIAL: Excellent  PLAN:  OT FREQUENCY: 1-2x/week  OT DURATION: 6 weeks  PLANNED INTERVENTIONS: therapeutic exercise, therapeutic activity, manual therapy, electrical stimulation, ultrasound, iontophoresis, coping strategies training, and DME and/or AE instructions  RECOMMENDED OTHER  SERVICES: NA  CONSULTED AND AGREED WITH PLAN OF CARE: Patient  PLANs FOR NEXT SESSION:  review IASTM (did he get his tools?)  Modalities - ultrasound/estim; heat/ice Review stretching/HEP progression, isometrics,  Will need to explore modifications for bowling to prevent further  exacerbations Explore taping options.   Delana Meyer, OT 03/24/2023, 2:56 PM

## 2023-03-24 NOTE — Telephone Encounter (Signed)
HST- [employer]HC no auth req   Patient is scheduled at [facility] for 04/05/23 at 1 pm  Mailed packet to the patient.

## 2023-03-27 ENCOUNTER — Other Ambulatory Visit: Payer: Self-pay | Admitting: Cardiology

## 2023-03-27 DIAGNOSIS — E781 Pure hyperglyceridemia: Secondary | ICD-10-CM

## 2023-03-27 DIAGNOSIS — R931 Abnormal findings on diagnostic imaging of heart and coronary circulation: Secondary | ICD-10-CM

## 2023-03-27 DIAGNOSIS — R002 Palpitations: Secondary | ICD-10-CM

## 2023-03-27 DIAGNOSIS — I10 Essential (primary) hypertension: Secondary | ICD-10-CM

## 2023-03-28 ENCOUNTER — Other Ambulatory Visit: Payer: Self-pay | Admitting: Cardiology

## 2023-03-28 DIAGNOSIS — I10 Essential (primary) hypertension: Secondary | ICD-10-CM

## 2023-03-29 ENCOUNTER — Ambulatory Visit: Payer: 59 | Admitting: Occupational Therapy

## 2023-03-29 DIAGNOSIS — M6281 Muscle weakness (generalized): Secondary | ICD-10-CM

## 2023-03-29 DIAGNOSIS — M25521 Pain in right elbow: Secondary | ICD-10-CM | POA: Diagnosis not present

## 2023-03-29 DIAGNOSIS — M7711 Lateral epicondylitis, right elbow: Secondary | ICD-10-CM

## 2023-03-29 DIAGNOSIS — M7701 Medial epicondylitis, right elbow: Secondary | ICD-10-CM

## 2023-03-29 NOTE — Therapy (Signed)
OUTPATIENT OCCUPATIONAL THERAPY ORTHO TREATMENT  Patient Name: Wayne Morales MRN: [id number] DOB:07-Apr-1971, 51 y.o., male Today's Date: 03/29/2023  PCP: Etta Grandchild, MD REFERRING PROVIDER: Etta Grandchild, MD  END OF SESSION:  OT End of Session - 03/29/23 1538     Visit Number 8    Number of Visits 12   + evaluation   Date for OT Re-Evaluation 04/08/23    Authorization Type U[employer] 2024 VL: 60 combined w/ PT&ST Auth Not Reqd    OT Start Time 1537    OT Stop Time 1622    OT Time Calculation (min) 45 min    Equipment Utilized During Treatment ultrasound, UBE, free weight    Activity Tolerance Patient tolerated treatment well    Behavior During Therapy WFL for tasks assessed/performed             Past Medical History:  Diagnosis Date   Atrial fibrillation (HCC)    pt states they "figured it was not a-fib" but does have trouble with heart valve   Chronic back pain    Hyperlipidemia    diet controlled   Hypertension    on meds   Scalp psoriasis 12/15/2012   Past Surgical History:  Procedure Laterality Date   INGUINAL HERNIA REPAIR Left    as a child   Patient Active Problem List   Diagnosis Date Noted   Hyperglycemia 01/24/2023   Narcolepsy due to underlying 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 09/08/2022   Panic anxiety syndrome 05/19/2022   GERD (gastroesophageal reflux disease) 05/19/2022   GAD (generalized anxiety disorder) 05/17/2022   Drug-induced erectile dysfunction 06/02/2021   Encounter for general adult medical examination with abnormal findings 04/01/2021   Vitamin D deficiency disease 04/01/2021   Colon cancer screening 04/01/2021   Pure hyperglyceridemia 09/09/2017   Routine general medical examination at a health care facility 09/07/2017   Onychomycosis of great toe  09/07/2017   Protrusion of lumbar intervertebral disc    Essential hypertension 07/08/2014   Scalp psoriasis 12/15/2012   Hyperlipidemia with target LDL less than 160 07/08/2008    ONSET DATE: 01/24/2023  REFERRING DIAG: M77.11 (ICD-10-CM) - Right tennis elbow  THERAPY DIAG:  Pain in right elbow  Lateral epicondylitis of right elbow  Medial epicondylitis of elbow, right  Muscle weakness (generalized)  Rationale for Evaluation and Treatment: Rehabilitation  SUBJECTIVE:   SUBJECTIVE STATEMENT:  Pt reports his arm is more achy today rather than painful like last week.  Pain in the right hand/thumb has diminished and the discomfort is more in the elbow.  Pt accompanied by: self  PERTINENT HISTORY:  Medical Record Review: Recently, he has developed pain in his right forearm, described as shooting pain extending from the elbow, particularly when shaking hands or lifting objects weighing more than five pounds. The pain is located on the outside of the forearm and has been present for about a week and a half. He denies any known injury. He does repetitive activity (bowling).  PRECAUTIONS: None  WEIGHT BEARING RESTRICTIONS: No  PAIN:  Are you having pain? Not upon arrival to therapy, with use pain is around 3/10 - worst pain over the weekend 5/10  FALLS: Has patient fallen in last 6 months? Yes. 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  PATIENT GOALS: Get rid of the pain and increase strength.  NEXT MD VISIT: Not Scheduled  OBJECTIVE:   HAND DOMINANCE: Right  ADLs: Overall ADLs: Independent  FUNCTIONAL OUTCOME  MEASURES: Upper Extremity Functional Scale (UEFS): 65  Difficulty noted with following activities: carrying objects including briefcase/computer bag Participating in usual hobbies, recreational or sporting activities (ie bowling). Lifting a bag of groceries to waist level/ above head.  UPPER EXTREMITY ROM:     Active ROM - WNL throughout Right eval Left eval  Shoulder flexion    Shoulder abduction    Shoulder adduction    Shoulder extension    Shoulder internal rotation    Shoulder external rotation    Elbow flexion    Elbow extension    Wrist flexion Discomfort on back of hand   Wrist extension    Wrist ulnar deviation    Wrist radial deviation    Wrist pronation    Wrist supination    (Blank rows = not tested)   UPPER EXTREMITY MMT:     MMT Right eval Left eval  Shoulder flexion 5/5 5/5  Shoulder abduction 5/5 5/5  Shoulder adduction    Shoulder extension    Shoulder internal rotation    Shoulder external rotation    Middle trapezius    Lower trapezius    Elbow flexion 4-/5 - stops quickly  5/5  Elbow extension 4+/5 5/5  Wrist flexion    Wrist extension    Wrist ulnar deviation    Wrist radial deviation    Wrist pronation    Wrist supination    (Blank rows = not tested)  HAND FUNCTION: Grip strength: Right: 47.6, 54.0, 65.0  lbs; Left: 102.7, 109.7, 106.9  lbs, Lateral pinch: Right: 32 lbs, Left: 32 lbs, 3 point pinch: Right: 20 lbs, Left: 20,  lbs, and Tip pinch: Right 17 lbs, Left: 18 lbs  Grip Strength Average: Right: 55.5 lbs Left: 106.4 lbs  Pinching motions do not cause pain but gripping does.  COORDINATION: NT  SENSATION: WFL  EDEMA: NA  COGNITION: Overall cognitive status: Within functional limits for tasks assessed  OBSERVATIONS at Eval: Pt is a fit gentleman with no balance deficits or obvious coordination impairments who presents with palpable tenderness at the lateral epicondyle and obvious UE strength deficits of his dominant R UE  compared to LUE.  TODAY'S TREATMENT:                                                                                                                               - Therapeutic exercises completed for duration as noted below including: Pt completed arm bike in sitting for 6 minutes with  average RPM of 35 at level 5 for endurance, ROM, and strengthening of affected extremity. Pt alternating direction of pedaling halfway through. Pt reports increased awareness of elbow discomfort with backwards motion on bike. Pt completed eccentric forearm strengthening per previous patient instructions with 2 lb weight x 20 second count for the following motions x 10 repetitions each: -wrist extension against gravity with no discomfort  -wrist flexion against gravity with minor discomfort in ulnar region with flexion past neutral  -radial deviation (with forearm in neutral on elevated pad) against gravity without discomfort  -ulnar deviation with hand up (elbow on padded table top) with no difficulty or discomfort either  He is encouraged to add the radial/ulnar deviation motions to his home routine.  - Ultrasound completed for duration as noted below including:  Ultrasound applied to dorsal right forearm and elbow using large sound head for 10 minutes, frequency of 3 MHz, 20% duty cycle, and 1.1 W/cm with pt's arm placed on soft towel for promotion of ROM, edema reduction, and pain reduction in affected extremity.  PATIENT EDUCATION: Education details: Stage manager Person educated: Patient Education method: Explanation, Demonstration, and Verbal cues Education comprehension: verbalized understanding, returned demonstration, and needs further education  HOME EXERCISE PROGRAM: 02/16/23 - Epicondylitis Conservative Treatment handout 03/01/23 - Lateral Epicondylitis Exercise Program - wrist flex/ext, Forearm pro/sup, grip and finger ext 03/09/23 - Ice massage & Medial Epicondylitis Exercises (active  strengthening) 03/15/23 - Sleep positions, Joint protection Principles 03/17/23 - Eccentric Forearm Strengthening   GOALS: Goals reviewed with patient? Yes  SHORT TERM GOALS: Target date: 03/18/23  Patient will demonstrate ROM HEP with 25% verbal cues or less for proper execution. Baseline: New injury Goal status: MET  2.  Patient will demonstrate at least 75 lbs RUE grip strength as needed to open jars and other containers. Baseline: Right: 47.6, 54.0, 65.0 Average 55.5 lbs 03/15/2023: 96.7 lbs Goal status: MET  3.  Patient will report pain in the RUE <5/10 with use. Baseline: 10/10 with use 03/15/2023 - up to 5/10 pain with use Goal status: IN Progress  4.  Patient will be able to carry 10 lbs with elbow flexed without difficulty.  Baseline: Using L hand to carry briefcase. Goal status: MET   LONG TERM GOALS: Target date: 04/15/23  Patient will demonstrate HEP including use of home based modalities with Mod I proper execution. Baseline: New Injury Goal status: IN Progress  2.  Patient will demonstrate at least 90+ lbs RUE grip strength. Baseline: Right: 47.6, 54.0, 65.0 Average 55.5 lbs 03/15/2023: 96.7 lbs Goal status: MET  3.  Patient will be able to lift 14 lb bowling ball to resume leisure activity and will report no more than mild difficulty. Baseline: unable to bowl per UEFI, 10/10 pain Goal status: IN Progress  4.  Patient will be aware of 3 joint protection, ergonomics, and body mechanic principles as needed to improve UE pain.  Baseline: 10/10 pain and unable to bowl. Goal status: MET  ASSESSMENT:  CLINICAL IMPRESSION: Patient demonstrates good tolerance to ongoing use of modalities and strengthening exercises this visit. Pt continues to work on strengthening activities at home and ongoing activity modification recommendations are provided ie) considering wrist/elbow brace to help with fishing and bowling as leisure activities. Skilled OT services to continue to  maximize functional use of R UE with decreased pain for resuming leisure activities. Will likely be appropriate for d/c in the near future.   PERFORMANCE DEFICITS: in functional skills including IADLs, strength,  pain, Gross motor control, and UE functional use.  IMPAIRMENTS: are limiting patient from IADLs and leisure.   COMORBIDITIES: may have co-morbidities  that affects occupational performance. Patient will benefit from skilled OT to address above impairments and improve overall function.  REHAB POTENTIAL: Excellent  PLAN:  OT FREQUENCY: 1-2x/week  OT DURATION: 6 weeks  PLANNED INTERVENTIONS: therapeutic exercise, therapeutic activity, manual therapy, electrical stimulation, ultrasound, iontophoresis, coping strategies training, and DME and/or AE instructions  RECOMMENDED OTHER SERVICES: NA  CONSULTED AND AGREED WITH PLAN OF CARE: Patient  PLANs FOR NEXT SESSION:  review IASTM (he did get his tools) Check on tennis elbow strap & what about a bowling brace Home Modality options vs therapy - ultrasound/estim; heat/ice Review stretching/HEP progression, isometrics,  Will need to explore modifications for bowling to prevent further exacerbations Explore taping options.   Victorino Sparrow, OT 03/29/2023, 4:44 PM

## 2023-03-30 ENCOUNTER — Ambulatory Visit: Payer: 59 | Admitting: Occupational Therapy

## 2023-03-30 DIAGNOSIS — M25521 Pain in right elbow: Secondary | ICD-10-CM | POA: Diagnosis not present

## 2023-03-30 DIAGNOSIS — M7711 Lateral epicondylitis, right elbow: Secondary | ICD-10-CM

## 2023-03-30 DIAGNOSIS — M6281 Muscle weakness (generalized): Secondary | ICD-10-CM

## 2023-03-30 DIAGNOSIS — M79644 Pain in right finger(s): Secondary | ICD-10-CM

## 2023-03-30 NOTE — Therapy (Unsigned)
OUTPATIENT OCCUPATIONAL THERAPY ORTHO TREATMENT  Patient Name: Wayne Morales MRN: [id number] DOB:Jul 23, 1970, 52 y.o., male Today's Date: 03/30/2023  PCP: Etta Grandchild, MD REFERRING PROVIDER: Etta Grandchild, MD  END OF SESSION:  OT End of Session - 03/30/23 1532     Visit Number 9    Number of Visits 12   + evaluation   Date for OT Re-Evaluation 04/08/23    Authorization Type UHC 2024 VL: 60 combined w/ PT&ST Auth Not Reqd    OT Start Time 1533    OT Stop Time 1615    OT Time Calculation (min) 42 min    Equipment Utilized During Treatment ultrasound, UBE, free weight    Activity Tolerance Patient tolerated treatment well    Behavior During Therapy WFL for tasks assessed/performed             Past Medical History:  Diagnosis Date   Atrial fibrillation (HCC)    pt states they "figured it was not a-fib" but does have trouble with heart valve   Chronic back pain    Hyperlipidemia    diet controlled   Hypertension    on meds   Scalp psoriasis 12/15/2012   Past Surgical History:  Procedure Laterality Date   INGUINAL HERNIA REPAIR Left    as a child   Patient Active Problem List   Diagnosis Date Noted   Hyperglycemia 01/24/2023   Narcolepsy due to underlying 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 09/08/2022   Panic anxiety syndrome 05/19/2022   GERD (gastroesophageal reflux disease) 05/19/2022   GAD (generalized anxiety disorder) 05/17/2022   Drug-induced erectile dysfunction 06/02/2021   Encounter for general adult medical examination with abnormal findings 04/01/2021   Vitamin D deficiency disease 04/01/2021   Colon cancer screening 04/01/2021   Pure hyperglyceridemia 09/09/2017   Routine general medical examination at a health care facility 09/07/2017   Onychomycosis of great toe  09/07/2017   Protrusion of lumbar intervertebral disc    Essential hypertension 07/08/2014   Scalp psoriasis 12/15/2012   Hyperlipidemia with target LDL less than 160 07/08/2008    ONSET DATE: 01/24/2023  REFERRING DIAG: M77.11 (ICD-10-CM) - Right tennis elbow  THERAPY DIAG:  Pain in right elbow  Pain of right thumb  Muscle weakness (generalized)  Lateral epicondylitis of right elbow  Rationale for Evaluation and Treatment: Rehabilitation  SUBJECTIVE:   SUBJECTIVE STATEMENT:  Pt reports his arm is better today but his hand is more sore today ie) in thumb webspace against the side of the index phalange.  He has ordered several splints to try - hand, elbow and tennis elbow wrap.   Pt accompanied by: self  PERTINENT HISTORY:  Medical Record Review: Recently, he has developed pain in his right forearm, described as shooting pain extending from the elbow, particularly when shaking hands or lifting objects weighing more than five pounds. The pain is located on the outside of the forearm and has been present for about a week and a half. He denies any known injury. He does repetitive activity (bowling).  PRECAUTIONS: None  WEIGHT BEARING RESTRICTIONS: No  PAIN:  Are you having pain? Not upon arrival to therapy, with use pain is around 3/10 - worst pain over the weekend 5/10  FALLS: Has patient fallen in last 6 months? Yes. 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  PATIENT GOALS: Get rid of the pain and increase strength.  NEXT MD VISIT: Not Scheduled  OBJECTIVE:   HAND DOMINANCE: Right  ADLs: Overall ADLs:  Independent  FUNCTIONAL OUTCOME MEASURES: Upper Extremity Functional Scale (UEFS): 65  Difficulty noted with following activities: carrying objects including briefcase/computer bag Participating in usual hobbies, recreational or sporting activities (ie bowling). Lifting a bag of groceries to waist level/ above head.  UPPER EXTREMITY ROM:     Active ROM - WNL throughout Right eval Left eval  Shoulder flexion    Shoulder abduction    Shoulder adduction    Shoulder extension    Shoulder internal rotation    Shoulder external rotation    Elbow flexion    Elbow extension    Wrist flexion Discomfort on back of hand   Wrist extension    Wrist ulnar deviation    Wrist radial deviation    Wrist pronation    Wrist supination    (Blank rows = not tested)   UPPER EXTREMITY MMT:     MMT Right eval Left eval  Shoulder flexion 5/5 5/5  Shoulder abduction 5/5 5/5  Shoulder adduction    Shoulder extension    Shoulder internal rotation    Shoulder external rotation    Middle trapezius    Lower trapezius    Elbow flexion 4-/5 - stops quickly  5/5  Elbow extension 4+/5 5/5  Wrist flexion    Wrist extension    Wrist ulnar deviation    Wrist radial deviation    Wrist pronation    Wrist supination    (Blank rows = not tested)  HAND FUNCTION: Grip strength: Right: 47.6, 54.0, 65.0  lbs; Left: 102.7, 109.7, 106.9  lbs, Lateral pinch: Right: 32 lbs, Left: 32 lbs, 3 point pinch: Right: 20 lbs, Left: 20,  lbs, and Tip pinch: Right 17 lbs, Left: 18 lbs  Grip Strength Average: Right: 55.5 lbs Left: 106.4 lbs  Pinching motions do not cause pain but gripping does.  COORDINATION: NT  SENSATION: WFL  EDEMA: NA  COGNITION: Overall cognitive status: Within functional limits for tasks assessed  OBSERVATIONS at Eval: Pt is a fit gentleman with no balance deficits or obvious coordination impairments who presents with palpable tenderness at the lateral epicondyle and obvious UE  strength deficits of his dominant R UE compared to LUE.  TODAY'S TREATMENT:                                                                                                                               - Therapeutic exercises completed for duration as noted below including:  Wrist flexion via rolling tan flex bar forward x 10-15 reps for strength and endurance of RUE extremity. Wrist extension via roling tan flex bar backwards x 10-15 reps for strength and endurance of RUE extremity. Supination with tan flex bar x 10 reps at 10-20 second holds first using 2 hands and then using one hand and bar on table top for strength and endurance of affected extremity Pronation with tan flex bar x 10 reps at 10-20 second holds first using 2 hands and then using one hand and bar on table top for strength and endurance of affected extremity  Wrist radial deviation pushing the tan flex bar forward on the table  via rolling tan flex bar forward x 10-15 reps for strength and endurance of RUE extremity. Wrist radial deviation pushing the tan flex bar forward on the table  via rolling tan flex bar forward x 10-15 reps for strength and endurance of RUE extremity.  Weightbearing and hand shaking  Exercises - Wrist Flexion and Extension with Resistance Bar  - 1 x daily - 10 reps - Forearm Supination with Resistance Bar  - 1 x daily - 10 reps - Forearm Pronation with Resistance Bar  - 1 x daily - 10 reps - Wrist Radial Deviation with Resistance Bar  - 1 x daily - 10 reps - Wrist Ulnar Deviation with Resistance Bar  - 1 x daily - 10 reps  He is encouraged to add the radial/ulnar deviation motions to his home routine.  - Ultrasound completed for duration as noted below including:  Ultrasound applied to dorsal right hand/thumb using small sound head for 10 minutes, frequency of 3 MHz, 20% duty cycle, and 1.1 W/cm with pt's arm placed on soft towel for promotion of ROM, edema reduction, and pain reduction in affected  extremity.  PATIENT EDUCATION: Education details: Stage manager Person educated: Patient Education method: Explanation, Demonstration, and Verbal cues Education comprehension: verbalized understanding, returned demonstration, and needs further education  HOME EXERCISE PROGRAM: 02/16/23 - Epicondylitis Conservative Treatment handout 03/01/23 - Lateral Epicondylitis Exercise Program - wrist flex/ext, Forearm pro/sup, grip and finger ext 03/09/23 - Ice massage & Medial Epicondylitis Exercises (active strengthening) 03/15/23 - Sleep positions, Joint protection Principles 03/17/23 - Eccentric Forearm Strengthening 03/30/23 - Access Code: [email or web address] - resistance bar   GOALS: Goals reviewed with patient? Yes  SHORT TERM GOALS: Target date: 03/18/23  Patient will demonstrate ROM HEP with 25% verbal cues or less for proper execution. Baseline: New injury Goal status: MET  2.  Patient will demonstrate at least 75 lbs RUE grip strength as needed to open jars and other containers. Baseline: Right: 47.6, 54.0, 65.0 Average 55.5 lbs 03/15/2023: 96.7 lbs Goal status: MET  3.  Patient will report pain in the RUE <5/10 with use. Baseline: 10/10 with use 03/15/2023 - up to 5/10 pain with use Goal status: IN Progress  4.  Patient will be able to carry 10 lbs with elbow flexed without difficulty.  Baseline: Using L hand to carry briefcase. Goal status: MET   LONG TERM GOALS: Target date: 04/15/23  Patient will demonstrate HEP including use of home based modalities with Mod I proper execution. Baseline: New Injury Goal status: IN Progress  2.  Patient will demonstrate at least 90+ lbs RUE grip strength. Baseline: Right: 47.6, 54.0, 65.0 Average 55.5 lbs 03/15/2023: 96.7 lbs Goal status: MET  3.  Patient will be able to lift 14 lb bowling ball to resume leisure activity and will report no more  than mild difficulty. Baseline: unable to bowl per UEFI, 10/10 pain Goal status: IN  Progress  4.  Patient will be aware of 3 joint protection, ergonomics, and body mechanic principles as needed to improve UE pain.  Baseline: 10/10 pain and unable to bowl. Goal status: MET  ASSESSMENT:  CLINICAL IMPRESSION: Patient demonstrates good tolerance to ongoing use of modalities and strengthening exercises this visit. Pt continues to work on strengthening activities at home and ongoing activity modification recommendations are provided ie) considering wrist/elbow brace to help with fishing and bowling as leisure activities. Skilled OT services to continue to maximize functional use of R UE with decreased pain for resuming leisure activities. Will likely be appropriate for d/c in the near future.   PERFORMANCE DEFICITS: in functional skills including IADLs, strength, pain, Gross motor control, and UE functional use.  IMPAIRMENTS: are limiting patient from IADLs and leisure.   COMORBIDITIES: may have co-morbidities  that affects occupational performance. Patient will benefit from skilled OT to address above impairments and improve overall function.  REHAB POTENTIAL: Excellent  PLAN:  OT FREQUENCY: 1-2x/week  OT DURATION: 6 weeks  PLANNED INTERVENTIONS: therapeutic exercise, therapeutic activity, manual therapy, electrical stimulation, ultrasound, iontophoresis, coping strategies training, and DME and/or AE instructions  RECOMMENDED OTHER SERVICES: NA  CONSULTED AND AGREED WITH PLAN OF CARE: Patient  PLANs FOR NEXT SESSION:  review IASTM (he did get his tools) Check on tennis elbow strap & what about a bowling brace Home Modality options vs therapy - ultrasound/estim; heat/ice Review stretching/HEP progression, isometrics,  Will need to explore modifications for bowling to prevent further exacerbations Explore taping options.   Victorino Sparrow, OT 03/30/2023, 5:18 PM

## 2023-03-30 NOTE — Patient Instructions (Signed)
Resistance Bar Exercises  Access Code: [email or web address] URL: [email or web address] Date: 03/30/2023 Prepared by: Amada Kingfisher  Exercises - Wrist Flexion and Extension with Resistance Bar  - 1 x daily - 10 reps - Forearm Supination with Resistance Bar  - 1 x daily - 10 reps - Forearm Pronation with Resistance Bar  - 1 x daily - 10 reps - Wrist Radial Deviation with Resistance Bar  - 1 x daily - 10 reps - Wrist Ulnar Deviation with Resistance Bar  - 1 x daily - 10 reps

## 2023-04-05 ENCOUNTER — Ambulatory Visit: Payer: 59 | Admitting: Occupational Therapy

## 2023-04-05 ENCOUNTER — Ambulatory Visit: Payer: 59 | Admitting: Neurology

## 2023-04-05 DIAGNOSIS — R0681 Apnea, not elsewhere classified: Secondary | ICD-10-CM

## 2023-04-05 DIAGNOSIS — G2581 Restless legs syndrome: Secondary | ICD-10-CM

## 2023-04-05 DIAGNOSIS — G4719 Other hypersomnia: Secondary | ICD-10-CM

## 2023-04-05 DIAGNOSIS — G4733 Obstructive sleep apnea (adult) (pediatric): Secondary | ICD-10-CM

## 2023-04-05 DIAGNOSIS — G4710 Hypersomnia, unspecified: Secondary | ICD-10-CM

## 2023-04-05 DIAGNOSIS — G478 Other sleep disorders: Secondary | ICD-10-CM

## 2023-04-05 DIAGNOSIS — R0683 Snoring: Secondary | ICD-10-CM

## 2023-04-05 DIAGNOSIS — R6889 Other general symptoms and signs: Secondary | ICD-10-CM

## 2023-04-05 DIAGNOSIS — R400 Somnolence: Secondary | ICD-10-CM

## 2023-04-05 NOTE — Therapy (Deleted)
OUTPATIENT OCCUPATIONAL THERAPY ORTHO TREATMENT  Patient Name: Wayne Morales MRN: [id number] DOB:May 04, 1971, 52 y.o., male Today's Date: 04/05/2023  PCP: Etta Grandchild, MD REFERRING PROVIDER: Etta Grandchild, MD  END OF SESSION:    Past Medical History:  Diagnosis Date   Atrial fibrillation [facility])    pt states they "figured it was not a-fib" but does have trouble with heart valve   Chronic back pain    Hyperlipidemia    diet controlled   Hypertension    on meds   Scalp psoriasis 12/15/2012   Past Surgical History:  Procedure Laterality Date   INGUINAL HERNIA REPAIR Left    as a child   Patient Active Problem List   Diagnosis Date Noted   Hyperglycemia 01/24/2023   Narcolepsy due to underlying 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 09/08/2022   Panic anxiety syndrome 05/19/2022   GERD (gastroesophageal reflux disease) 05/19/2022   GAD (generalized anxiety disorder) 05/17/2022   Drug-induced erectile dysfunction 06/02/2021   Encounter for general adult medical examination with abnormal findings 04/01/2021   Vitamin D deficiency disease 04/01/2021   Colon cancer screening 04/01/2021   Pure hyperglyceridemia 09/09/2017   Routine general medical examination at a health care facility 09/07/2017   Onychomycosis of great toe 09/07/2017   Protrusion of lumbar intervertebral disc    Essential hypertension 07/08/2014   Scalp psoriasis 12/15/2012   Hyperlipidemia with target LDL less than 160 07/08/2008    ONSET DATE: 01/24/2023  REFERRING DIAG: M77.11 (ICD-10-CM) - Right tennis elbow  THERAPY DIAG:  No diagnosis found.  Rationale for Evaluation and Treatment: Rehabilitation  SUBJECTIVE:   SUBJECTIVE STATEMENT:  Pt reports his arm is better today but his hand is more sore today ie) in  thumb webspace against the side of the index phalange.  He woke up that way and it has remained throughout the day.  He has ordered several splints to try - hand, elbow and tennis elbow wrap.   Pt accompanied by: self  PERTINENT HISTORY:  Medical Record Review: Recently, he has developed pain in his right forearm, described as shooting pain extending from the elbow, particularly when shaking hands or lifting objects weighing more than five pounds. The pain is located on the outside of the forearm and has been present for about a week and a half. He denies any known injury. He does repetitive activity (bowling).  PRECAUTIONS: None  WEIGHT BEARING RESTRICTIONS: No  PAIN:  Are you having pain? Not upon arrival to therapy, with use pain is around 3/10 - worst pain over the weekend 5/10  FALLS: Has patient fallen in last 6 months? Yes. 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  PATIENT GOALS: Get rid of the pain and increase strength.  NEXT MD VISIT: Not Scheduled  OBJECTIVE:   HAND DOMINANCE: Right  ADLs: Overall ADLs: Independent  FUNCTIONAL OUTCOME MEASURES: Upper Extremity Functional Scale (UEFS): 65  Difficulty noted with following activities: carrying objects including briefcase/computer bag Participating in usual hobbies, recreational or  sporting activities (ie bowling). Lifting a bag of groceries to waist level/ above head.  UPPER EXTREMITY ROM:     Active ROM - WNL throughout Right eval Left eval  Shoulder flexion    Shoulder abduction    Shoulder adduction    Shoulder extension    Shoulder internal rotation     Shoulder external rotation    Elbow flexion    Elbow extension    Wrist flexion Discomfort on back of hand   Wrist extension    Wrist ulnar deviation    Wrist radial deviation    Wrist pronation    Wrist supination    (Blank rows = not tested)   UPPER EXTREMITY MMT:     MMT Right eval Left eval  Shoulder flexion 5/5 5/5  Shoulder abduction 5/5 5/5  Shoulder adduction    Shoulder extension    Shoulder internal rotation    Shoulder external rotation    Middle trapezius    Lower trapezius    Elbow flexion 4-/5 - stops quickly  5/5  Elbow extension 4+/5 5/5  Wrist flexion    Wrist extension    Wrist ulnar deviation    Wrist radial deviation    Wrist pronation    Wrist supination    (Blank rows = not tested)  HAND FUNCTION: Grip strength: Right: 47.6, 54.0, 65.0  lbs; Left: 102.7, 109.7, 106.9  lbs, Lateral pinch: Right: 32 lbs, Left: 32 lbs, 3 point pinch: Right: 20 lbs, Left: 20,  lbs, and Tip pinch: Right 17 lbs, Left: 18 lbs  Grip Strength Average: Right: 55.5 lbs Left: 106.4 lbs  Pinching motions do not cause pain but gripping does.  COORDINATION: NT  SENSATION: WFL  EDEMA: NA  COGNITION: Overall cognitive status: Within functional limits for tasks assessed  OBSERVATIONS at Eval: Pt is a fit gentleman with no balance deficits or obvious coordination impairments who presents with palpable tenderness at the lateral epicondyle and obvious UE strength deficits of his dominant R UE compared to LUE.  TODAY'S TREATMENT:                                                                                                                               - Therapeutic exercises completed or strength and endurance of RUE extremity as noted below including: Wrist flexion via rolling tan flex bar forward x 10-15 reps Wrist extension via roling tan flex bar backwards x 10-15 reps  Supination with tan flex bar x 10 reps at 10-20 second holds x2 different motions - first using 2  hands to bend the bar down and then using one hand and bar on table top to bend the bar downward (out to the side) Pronation with tan flex bar x 10 reps at 10-20 second holds x2 different motions - first using 2 hands to bend the bar up and then using one hand and bar on table top to bend the bar downward (in  to the middle) Wrist ulnar deviation x 10 reps at 10-20 second holds by pushing the tan flex bar forward on the table   Wrist radial deviation x 10 reps at 10-20 second holds by pulling the tan flex bar towards himself  on the table  Images are printed from [employer] and he is encouraged to add the radial/ulnar deviation motions to his home routine. Patient still has difficulty with weightbearing for pushups and hand shaking.  He is encouraged to try push up holding a hand weight and does not have as much difficulty.  - Ultrasound completed for duration as noted below including:  Ultrasound applied to dorsal right hand/thumb using small sound head for 10 minutes, frequency of 3 MHz, 20% duty cycle, and 1.1 W/cm with pt's arm placed on soft towel for promotion of ROM, edema reduction, and pain reduction in affected extremity.  PATIENT EDUCATION: Education details: Sales executive Person educated: Patient Education method: Explanation, Demonstration, Verbal cues, and Handouts Education comprehension: verbalized understanding, returned demonstration, and needs further education  HOME EXERCISE PROGRAM: 02/16/23 - Epicondylitis Conservative Treatment handout 03/01/23 - Lateral Epicondylitis Exercise Program - wrist flex/ext, Forearm pro/sup, grip and finger ext 03/09/23 - Ice massage & Medial Epicondylitis Exercises (active strengthening) 03/15/23 - Sleep positions, Joint protection Principles 03/17/23 - Eccentric Forearm Strengthening 03/30/23 - Access Code: [email or web address] - resistance bar   GOALS: Goals reviewed with patient? Yes  SHORT TERM GOALS: Target date: 03/18/23  Patient will  demonstrate ROM HEP with 25% verbal cues or less for proper execution. Baseline: New injury Goal status: MET  2.  Patient will demonstrate at least 75 lbs RUE grip strength as needed to open jars and other containers. Baseline: Right: 47.6, 54.0, 65.0 Average 55.5 lbs 03/15/2023: 96.7 lbs Goal status: MET  3.  Patient will report pain in the RUE <5/10 with use. Baseline: 10/10 with use 03/15/2023 - up to 5/10 pain with use Goal status: IN Progress  4.  Patient will be able to carry 10 lbs with elbow flexed without difficulty.  Baseline: Using L hand to carry briefcase. Goal status: MET   LONG TERM GOALS: Target date: 04/15/23  Patient will demonstrate HEP including use of home based modalities with Mod I proper execution. Baseline: New Injury Goal status: IN Progress  2.  Patient will demonstrate at least 90+ lbs RUE grip strength. Baseline: Right: 47.6, 54.0, 65.0 Average 55.5 lbs 03/15/2023: 96.7 lbs Goal status: MET  3.  Patient will be able to lift 14 lb bowling ball to resume leisure activity and will report no more than mild difficulty. Baseline: unable to bowl per UEFI, 10/10 pain Goal status: IN Progress  4.  Patient will be aware of 3 joint protection, ergonomics, and body mechanic principles as needed to improve UE pain.  Baseline: 10/10 pain and unable to bowl. Goal status: MET  ASSESSMENT:  CLINICAL IMPRESSION: Patient demonstrates good tolerance to ongoing use of modalities and strengthening exercises this visit. Pt continues to work on strengthening activities at home and ongoing activity modification recommendations are provided ie) using wrist brace to keep writ neutral with hand shake etc. Skilled OT services to continue to maximize functional use of R UE with decreased pain for resuming leisure activities. Will likely be appropriate for d/c in the near future.   PERFORMANCE DEFICITS: in functional skills including IADLs, strength, pain, Gross motor control,  and UE functional use.  IMPAIRMENTS: are limiting patient from IADLs and leisure.   COMORBIDITIES: may have co-morbidities  that affects occupational performance. Patient will benefit from skilled OT to address above impairments and improve overall function.  REHAB POTENTIAL: Excellent  PLAN:  OT FREQUENCY: 1-2x/week  OT DURATION: 6 weeks  PLANNED INTERVENTIONS: therapeutic exercise, therapeutic activity, manual therapy, electrical stimulation, ultrasound, iontophoresis, coping strategies training, and DME and/or AE instructions  RECOMMENDED OTHER SERVICES: NA  CONSULTED AND AGREED WITH PLAN OF CARE: Patient  PLANs FOR NEXT SESSION:  review IASTM (he did get his tools) Check on tennis elbow strap & what about a bowling brace Home Modality options vs therapy - ultrasound/estim; heat/ice Review stretching/HEP progression, isometrics,  Will need to explore modifications for bowling to prevent further exacerbations Explore taping options.   Delana Meyer, OT 04/05/2023, 1:52 PM

## 2023-04-06 ENCOUNTER — Ambulatory Visit: Payer: 59 | Admitting: Occupational Therapy

## 2023-04-06 DIAGNOSIS — M25521 Pain in right elbow: Secondary | ICD-10-CM | POA: Diagnosis not present

## 2023-04-06 DIAGNOSIS — M79644 Pain in right finger(s): Secondary | ICD-10-CM

## 2023-04-06 DIAGNOSIS — M7701 Medial epicondylitis, right elbow: Secondary | ICD-10-CM

## 2023-04-06 DIAGNOSIS — M6281 Muscle weakness (generalized): Secondary | ICD-10-CM

## 2023-04-06 DIAGNOSIS — M7711 Lateral epicondylitis, right elbow: Secondary | ICD-10-CM

## 2023-04-06 NOTE — Therapy (Unsigned)
OUTPATIENT OCCUPATIONAL THERAPY ORTHO TREATMENT & PROGRESS NOTE  Patient Name: Wayne Morales MRN: [id number] DOB:1970-09-10, 52 y.o., male Today's Date: 04/06/2023  PCP: Etta Grandchild, MD REFERRING PROVIDER: Etta Grandchild, MD  END OF SESSION:  OT End of Session - 04/06/23 1525     Visit Number 10    Number of Visits 16   + evaluation   Date for OT Re-Evaluation 05/20/23    Authorization Type [employer] 2024 VL: 60 combined w/ PT&ST Auth Not Reqd    OT Start Time 1530    OT Stop Time 1615    OT Time Calculation (min) 45 min    Equipment Utilized During Treatment Elbow strap in place, Ultrasound    Activity Tolerance Patient tolerated treatment well    Behavior During Therapy WFL for tasks assessed/performed             Past Medical History:  Diagnosis Date   Atrial fibrillation ([facility]CC)    pt states they "figured it was not a-fib" but does have trouble with heart valve   Chronic back pain    Hyperlipidemia    diet controlled   Hypertension    on meds   Scalp psoriasis 12/15/2012   Past Surgical History:  Procedure Laterality Date   INGUINAL HERNIA REPAIR Left    as a child   Patient Active Problem List   Diagnosis Date Noted   Hyperglycemia 01/24/2023   Narcolepsy due to underlying 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 09/08/2022   Panic anxiety syndrome 05/19/2022   GERD (gastroesophageal reflux disease) 05/19/2022   GAD (generalized anxiety disorder) 05/17/2022   Drug-induced erectile dysfunction 06/02/2021   Encounter for general adult medical examination with abnormal findings 04/01/2021   Vitamin D deficiency disease 04/01/2021   Colon cancer screening 04/01/2021   Pure hyperglyceridemia 09/09/2017   Routine general medical examination at a health care facility 09/07/2017    Onychomycosis of great toe 09/07/2017   Protrusion of lumbar intervertebral disc    Essential hypertension 07/08/2014   Scalp psoriasis 12/15/2012   Hyperlipidemia with target LDL less than 160 07/08/2008    ONSET DATE: 01/24/2023  REFERRING DIAG: M77.11 (ICD-10-CM) - Right tennis elbow  THERAPY DIAG:  Pain in right elbow  Lateral epicondylitis of right elbow  Medial epicondylitis of elbow, right  Pain of right thumb  Muscle weakness (generalized)  Rationale for Evaluation and Treatment: Rehabilitation  SUBJECTIVE:   SUBJECTIVE STATEMENT:  Pt reports his arm is worse today/this week s/p trying the arm braces he got earlier this week.  He tried to wear it overnight but it made it worse and his elbow is more sore today.  Pt accompanied by: self  PERTINENT HISTORY:  Medical Record Review: Recently, he has developed pain in his right forearm, described as shooting pain extending from the elbow, particularly when shaking hands or lifting objects weighing more than five pounds. The pain is located on the outside of the forearm and has been present for about a week and a half. He denies any known injury. He does repetitive activity (bowling).  PRECAUTIONS: None  WEIGHT BEARING RESTRICTIONS: No  PAIN:  Are you having pain? Not upon arrival to therapy, with use pain is around 3/10 - worst pain over the weekend 5/10  FALLS: Has patient fallen in last 6 months? Yes. 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  PATIENT GOALS: Get rid of the pain and increase strength.  NEXT MD VISIT: Not Scheduled  OBJECTIVE:   HAND  DOMINANCE: Right  ADLs: Overall ADLs: Independent  FUNCTIONAL OUTCOME MEASURES: Upper Extremity Functional Scale (UEFS): 65  Difficulty noted with following activities: carrying objects including briefcase/computer bag Participating in usual hobbies, recreational or sporting activities (ie bowling). Lifting a bag of groceries to waist level/ above head.  UPPER EXTREMITY ROM:     Active ROM - WNL throughout Right eval Left eval  Shoulder flexion    Shoulder abduction    Shoulder adduction    Shoulder extension    Shoulder internal rotation    Shoulder external rotation    Elbow flexion    Elbow extension    Wrist flexion Discomfort on back of hand   Wrist extension    Wrist ulnar deviation    Wrist radial deviation    Wrist pronation    Wrist supination    (Blank rows = not tested)   UPPER EXTREMITY MMT:     MMT Right eval Left eval  Shoulder flexion 5/5 5/5  Shoulder abduction 5/5 5/5  Shoulder adduction    Shoulder extension    Shoulder internal rotation    Shoulder external rotation    Middle trapezius    Lower trapezius    Elbow flexion 4-/5 - stops quickly  5/5  Elbow extension 4+/5 5/5  Wrist flexion    Wrist extension    Wrist ulnar deviation    Wrist radial deviation    Wrist pronation    Wrist supination    (Blank rows = not tested)  HAND FUNCTION: Grip strength: Right: 47.6, 54.0, 65.0  lbs; Left: 102.7, 109.7, 106.9  lbs, Lateral pinch: Right: 32 lbs, Left: 32 lbs, 3 point pinch: Right: 20 lbs, Left: 20,  lbs, and Tip pinch: Right 17 lbs, Left: 18 lbs  Grip Strength Average: Right: 55.5 lbs Left: 106.4 lbs  Pinching motions do not cause pain but gripping does.  COORDINATION: NT  SENSATION: WFL  EDEMA: NA  COGNITION: Overall cognitive status: Within functional limits for tasks assessed  OBSERVATIONS at Eval: Pt is a fit gentleman with no balance deficits or obvious coordination impairments who presents with palpable tenderness at the  lateral epicondyle and obvious UE strength deficits of his dominant R UE compared to LUE.  TODAY'S TREATMENT:                                                                                                                               Orthotic management: Reviewed use  of "tennis elbow" elbow band/compression wrap ie) positioning for comfort and support, removal with discomfort, limited use overnight and avoiding increased discomfort with use.   - Therapeutic exercises completed or strength and endurance of RUE extremity as noted below including: Introduced, demonstrated and sought return demonstration of exercises for epicondylitis as provided in patient instructions today including specific exercises (without weights if needed for comfort at this time due to increased discomfort) - Wrist extension and flexion motion - Elbow flexion and extension - Forearm pronation/supination  - Shoulder abduction, internal/external rotation  - Ultrasound completed for duration as noted below including:  Ultrasound applied to dorsal right hand/thumb using small sound head for 10 minutes, frequency of 3 MHz, 20% duty cycle, and 1.1 W/cm with pt's arm placed on soft towel for promotion of ROM, edema reduction, and pain reduction in affected extremity.  PATIENT EDUCATION: Education details: Lateral Epicondylitis Exercises Person educated: Patient Education method: Explanation, Demonstration, Verbal cues, and Handouts Education comprehension: verbalized understanding, returned demonstration, and needs further education  HOME EXERCISE PROGRAM: 02/16/23 - Epicondylitis Conservative Treatment handout 03/01/23 - Lateral Epicondylitis Exercise Program - wrist flex/ext, Forearm pro/sup, grip and finger ext 03/09/23 - Ice massage & Medial Epicondylitis Exercises (active strengthening) 03/15/23 - Sleep positions, Joint protection Principles 03/17/23 - Eccentric Forearm Strengthening 03/30/23 - Access Code: [email or web address] -  resistance bar 04/07/23 - Lateral Epicondylitis strengthening   GOALS: Goals reviewed with patient? Yes  SHORT TERM GOALS: Target date: 03/18/23  Patient will demonstrate ROM HEP with 25% verbal cues or less for proper execution. Baseline: New injury Goal status: MET  2.  Patient will demonstrate at least 75 lbs RUE grip strength as needed to open jars and other containers. Baseline: Right: 47.6, 54.0, 65.0 Average 55.5 lbs 03/15/2023: 96.7 lbs Goal status: MET  3.  Patient will report pain in the RUE <5/10 with use. Baseline: 10/10 with use 03/15/2023 - up to 5/10 pain with use Goal status: IN Progress 04/06/23 - Aching at 4/10 (at rest)  4.  Patient will be able to carry 10 lbs with elbow flexed without difficulty.  Baseline: Using L hand to carry briefcase. Goal status: MET   LONG TERM GOALS: Target date: 04/15/23  Patient will demonstrate HEP including use of home based modalities with Mod I proper execution. Baseline: New Injury Goal status: IN Progress  2.  Patient will demonstrate at least 90+ lbs RUE grip strength. Baseline: Right: 47.6, 54.0, 65.0 Average 55.5 lbs 03/15/2023: 96.7 lbs Goal status: MET 04/06/23 - 85-88 lbs s/p increased discomfort in arm this week  3.  Patient will be able to lift 14 lb bowling ball to resume leisure activity and will report no more than mild difficulty. Baseline: unable to bowl per UEFI, 10/10 pain Goal status: IN Progress  4.  Patient will be aware of 3 joint protection, ergonomics, and body mechanic principles as needed to improve UE pain.  Baseline: 10/10 pain and unable to bowl. Goal status: MET  ASSESSMENT:  CLINICAL IMPRESSION: Patient demonstrates good tolerance to ongoing use of modalities and strengthening exercises this visit but aggravated his elbow pain/discomfort with excessive use of new splints this week.  Pt encouraged to modify use of splints, exercises with less resistance and continue to work on joint  protection. Skilled OT services to continue to maximize functional use of R UE with decreased pain for resuming leisure activities. Was expecting him to be appropriate for d/c but will recert him due to increased pain this day.  This  10th progress note is for dates: 02/16/23 to 04/07/2023. Pt has met 3/4 STGs and 2/4 LTGs. Pt making progress towards goals as expected and continues to benefit from skilled OT services in the outpatient setting to work towards remaining goals or until max rehab potential is met.    PERFORMANCE DEFICITS: in functional skills including IADLs, strength, pain, Gross motor control, and UE functional use.  IMPAIRMENTS: are limiting patient from IADLs and leisure.   COMORBIDITIES: may have co-morbidities  that affects occupational performance. Patient will benefit from skilled OT to address above impairments and improve overall function.  REHAB POTENTIAL: Excellent  PLAN:  OT FREQUENCY: 1x/week  OT DURATION: additional 4-6 weeks  PLANNED INTERVENTIONS: therapeutic exercise, therapeutic activity, manual therapy, electrical stimulation, ultrasound, iontophoresis, coping strategies training, and DME and/or AE instructions  RECOMMENDED OTHER SERVICES: NA  CONSULTED AND AGREED WITH PLAN OF CARE: Patient  PLANs FOR NEXT SESSION:  review IASTM (he did get his tools) Check on tennis elbow strap & what about a bowling brace Home Modality options vs therapy - ultrasound/estim; heat/ice Review stretching/HEP progression, isometrics,  Will need to explore modifications for bowling to prevent further exacerbations Explore taping options.   Victorino Sparrow, OT 04/06/2023, 5:42 PM

## 2023-04-06 NOTE — Progress Notes (Signed)
See procedure note.

## 2023-04-07 ENCOUNTER — Other Ambulatory Visit: Payer: Self-pay | Admitting: Internal Medicine

## 2023-04-07 DIAGNOSIS — E785 Hyperlipidemia, unspecified: Secondary | ICD-10-CM

## 2023-04-12 NOTE — Procedures (Signed)
[facility]  HOME SLEEP TEST (Watch PAT) REPORT  STUDY DATE: 04/05/23  DOB: [birth year]  MRN: [id number]  ORDERING CLINICIAN: [name], MD, PhD   REFERRING CLINICIAN: Etta Grandchild, MD   CLINICAL INFORMATION/HISTORY: 52 year old male with an underlying medical history of PVCs, reflux disease, hyperlipidemia, hypertension, tennis elbow, chronic low back pain, psoriasis, anxiety and mild obesity, who reports a longstanding history of daytime somnolence.   Epworth sleepiness score: 17/24.  BMI: 31.8 kg/m  FINDINGS:   Sleep Summary:   Total Recording Time (hours, min): 7 hours, 24 min  Total Sleep Time (hours, min):  6 hours, 23 min  Percent REM (%):    30.4%   Respiratory Indices:   Calculated pAHI (per hour):  13.4/hour         REM pAHI:    20.8/hour       NREM pAHI: 10.4/hour  Central pAHI: 0/hour  Oxygen Saturation Statistics:    Oxygen Saturation (%) Mean: 93%   Minimum oxygen saturation (%):                 81%   O2 Saturation Range (%): 81 - 98%    O2 Saturation (minutes) <=88%: 0.6 min  Pulse Rate Statistics:   Pulse Mean (bpm):    66/min    Pulse Range (48 - 122/min)   IMPRESSION: OSA (obstructive sleep apnea), mild  RECOMMENDATION:  This home sleep test demonstrates overall mild obstructive sleep apnea with a total AHI of 13.4/hour and O2 nadir of 81%. The snore and body position sensor were not available during this test.  Given the patient's medical history and sleep related complaints, therapy with a  positive airway pressure device is a reasonable first-line choice and clinically recommended. Treatment can be achieved in the form of autoPAP trial/titration at home for now. A full night, in-lab PAP titration study may aid in improving proper treatment settings and with mask fit, if needed, down the road. Alternative treatments may include weight loss (where appropriate) along with avoidance of the supine sleep position (if  possible), or an oral appliance in appropriate candidates.   Please note that untreated obstructive sleep apnea may carry additional perioperative morbidity. Patients with significant obstructive sleep apnea should receive perioperative PAP therapy and the surgeons and particularly the anesthesiologist should be informed of the diagnosis and the severity of the sleep disordered breathing. The patient should be cautioned not to drive, work at heights, or operate dangerous or heavy equipment when tired or sleepy. Review and reiteration of good sleep hygiene measures should be pursued with any patient. Other causes of the patient's symptoms, including circadian rhythm disturbances, an underlying mood disorder, medication effect and/or an underlying medical problem cannot be ruled out based on this test. Clinical correlation is recommended.  The patient and his referring provider will be notified of the test results. The patient will be seen in follow up in sleep clinic at [facility], as necessary.  I certify that I have reviewed the raw data recording prior to the issuance of this report in accordance with the standards of the American Academy of Sleep Medicine (AASM).  INTERPRETING PHYSICIAN:   [name], MD, PhD Medical Director, [name]) Diplomat, ABPN (Neurology and Sleep)   [facility] [address] [place], [place] [address] [phone]

## 2023-04-12 NOTE — Addendum Note (Signed)
Addended by: [name] on: 04/12/2023 06:05 PM   Modules accepted: Orders

## 2023-04-13 ENCOUNTER — Telehealth: Payer: Self-pay | Admitting: *Deleted

## 2023-04-13 NOTE — Telephone Encounter (Signed)
Called pt & LVM with office number and hours asking for call back. No DPR on file.

## 2023-04-13 NOTE — Telephone Encounter (Signed)
-----   Message from [name] sent at 04/12/2023  6:05 PM EDT ----- Patient referred by PCP, seen by me on 03/14/2023, patient had HST on 04/05/2023.    Please call and notify the patient that the recent home sleep test showed obstructive sleep apnea. OSA is overall mild, but worth treating to see if he feels better after treatment. To that end I recommend treatment for this in the form of autoPAP, which means, that we don't have to bring him in for a sleep study with CPAP, but will let him try an autoPAP machine at home, through a DME company (of his choice, or as per insurance requirement). The DME representative will educate him on how to use the machine, how to put the mask on, etc. I have placed an order in the chart. Please send referral, talk to patient, send report to referring MD. We will need a FU in sleep clinic in about 2.-3 months post-PAP set up (which is usually an insurance-mandated appointment to monitor compliance), please arrange that with me or one of our NPs. Please also go over the need for compliance with treatment (including the insurance-imposed minimum compliance percentage). Thanks,   [name], MD, PhD [facility])

## 2023-04-14 NOTE — Telephone Encounter (Signed)
[facility] confirmed receipt of order.

## 2023-04-14 NOTE — Telephone Encounter (Signed)
The patient returned my call and we discussed his sleep study results.  The patient is amenable to proceeding with an AutoPap trial.  We discussed insurance compliance requirements which includes using the machine at least 4 hours at night and also being seen in our office for follow-up between 30 and 90 days after set up.  I scheduled him for a follow-up with Megan NP on 06/20/2023 at 1:15 PM arrival 1:00.  The patient is amenable to a referral to [facility]. His questions were answered during the call.  He did have 1 additional question about his Celexa.  He is asking if it is okay for him to resume it now since he titrated off for the sleep study.  I let him know I would call him back after consulting with Dr. Frances Furbish.    Referral sent to [facility]. Sleep study report sent to referring provider.

## 2023-04-18 ENCOUNTER — Ambulatory Visit: Payer: 59 | Admitting: Occupational Therapy

## 2023-04-18 DIAGNOSIS — M25521 Pain in right elbow: Secondary | ICD-10-CM | POA: Diagnosis not present

## 2023-04-18 DIAGNOSIS — M7711 Lateral epicondylitis, right elbow: Secondary | ICD-10-CM

## 2023-04-18 DIAGNOSIS — M6281 Muscle weakness (generalized): Secondary | ICD-10-CM

## 2023-04-18 NOTE — Patient Instructions (Addendum)
Access Code: [email or web address] URL: [email or web address] Date: 04/18/2023 Prepared by: Amada Kingfisher  Exercises - Shoulder extension with resistance - Neutral  - 1 x daily - 10 reps - Standing Single Arm Shoulder PNF D1 Extension with Anchored Resistance  - 1 x daily - 10 reps - Standing Single Arm Shoulder PNF D1 Flexion with Anchored Resistance  - 1 x daily - 10 reps - Standing Single Arm Shoulder Flexion with Posterior Anchored Resistance  - 1 x daily - 10 reps

## 2023-04-18 NOTE — Telephone Encounter (Signed)
Pt returned my call. We discussed the message from Dr Frances Furbish. He is aware he is free to restart Celexa, may need to re-titrate however so he should contact primary care to discuss. Pt's questions were answered. He also believes he has received a call from [facility] already. He verbalized appreciation for the call from our office.

## 2023-04-18 NOTE — Telephone Encounter (Signed)
Called pt & LVM with office number and hours asking for call back.  ?

## 2023-04-18 NOTE — Therapy (Addendum)
OUTPATIENT OCCUPATIONAL THERAPY ORTHO TREATMENT & PROGRESS NOTE  Patient Name: Wayne Morales MRN: [id number] DOB:08-01-1970, 52 y.o., male Today's Date: 04/18/2023  PCP: Etta Grandchild, MD REFERRING PROVIDER: Etta Grandchild, MD  END OF SESSION:  OT End of Session - 04/18/23 0816     Visit Number 11    Number of Visits 16   + evaluation   Date for OT Re-Evaluation 05/20/23    Authorization Type [employer]HC 2024 VL: 60 combined w/ PT&ST Auth Not Reqd    OT Start Time 0816    OT Stop Time 0846    OT Time Calculation (min) 30 min    Equipment Utilized During Treatment rebounder, resitance bar, blue theraband    Activity Tolerance Patient tolerated treatment well    Behavior During Therapy WFL for tasks assessed/performed             Past Medical History:  Diagnosis Date   Atrial fibrillation (HCC)    pt states they "figured it was not a-fib" but does have trouble with heart valve   Chronic back pain    Hyperlipidemia    diet controlled   Hypertension    on meds   Scalp psoriasis 12/15/2012   Past Surgical History:  Procedure Laterality Date   INGUINAL HERNIA REPAIR Left    as a child   Patient Active Problem List   Diagnosis Date Noted   Hyperglycemia 01/24/2023   Narcolepsy due to underlying 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 09/08/2022   Panic anxiety syndrome 05/19/2022   GERD (gastroesophageal reflux disease) 05/19/2022   GAD (generalized anxiety disorder) 05/17/2022   Drug-induced erectile dysfunction 06/02/2021   Encounter for general adult medical examination with abnormal findings 04/01/2021   Vitamin D deficiency disease 04/01/2021   Colon cancer screening 04/01/2021   Pure hyperglyceridemia 09/09/2017   Routine general medical examination at a health care facility 09/07/2017    Onychomycosis of great toe 09/07/2017   Protrusion of lumbar intervertebral disc    Essential hypertension 07/08/2014   Scalp psoriasis 12/15/2012   Hyperlipidemia with target LDL less than 160 07/08/2008    ONSET DATE: 01/24/2023  REFERRING DIAG: M77.11 (ICD-10-CM) - Right tennis elbow  THERAPY DIAG:  Lateral epicondylitis of right elbow  Pain in right elbow  Muscle weakness (generalized)  Rationale for Evaluation and Treatment: Rehabilitation  SUBJECTIVE:   SUBJECTIVE STATEMENT:  Pt reports his arm is doing well and is currently avoiding "over-the-top" activities. Pt reports wearing the smaller elbow brace periodically. Pt reports improved ability to pick up objects, such as personal laptop. Pt continues to report significant pain when shaking hands with customers and at a party with friends over the weekend.   Pt accompanied by: self  PERTINENT HISTORY:  Medical Record Review: Recently, he has developed pain in his right forearm, described as shooting pain extending from the elbow, particularly when shaking hands or lifting objects weighing more than five pounds. The pain is located on the outside of the forearm and has been present for about a week and a half. He denies any known injury. He does repetitive activity (bowling).  PRECAUTIONS: None  WEIGHT BEARING RESTRICTIONS: No  PAIN:  Are you having pain? Not upon arrival to therapy, with use pain is around 2/10 - worst pain over the weekend 3/10  FALLS: Has patient fallen in  last 6 months? Yes. 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  PATIENT GOALS: Get rid of the  pain and increase strength.  NEXT MD VISIT: Not Scheduled  OBJECTIVE:   HAND DOMINANCE: Right  ADLs: Overall ADLs: Independent  FUNCTIONAL OUTCOME MEASURES: Upper Extremity Functional Scale (UEFS): 65  Difficulty noted with following activities: carrying objects including briefcase/computer bag Participating in usual hobbies, recreational or sporting activities (ie bowling). Lifting a bag of groceries to waist level/ above head.  UPPER EXTREMITY ROM:     Active ROM - WNL throughout Right eval Left eval  Shoulder flexion    Shoulder abduction    Shoulder adduction    Shoulder extension    Shoulder internal rotation    Shoulder external rotation    Elbow flexion    Elbow extension    Wrist flexion Discomfort on back of hand   Wrist extension    Wrist ulnar deviation    Wrist radial deviation    Wrist pronation    Wrist supination    (Blank rows = not tested)   UPPER EXTREMITY MMT:     MMT Right eval Left eval  Shoulder flexion 5/5 5/5  Shoulder abduction 5/5 5/5  Shoulder adduction    Shoulder extension    Shoulder internal rotation    Shoulder external rotation    Middle trapezius    Lower trapezius    Elbow flexion 4-/5 - stops quickly  5/5  Elbow extension 4+/5 5/5  Wrist flexion    Wrist extension    Wrist ulnar deviation    Wrist radial deviation    Wrist pronation    Wrist supination    (Blank rows = not tested)  HAND FUNCTION: Grip strength: Right: 47.6, 54.0, 65.0  lbs; Left: 102.7, 109.7, 106.9  lbs, Lateral pinch: Right: 32 lbs, Left: 32 lbs, 3 point pinch: Right: 20 lbs, Left: 20,  lbs, and Tip pinch: Right 17 lbs, Left: 18 lbs  Grip Strength Average: Right: 55.5 lbs Left: 106.4 lbs  Pinching motions do not cause pain but gripping does.  COORDINATION: NT  SENSATION: WFL  EDEMA: NA  COGNITION: Overall cognitive status: Within functional limits for tasks assessed  OBSERVATIONS at Eval: Pt is a fit gentleman with no balance  deficits or obvious coordination impairments who presents with palpable tenderness at the lateral epicondyle and obvious UE strength deficits of his dominant R UE compared to LUE.  TODAY'S TREATMENT:                                                                                                                               -  Therapeutic exercises completed with RUE extremity as noted below including: To increase RUE strengthening and stability, pt participated in overhand toss-and-catch of 5 lb. ball to trampoline progressing from BUE to RUE only x10 reps. Pt reported no pain and therefore task was progressed to 7 lb. ball.  To increase RUE strengthening and stability, pt then participated in under-hand toss-and-catch of 5 lb. ball to trampoline using RUE only x10 reps to simulate bowling motion. Pt reported mild discomfort at elbow during task. To increase RUE strengthening and stability, pt participated in Theraband exercises: standing diagonal reach, standing shoulder flex, standing shoulder ext, rows, seated shoulder abduction (5 reps each). Pt intermittently reported mild pain at elbow during some exercises though ultimately completed tasks without difficulty. Handouts provided for the following with return demonstration sought and examples provided for variations ie) form overhead, from waist height and from floor for each motion. - Shoulder extension with resistance - Neutral   - Standing Single Arm Shoulder PNF D1 Extension with Anchored Resistance   - Standing Single Arm Shoulder PNF D1 Flexion with Anchored Resistance  - Standing Single Arm Shoulder Flexion with Posterior Anchored Resistance    To increase RUE strengthening and stability, pt participated in resistance therapy bar tasks (2 min. per exercise), including wrist flex/ext to twist therapy bar, wrist radial/ulnar deviation to tap bar on tabletop, vertical oscillations. OT provided education regarding joint protection strategies  and energy conservation strategies to prevent fatigue d/t repetitive movements, wearing splint as tolerated.  PATIENT EDUCATION: Education details: Theraband exercises Person educated: Patient Education method: Explanation, Demonstration, Verbal cues, and Handouts Education comprehension: verbalized understanding, returned demonstration, and needs further education  HOME EXERCISE PROGRAM: 02/16/23 - Epicondylitis Conservative Treatment handout 03/01/23 - Lateral Epicondylitis Exercise Program - wrist flex/ext, Forearm pro/sup, grip and finger ext 03/09/23 - Ice massage & Medial Epicondylitis Exercises (active strengthening) 03/15/23 - Sleep positions, Joint protection Principles 03/17/23 - Eccentric Forearm Strengthening 03/30/23 - Access Code: [email or web address] - resistance bar 04/07/23 - Lateral Epicondylitis strengthening 04/18/23 - Theraband Exercises: Access Code: [email or web address]  GOALS: Goals reviewed with patient? Yes  SHORT TERM GOALS: Target date: 03/18/23  Patient will demonstrate ROM HEP with 25% verbal cues or less for proper execution. Baseline: New injury Goal status: MET  2.  Patient will demonstrate at least 75 lbs RUE grip strength as needed to open jars and other containers. Baseline: Right: 47.6, 54.0, 65.0 Average 55.5 lbs 03/15/2023: 96.7 lbs Goal status: MET  3.  Patient will report pain in the RUE <5/10 with use. Baseline: 10/10 with use 03/15/2023 - up to 5/10 pain with use Goal status: IN Progress 04/06/23 - Aching at 4/10 (at rest)  4.  Patient will be able to carry 10 lbs with elbow flexed without difficulty.  Baseline: Using L hand to carry briefcase. Goal status: MET   LONG TERM GOALS: Target date: 04/15/23  Patient will demonstrate HEP including use of home based modalities with Mod I proper execution. Baseline: New Injury Goal status: IN Progress  2.  Patient will demonstrate at least 90+ lbs RUE grip strength. Baseline: Right: 47.6, 54.0, 65.0 Average 55.5  lbs 03/15/2023: 96.7 lbs Goal status: MET 04/06/23 - 85-88 lbs s/p increased discomfort in arm this week  3.  Patient will be able to lift 14 lb bowling ball to resume leisure activity and will report no more than mild difficulty. Baseline: unable to bowl per UEFI, 10/10 pain Goal status: IN Progress  4.  Patient will be aware of 3  joint protection, ergonomics, and Optometrist principles as needed to improve UE pain.  Baseline: 10/10 pain and unable to bowl. Goal status: MET  ASSESSMENT:  CLINICAL IMPRESSION: Patient demonstrates good rest and recovery for R UE over the past week and has decreased pain and ease of picking up objects.  Continued progression of strengthening exercises this visit with good success.  Skilled OT services to continue to maximize functional use of R UE with decreased pain for resuming leisure activities and increased ease with shaking hands.  PERFORMANCE DEFICITS: in functional skills including IADLs, strength, pain, Gross motor control, and UE functional use.  IMPAIRMENTS: are limiting patient from IADLs and leisure.   COMORBIDITIES: may have co-morbidities  that affects occupational performance. Patient will benefit from skilled OT to address above impairments and improve overall function.  REHAB POTENTIAL: Excellent  PLAN:  OT FREQUENCY: 1x/week  OT DURATION: additional 4-6 weeks  PLANNED INTERVENTIONS: therapeutic exercise, therapeutic activity, manual therapy, electrical stimulation, ultrasound, iontophoresis, coping strategies training, and DME and/or AE instructions  RECOMMENDED OTHER SERVICES: NA  CONSULTED AND AGREED WITH PLAN OF CARE: Patient  PLANs FOR NEXT SESSION:  review IASTM (he did get his tools) Check on tennis elbow strap/bowling brace Home Modality options vs therapy - ultrasound/estim; heat/ice Review stretching/HEP progression, isometrics,  Will need to explore modifications for bowling to prevent further  exacerbations Explore taping options.   Victorino Sparrow, OT 04/18/2023, 8:48 AM

## 2023-04-18 NOTE — Telephone Encounter (Signed)
The medication taper was intended to be done only for the nighttime laboratory attended sleep study and next day MSLT, which were not able to do due to insurance denial.  The medication taper did not apply to the home sleep test, we had spoken about the laboratory testing and requirements during the visit.  He was not required to taper off anything for the home sleep test since we did not end up doing the laboratory testing.  He can restart the Celexa but may have to titrate up, he can ask PCP for guidance as to how quickly to increase it.

## 2023-04-27 ENCOUNTER — Other Ambulatory Visit: Payer: Self-pay | Admitting: Cardiology

## 2023-04-27 ENCOUNTER — Ambulatory Visit: Payer: 59 | Attending: Internal Medicine | Admitting: Occupational Therapy

## 2023-04-27 DIAGNOSIS — M7711 Lateral epicondylitis, right elbow: Secondary | ICD-10-CM | POA: Insufficient documentation

## 2023-04-27 DIAGNOSIS — M25541 Pain in joints of right hand: Secondary | ICD-10-CM | POA: Diagnosis present

## 2023-04-27 DIAGNOSIS — I10 Essential (primary) hypertension: Secondary | ICD-10-CM

## 2023-04-27 DIAGNOSIS — M25521 Pain in right elbow: Secondary | ICD-10-CM | POA: Insufficient documentation

## 2023-04-27 NOTE — Therapy (Unsigned)
OUTPATIENT OCCUPATIONAL THERAPY ORTHO TREATMENT & PROGRESS NOTE  Patient Name: Kyjuan Gause MRN: [id number] DOB:02/23/1971, 52 y.o., male Today's Date: 04/27/2023  PCP: Etta Grandchild, MD REFERRING PROVIDER: Etta Grandchild, MD  END OF SESSION:  OT End of Session - 04/27/23 1535     Visit Number 12    Number of Visits 16   + evaluation   Date for OT Re-Evaluation 05/20/23    Authorization Type [employer] 2024 VL: 60 combined w/ PT&ST Auth Not Reqd    OT Start Time 1334    OT Stop Time 1416    OT Time Calculation (min) 42 min    Equipment Utilized During Treatment rebounder, resitance bar, paraffin    Activity Tolerance Patient tolerated treatment well    Behavior During Therapy WFL for tasks assessed/performed             Past Medical History:  Diagnosis Date   Atrial fibrillation (HCC)    pt states they "figured it was not a-fib" but does have trouble with heart valve   Chronic back pain    Hyperlipidemia    diet controlled   Hypertension    on meds   Scalp psoriasis 12/15/2012   Past Surgical History:  Procedure Laterality Date   INGUINAL HERNIA REPAIR Left    as a child   Patient Active Problem List   Diagnosis Date Noted   Hyperglycemia 01/24/2023   Narcolepsy due to underlying 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 09/08/2022   Panic anxiety syndrome 05/19/2022   GERD (gastroesophageal reflux disease) 05/19/2022   GAD (generalized anxiety disorder) 05/17/2022   Drug-induced erectile dysfunction 06/02/2021   Encounter for general adult medical examination with abnormal findings 04/01/2021   Vitamin D deficiency disease 04/01/2021   Colon cancer screening 04/01/2021   Pure hyperglyceridemia 09/09/2017   Routine general medical examination at a health care facility 09/07/2017    Onychomycosis of great toe 09/07/2017   Protrusion of lumbar intervertebral disc    Essential hypertension 07/08/2014   Scalp psoriasis 12/15/2012   Hyperlipidemia with target LDL less than 160 07/08/2008    ONSET DATE: 01/24/2023  REFERRING DIAG: M77.11 (ICD-10-CM) - Right tennis elbow  THERAPY DIAG:  Lateral epicondylitis of right elbow  Pain in right elbow  Pain in joint of right hand  Rationale for Evaluation and Treatment: Rehabilitation  SUBJECTIVE:   SUBJECTIVE STATEMENT:  Pt reports his arm is doing well and is varying his grasp of item ie) sometimes picking up briefcase from pt seat form the driver's side and sometimes walking around the vehicle to get it out. Pt reports wearing the smaller elbow brace a little bit. Pt continues to report significant pain/discomfort when shaking hands with customers particularly at the base of the index finger.  Upon further discussion of leisure tasks, pt reported that he previously would go deep sea fishing a couple of times/year but he had difficulty holding a fishing rod while reeling in a small fish last week.  Pt accompanied by: self  PERTINENT HISTORY:  Medical Record Review: Recently, he has developed pain in his right forearm, described as shooting pain extending from the elbow, particularly when shaking hands or lifting objects weighing more than five pounds. The pain is located on the outside of the forearm and has been present for about a week and a  half. He denies any known injury. He does repetitive activity (bowling).  PRECAUTIONS: None  WEIGHT BEARING RESTRICTIONS: No  PAIN:  Are you having pain? Not upon arrival to therapy, with use pain is around 0/10 - worst pain over the weekend 3/10  FALLS: Has patient fallen in last 6 months? Yes. 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  PATIENT GOALS: Get rid of the pain and increase strength.  NEXT MD VISIT: Not Scheduled  OBJECTIVE:   HAND DOMINANCE: Right  ADLs: Overall ADLs: Independent  FUNCTIONAL OUTCOME MEASURES: Upper Extremity Functional Scale (UEFS): 65  Difficulty noted with following activities: carrying objects including briefcase/computer bag Participating in usual hobbies, recreational or sporting activities (ie bowling). Lifting a bag of groceries to waist level/ above head.  UPPER EXTREMITY ROM:     Active ROM - WNL throughout Right eval Left eval  Shoulder flexion    Shoulder abduction    Shoulder adduction    Shoulder extension    Shoulder internal rotation    Shoulder external rotation    Elbow flexion    Elbow extension    Wrist flexion Discomfort on back of hand   Wrist extension    Wrist ulnar deviation    Wrist radial deviation    Wrist pronation    Wrist supination    (Blank rows = not tested)   UPPER EXTREMITY MMT:     MMT Right eval Left eval  Shoulder flexion 5/5 5/5  Shoulder abduction 5/5 5/5  Shoulder adduction    Shoulder extension    Shoulder internal rotation    Shoulder external rotation    Middle trapezius    Lower trapezius    Elbow flexion 4-/5 - stops quickly  5/5  Elbow extension 4+/5 5/5  Wrist flexion    Wrist extension    Wrist ulnar deviation    Wrist radial deviation    Wrist pronation    Wrist supination    (Blank rows = not tested)  HAND FUNCTION: Grip strength: Right: 47.6, 54.0, 65.0  lbs; Left: 102.7, 109.7, 106.9  lbs, Lateral pinch: Right: 32 lbs, Left: 32 lbs, 3 point pinch: Right: 20 lbs, Left: 20,  lbs, and Tip pinch: Right 17 lbs, Left: 18 lbs  Grip Strength Average: Right: 55.5 lbs Left: 106.4  lbs  Pinching motions do not cause pain but gripping does.  COORDINATION: NT  SENSATION: WFL  EDEMA: NA  COGNITION: Overall cognitive status: Within functional limits for tasks assessed  OBSERVATIONS at Eval: Pt is a fit gentleman with no balance deficits or obvious coordination impairments who presents with palpable tenderness at the lateral epicondyle and obvious UE strength deficits of his dominant R UE compared to LUE.  TODAY'S TREATMENT:                                                                                                                               -  Therapeutic exercises/activities: completed with RUE extremity as noted below including: To increase RUE strengthening and stability, pt participated in overhand toss-and-catch of 5 lb. ball to trampoline progressing from BUE to RUE only x10 reps. Pt reported some discomfort today and task was not progressed to heavier ball.  To increase RUE strengthening and stability, pt participated in resistance therapy bar tasks (x 10 reps. per exercise with beige bar), including wrist flex/ext to twist therapy bar, wrist radial/ulnar deviation, tap bar on tabletop, and vertical oscillations. Pt assisted with joint protection strategies and energy conservation strategies to prevent fatigue d/t repetitive movements, ie) wrist was stabilized in neutral to simulate wearing splint while resistance bar was bent to simulate pulling down on a fishing pole with pt aware of increased comfort with wrist neutral verus ulnarly deviated.  Pt also encouraged to consider small wrist wrap when having to shake hands etc to give others a visual cue to take it easy as he has discomfort at base of index finger. Pt placed RUE in parffin during reviewe of HEP and jt protection ideas. Pt utilized paraffin to help decrease pain/stiffness of affected extremity by use of thermal properties of wax. Pt had some good relief upon removing hand from wax and is encouraged  to assess effectiveness over time upon returning home for possible home use considerations.    PATIENT EDUCATION: Education details: Reviewed HEP and Jt protection Person educated: Patient Education method: Explanation, Demonstration, and Verbal cues Education comprehension: verbalized understanding and returned demonstration  HOME EXERCISE PROGRAM: 02/16/23 - Epicondylitis Conservative Treatment handout 03/01/23 - Lateral Epicondylitis Exercise Program - wrist flex/ext, Forearm pro/sup, grip and finger ext 03/09/23 - Ice massage & Medial Epicondylitis Exercises (active strengthening) 03/15/23 - Sleep positions, Joint protection Principles 03/17/23 - Eccentric Forearm Strengthening 03/30/23 - Access Code: [email or web address] - resistance bar 04/07/23 - Lateral Epicondylitis strengthening 04/18/23 - Theraband Exercises: Access Code: [email or web address]  GOALS: Goals reviewed with patient? Yes  SHORT TERM GOALS: Target date: 03/18/23  Patient will demonstrate ROM HEP with 25% verbal cues or less for proper execution. Baseline: New injury Goal status: MET  2.  Patient will demonstrate at least 75 lbs RUE grip strength as needed to open jars and other containers. Baseline: Right: 47.6, 54.0, 65.0 Average 55.5 lbs 03/15/2023: 96.7 lbs Goal status: MET  3.  Patient will report pain in the RUE <5/10 with use. Baseline: 10/10 with use 03/15/2023 - up to 5/10 pain with use Goal status: IN Progress 04/06/23 - Aching at 4/10 (at rest)  4.  Patient will be able to carry 10 lbs with elbow flexed without difficulty.  Baseline: Using L hand to carry briefcase. Goal status: MET   LONG TERM GOALS: Target date: 04/15/23  Patient will demonstrate HEP including use of home based modalities with Mod I proper execution. Baseline: New Injury Goal status: IN Progress  2.  Patient will demonstrate at least 90+ lbs RUE grip strength. Baseline: Right: 47.6, 54.0, 65.0 Average 55.5 lbs 03/15/2023: 96.7 lbs Goal status:  MET 04/06/23 - 85-88 lbs s/p increased discomfort in arm this week  3.  Patient will be able to lift 14 lb bowling ball to resume leisure activity and will report no more than mild difficulty. Baseline: unable to bowl per UEFI, 10/10 pain Goal status: IN Progress  4.  Patient will be aware of 3 joint protection, ergonomics, and body mechanic principles as needed to improve UE pain.  Baseline: 10/10 pain and unable to bowl. Goal status: MET  ASSESSMENT:  CLINICAL IMPRESSION: Patient demonstrates ongoing progress of rest and recovery for R UE over the past week and has decreased pain and increased ease of picking up objects.  Pt will need to be careful about progression of activities due to the longevity and fluctuations in symptoms, pain level and discomfort with certain tasks.  Skilled OT services to continue to maximize functional use of R UE with decreased pain for resuming leisure activities and increased ease with shaking hands.  PERFORMANCE DEFICITS: in functional skills including IADLs, strength, pain, Gross motor control, and UE functional use.  IMPAIRMENTS: are limiting patient from IADLs and leisure.   COMORBIDITIES: may have co-morbidities  that affects occupational performance. Patient will benefit from skilled OT to address above impairments and improve overall function.  REHAB POTENTIAL: Excellent  PLAN:  OT FREQUENCY: 1x/week  OT DURATION: additional 4-6 weeks  PLANNED INTERVENTIONS: therapeutic exercise, therapeutic activity, manual therapy, electrical stimulation, ultrasound, iontophoresis, coping strategies training, and DME and/or AE instructions  RECOMMENDED OTHER SERVICES: NA  CONSULTED AND AGREED WITH PLAN OF CARE: Patient  PLANs FOR NEXT SESSION:  review IASTM (he did get his tools) Home Modality options vs therapy - ultrasound/estim; heat/ice Review stretching/HEP progression, isometrics,  Will need to explore modifications for bowling to prevent further  exacerbations Explore taping options.   Victorino Sparrow, OT 04/27/2023, 6:27 PM

## 2023-05-04 ENCOUNTER — Emergency Department (HOSPITAL_COMMUNITY): Payer: 59

## 2023-05-04 ENCOUNTER — Emergency Department (HOSPITAL_COMMUNITY)
Admission: EM | Admit: 2023-05-04 | Discharge: 2023-05-05 | Discharge status: Against Medical Advice | Payer: 59 | Attending: Emergency Medicine | Admitting: Emergency Medicine

## 2023-05-04 ENCOUNTER — Other Ambulatory Visit: Payer: Self-pay

## 2023-05-04 DIAGNOSIS — Z5321 Procedure and treatment not carried out due to patient leaving prior to being seen by health care provider: Secondary | ICD-10-CM | POA: Insufficient documentation

## 2023-05-04 DIAGNOSIS — R079 Chest pain, unspecified: Secondary | ICD-10-CM | POA: Insufficient documentation

## 2023-05-04 DIAGNOSIS — R0602 Shortness of breath: Secondary | ICD-10-CM | POA: Diagnosis not present

## 2023-05-04 DIAGNOSIS — R110 Nausea: Secondary | ICD-10-CM | POA: Insufficient documentation

## 2023-05-04 LAB — TROPONIN I (HIGH SENSITIVITY)
Troponin I (High Sensitivity): 4 ng/L (ref <18)
Troponin I (High Sensitivity): 3 ng/L (ref <18)

## 2023-05-04 LAB — CBC
HCT: 44.9 % (ref 39.0–52.0)
Hemoglobin: 16 g/dL (ref 13.0–17.0)
MCH: 32.3 pg (ref 26.0–34.0)
MCHC: 35.6 g/dL (ref 30.0–36.0)
MCV: 90.7 fL (ref 80.0–100.0)
Platelets: 185 10*3/uL (ref 150–400)
RBC: 4.95 MIL/uL (ref 4.22–5.81)
RDW: 12.1 % (ref 11.5–15.5)
WBC: 8.7 10*3/uL (ref 4.0–10.5)
nRBC: 0 % (ref 0.0–0.2)

## 2023-05-04 LAB — BASIC METABOLIC PANEL
Anion gap: 13 (ref 5–15)
BUN: 11 mg/dL (ref 6–20)
CO2: 21 mmol/L — ABNORMAL LOW (ref 22–32)
Calcium: 9.8 mg/dL (ref 8.9–10.3)
Chloride: 104 mmol/L (ref 98–111)
Creatinine, Ser: 1.13 mg/dL (ref 0.61–1.24)
GFR, Estimated: >60 mL/min (ref >60)
Glucose, Bld: 99 mg/dL (ref 70–99)
Potassium: 3.9 mmol/L (ref 3.5–5.1)
Sodium: 138 mmol/L (ref 135–145)

## 2023-05-04 LAB — PROTIME-INR
INR: 1 (ref 0.8–1.2)
Prothrombin Time: 13.1 s (ref 11.4–15.2)

## 2023-05-04 NOTE — ED Triage Notes (Signed)
Patient reports sudden onset left chest pain this evening with SOB  and nausea .

## 2023-05-05 ENCOUNTER — Encounter: Payer: Self-pay | Admitting: Internal Medicine

## 2023-05-05 ENCOUNTER — Ambulatory Visit: Payer: 59 | Admitting: Occupational Therapy

## 2023-05-05 ENCOUNTER — Ambulatory Visit: Payer: 59 | Admitting: Internal Medicine

## 2023-05-05 VITALS — BP 148/86 | HR 70 | Temp 98.2°F | Ht 67.0 in | Wt 201.0 lb

## 2023-05-05 DIAGNOSIS — E559 Vitamin D deficiency, unspecified: Secondary | ICD-10-CM | POA: Diagnosis not present

## 2023-05-05 DIAGNOSIS — J309 Allergic rhinitis, unspecified: Secondary | ICD-10-CM

## 2023-05-05 DIAGNOSIS — E785 Hyperlipidemia, unspecified: Secondary | ICD-10-CM | POA: Diagnosis not present

## 2023-05-05 DIAGNOSIS — F410 Panic disorder [episodic paroxysmal anxiety] without agoraphobia: Secondary | ICD-10-CM

## 2023-05-05 DIAGNOSIS — I10 Essential (primary) hypertension: Secondary | ICD-10-CM

## 2023-05-05 MED ORDER — PANTOPRAZOLE SODIUM 40 MG PO TBEC: 40.0000 mg | DELAYED_RELEASE_TABLET | Freq: Every day | ORAL | 0 refills | Status: DC

## 2023-05-05 MED ORDER — SUCRALFATE 1 G PO TABS: 1.0000 g | ORAL_TABLET | Freq: Three times a day (TID) | ORAL | 0 refills | Status: DC

## 2023-05-05 MED ORDER — DILTIAZEM HCL ER COATED BEADS 360 MG PO CP24: 360.0000 mg | ORAL_CAPSULE | Freq: Every day | ORAL | 3 refills | Status: DC

## 2023-05-05 NOTE — Progress Notes (Signed)
Patient ID: Wayne Morales, male   DOB: [birth year], 52 y.o.   MRN: [id number]        Chief Complaint: follow up panic anxiety, htn, right tinnitus, allergies, low vit d       HPI:  Wayne Morales is a 52 y.o. male here after stopped his celexa for unclear reason about 6 wks ago, now back on for 2 wks after anxiety returned but here today with nervous concerns it seems, Does have several wks ongoing nasal allergy symptoms with clearish congestion, itch and sneezing, without fever, pain, ST, cough, swelling or wheezing, and right ear tinnitus.  Pt denies chest pain, increased sob or doe, wheezing, orthopnea, PND, increased LE swelling, palpitations, dizziness or syncope.   Pt denies polydipsia, polyuria, or new focal neuro s/s.          Wt Readings from Last 3 Encounters:  05/05/23 201 lb (91.2 kg)  03/14/23 203 lb (92.1 kg)  02/03/23 204 lb 3.2 oz (92.6 kg)   BP Readings from Last 3 Encounters:  05/05/23 (!) 148/86  05/04/23 (!) 138/90  03/14/23 122/78         Past Medical History:  Diagnosis Date   Atrial fibrillation (HCC)    pt states they "figured it was not a-fib" but does have trouble with heart valve   Chronic back pain    Hyperlipidemia    diet controlled   Hypertension    on meds   Scalp psoriasis 12/15/2012   Past Surgical History:  Procedure Laterality Date   INGUINAL HERNIA REPAIR Left    as a child    reports that he has never smoked. He has never used smokeless tobacco. He reports current alcohol use. He reports that he does not use drugs. family history includes Brain cancer in his brother; Colon cancer (age of onset: 9) in his father; Colon polyps (age of onset: 25) in his father; Diabetes in his father; Heart disease in his brother; Hyperlipidemia in his mother; Hypertension in his mother and sister; Sarcoidosis in his brother and brother; Supraventricular tachycardia in his mother. Allergies  Allergen Reactions   Penicillins Hives and Rash    REACTION: Rash    Current Outpatient Medications on File Prior to Visit  Medication Sig Dispense Refill   Ascorbic Acid (VITAMIN C PO) Take 1,000 mg by mouth daily at 6 (six) AM.     aspirin (ASPIRIN CHILDRENS) 81 MG chewable tablet Chew 1 tablet (81 mg total) by mouth daily.     Calcium 500-2.5 MG-MCG CHEW Chew by mouth.     Efinaconazole 10 % SOLN Apply 1 drop topically daily. 4 mL 11   ELDERBERRY PO Take by mouth.     hydrOXYzine (ATARAX) 25 MG tablet Take 1 tablet (25 mg total) by mouth 3 (three) times daily as needed. 60 tablet 2   olmesartan (BENICAR) 40 MG tablet Take 1 tablet by mouth once daily 30 tablet 0   omega-3 acid ethyl esters (LOVAZA) 1 g capsule Take 2 capsules by mouth twice daily 120 capsule 0   rosuvastatin (CRESTOR) 20 MG tablet Take 1 tablet by mouth once daily 90 tablet 0   citalopram (CELEXA) 40 MG tablet Take 1 tablet (40 mg total) by mouth daily. (Patient not taking: Reported on 03/22/2023) 90 tablet 3   nebivolol (BYSTOLIC) 10 MG tablet Take 1 tablet (10 mg total) by mouth daily. Take ont tab extra if SBP > 140 mm Hg (Patient not taking: Reported on 03/14/2023) 30 tablet 2  No current facility-administered medications on file prior to visit.        ROS:  All others reviewed and negative.  Objective        PE:  BP (!) 148/86 (BP Location: Left Arm, Patient Position: Sitting, Cuff Size: Normal)   Pulse 70   Temp 98.2 F (36.8 C) (Oral)   Ht 5\' 7"  (1.702 m)   Wt 201 lb (91.2 kg)   SpO2 97%   BMI 31.48 kg/m                 Constitutional: Pt appears in NAD               HENT: Head: NCAT.                Right Ear: External ear normal.                 Left Ear: External ear normal. Bilat tm's with mild erythema.  Max sinus areas non tender.  Pharynx with mild erythema, no exudate               Eyes: . Pupils are equal, round, and reactive to light. Conjunctivae and EOM are normal               Nose: without d/c or deformity               Neck: Neck supple. Gross normal ROM                Cardiovascular: Normal rate and regular rhythm.                 Pulmonary/Chest: Effort normal and breath sounds without rales or wheezing.                Abd:  Soft, NT, ND, + BS, no organomegaly               Neurological: Pt is alert. At baseline orientation, motor grossly intact               Skin: Skin is warm. No rashes, no other new lesions, LE edema - none               Psychiatric: Pt behavior is normal without agitation   Micro: none  Cardiac tracings I have personally interpreted today:  none  Pertinent Radiological findings (summarize): none   Lab Results  Component Value Date   WBC 8.7 05/04/2023   HGB 16.0 05/04/2023   HCT 44.9 05/04/2023   PLT 185 05/04/2023   GLUCOSE 99 05/04/2023   CHOL 145 01/24/2023   TRIG 208.0 (H) 01/24/2023   HDL 30.60 (L) 01/24/2023   LDLDIRECT 55.0 01/24/2023   LDLCALC 92 02/12/2022   ALT 42 01/24/2023   AST 32 01/24/2023   NA 138 05/04/2023   K 3.9 05/04/2023   CL 104 05/04/2023   CREATININE 1.13 05/04/2023   BUN 11 05/04/2023   CO2 21 (L) 05/04/2023   TSH 0.62 04/27/2022   PSA 0.5 09/06/2022   INR 1.0 05/04/2023   HGBA1C 5.2 01/24/2023   Assessment/Plan:  Wayne Morales is a 52 y.o. Other or two or more races [6] male with  has a past medical history of Atrial fibrillation (HCC), Chronic back pain, Hyperlipidemia, Hypertension, and Scalp psoriasis (12/15/2012).  Panic anxiety syndrome D/wpt - uncontrolled but needs to be back on celexa for at least 3-4 wks to be fully effective again - to continue celexa 40 qd  Allergic rhinitis Mild to mod, for allegra 180 every day prn, nasacort asd,  to f/u any worsening symptoms or concerns  Essential hypertension BP Readings from Last 3 Encounters:  05/05/23 (!) 148/86  05/04/23 (!) 138/90  03/14/23 122/78   Uncontrolled, likely reactive, pt to continue medical treatment card cd 360 every day, benicar 40 qd   Hyperlipidemia with target LDL less than 160 Lab Results   Component Value Date   LDLCALC 92 02/12/2022   Uncontrolled, pt to continue current statin crestor 20 every day, for lower chol diet, declines change today, for f/u lab with pcp soon   Vitamin D deficiency disease Last vitamin D Lab Results  Component Value Date   VD25OH 13.33 (L) 04/01/2021   Low, to start oral replacement  Followup: Return if symptoms worsen or fail to improve.  Oliver Barre, MD 05/08/2023 6:36 PM [facility]

## 2023-05-05 NOTE — ED Notes (Signed)
Pt left. Stated they have an appointment in the morning and no longer wanted to wait anymore.

## 2023-05-05 NOTE — Patient Instructions (Addendum)
Ok to increase the diltiazem to 360 mg per day  Please take all new medication as reommended - the allegra 180 mg per day, and nasacort and mucinex 1200 mg twice per day for at least 1 week  Remember the celexa will need another 2 wks to have full effect  Please continue all other medications as before, and refills have been done if requested.  Please have the pharmacy call with any other refills you may need.  Please continue your efforts at being more active, low cholesterol diet, and weight control.  You are otherwise up to date with prevention measures today.  Please keep your appointments with your specialists as you may have planned

## 2023-05-08 DIAGNOSIS — J309 Allergic rhinitis, unspecified: Secondary | ICD-10-CM | POA: Insufficient documentation

## 2023-05-08 NOTE — Assessment & Plan Note (Signed)
Lab Results  Component Value Date   LDLCALC 92 02/12/2022   Uncontrolled, pt to continue current statin crestor 20 every day, for lower chol diet, declines change today, for f/u lab with pcp soon

## 2023-05-08 NOTE — Assessment & Plan Note (Signed)
D/wpt - uncontrolled but needs to be back on celexa for at least 3-4 wks to be fully effective again - to continue celexa 40 qd

## 2023-05-08 NOTE — Assessment & Plan Note (Signed)
Last vitamin D Lab Results  Component Value Date   VD25OH 13.33 (L) 04/01/2021   Low, to start oral replacement

## 2023-05-08 NOTE — Assessment & Plan Note (Signed)
Mild to mod, for allegra 180 every day prn, nasacort asd,  to f/u any worsening symptoms or concerns

## 2023-05-08 NOTE — Assessment & Plan Note (Signed)
BP Readings from Last 3 Encounters:  05/05/23 (!) 148/86  05/04/23 (!) 138/90  03/14/23 122/78   Uncontrolled, likely reactive, pt to continue medical treatment card cd 360 every day, benicar 40 qd

## 2023-05-11 ENCOUNTER — Ambulatory Visit: Payer: 59 | Admitting: Occupational Therapy

## 2023-05-24 ENCOUNTER — Other Ambulatory Visit: Payer: Self-pay | Admitting: Cardiology

## 2023-05-24 DIAGNOSIS — I10 Essential (primary) hypertension: Secondary | ICD-10-CM

## 2023-06-20 ENCOUNTER — Ambulatory Visit: Payer: 59 | Admitting: Adult Health

## 2023-06-20 ENCOUNTER — Telehealth: Payer: Self-pay | Admitting: Neurology

## 2023-06-20 NOTE — Telephone Encounter (Signed)
Pt has been r/s for his initial CPAP f/u, pt aware to bring CPAP and power cord on 12/30, he is aware to arrive at 10:45, this is FYI no call back requested

## 2023-06-21 ENCOUNTER — Encounter: Payer: Self-pay | Admitting: Adult Health

## 2023-06-25 ENCOUNTER — Other Ambulatory Visit: Payer: Self-pay | Admitting: Cardiology

## 2023-06-25 DIAGNOSIS — I10 Essential (primary) hypertension: Secondary | ICD-10-CM

## 2023-06-25 DIAGNOSIS — R002 Palpitations: Secondary | ICD-10-CM

## 2023-07-13 ENCOUNTER — Other Ambulatory Visit: Payer: Self-pay | Admitting: Internal Medicine

## 2023-07-13 DIAGNOSIS — E785 Hyperlipidemia, unspecified: Secondary | ICD-10-CM

## 2023-07-18 ENCOUNTER — Encounter: Payer: Self-pay | Admitting: Adult Health

## 2023-07-18 ENCOUNTER — Ambulatory Visit: Payer: 59 | Admitting: Adult Health

## 2023-07-18 VITALS — BP 127/76 | HR 64 | Ht 67.0 in | Wt 213.0 lb

## 2023-07-18 DIAGNOSIS — G4710 Hypersomnia, unspecified: Secondary | ICD-10-CM | POA: Diagnosis not present

## 2023-07-18 DIAGNOSIS — R6889 Other general symptoms and signs: Secondary | ICD-10-CM | POA: Diagnosis not present

## 2023-07-18 DIAGNOSIS — G4733 Obstructive sleep apnea (adult) (pediatric): Secondary | ICD-10-CM | POA: Diagnosis not present

## 2023-07-18 DIAGNOSIS — R400 Somnolence: Secondary | ICD-10-CM

## 2023-07-18 DIAGNOSIS — G478 Other sleep disorders: Secondary | ICD-10-CM

## 2023-07-18 DIAGNOSIS — G4719 Other hypersomnia: Secondary | ICD-10-CM

## 2023-07-18 NOTE — Patient Instructions (Addendum)
Your Plan:  Continue nightly use of CPAP for adequate sleep apnea management  You will be called to schedule further testing to evaluate for underlying hypersomnolence disorder such as narcolepsy or idiopathic hypersomnia. Please ensure your citalopram is stopped at least 2 weeks prior to testing    Follow-up in 6 months or call earlier if needed      Thank you for coming to see [place] at [facility]. I hope we have been able to provide you high quality care today.  You may receive a patient satisfaction survey over the next few weeks. We would appreciate your feedback and comments so that we may continue to improve ourselves and the health of our patients.

## 2023-07-18 NOTE — Progress Notes (Signed)
[facility] [address] [place]. [place] [address] [phone]       OFFICE FOLLOW UP NOTE  [name] Date of Birth:  1970/07/27 Medical Record Number:  [id number]    Primary neurologist: Dr. Frances Morales Reason for visit: Initial CPAP follow-up    SUBJECTIVE:   CHIEF COMPLAINT:  Chief Complaint  Patient presents with   Obstructive Sleep Apnea    Rm 3 alone Pt is well, reports he doesn't feel much different with CPAP use. Otherwise he is stable, no other concerns.     Follow-up visit:  Prior visit: 03/14/2023 with Dr. Frances Morales (initial consult visit)  Brief HPI:   Wayne Morales is a 52 y.o. male who was evaluated by Dr. Frances Morales on 03/14/2023 for concern of underlying sleep apnea with complaints of snoring and excessive daytime somnolence as well as intermittent sleep paralysis, vivid dreams and RLS.  ESS 17/24, FSS 38/63.  Concern for possible OSA and/or possible hypersomnolence disorder such as narcolepsy vs idiopathic hypersomnolence.  Recommended pursuing with nocturnal polysomnogram and next-day MLST.  Discussed gradually tapering off Celexa in order to pursue further testing, discussed possibility of Celexa contributing to RLS symptoms.  Insurance declined nocturnal polysomnogram and MLST therefore proceeded with HST completed 04/05/2023 which showed overall mild OSA with total AHI 13.4/h and O2 nadir of 81%.  Recommend initiation of AutoPap.  AutoPap set up 05/16/2023. DME [facility].      Interval history:  Compliance report shows excellent usage and optimal residual AHI. He is tolerating CPAP well.  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 ESS 15/24 (prior 17/24). He does note improvement of RLS/PLMD symptoms since starting CPAP.  He would be interested in pursuing further  testing if able to get approved by insurance to evaluate for underlying hypersomnolence disorder at this time.         ROS:   14 system review of systems performed and negative with exception of those listed in HPI  PMH:  Past Medical History:  Diagnosis Date   Atrial fibrillation (HCC)    pt states they "figured it was not a-fib" but does have trouble with heart valve   Chronic back pain    Hyperlipidemia    diet controlled   Hypertension    on meds   Scalp psoriasis 12/15/2012    PSH:  Past Surgical History:  Procedure Laterality Date   INGUINAL HERNIA REPAIR Left    as a child    Social History:  Social History   Socioeconomic History   Marital status: Married    Spouse name: stephanie   Number of children: 1   Years of education: Not on file   Highest education level: Not on file  Occupational History   Not on file  Tobacco Use   Smoking status: Never   Smokeless tobacco: Never  Vaping Use   Vaping status: Never Used  Substance and Sexual Activity   Alcohol use: Yes    Comment: socially   Drug use: Never   Sexual activity: Yes  Other Topics Concern   Not on file  Social History Narrative   Child passed due to brain and spinal cancer.      Right handed   Caffeine: occasional    Social Drivers of Corporate investment banker Strain: Not on file  Food  Insecurity: Not on file  Transportation Needs: Not on file  Physical Activity: Not on file  Stress: Not on file  Social Connections: Not on file  Intimate Partner Violence: Not on file    Family History:  Family History  Problem Relation Age of Onset   Hypertension Mother    Hyperlipidemia Mother    Supraventricular tachycardia Mother    Diabetes Father    Colon polyps Father 44   Colon cancer Father 34   Hypertension Sister    Brain cancer Brother    Heart disease Brother    Sarcoidosis Brother    Sarcoidosis Brother    Sleep apnea Neg Hx     Medications:   Current Outpatient  Medications on File Prior to Visit  Medication Sig Dispense Refill   Ascorbic Acid (VITAMIN C PO) Take 1,000 mg by mouth daily at 6 (six) AM.     aspirin (ASPIRIN CHILDRENS) 81 MG chewable tablet Chew 1 tablet (81 mg total) by mouth daily.     Calcium 500-2.5 MG-MCG CHEW Chew by mouth.     citalopram (CELEXA) 40 MG tablet Take 1 tablet (40 mg total) by mouth daily. 90 tablet 3   diltiazem (CARDIZEM CD) 360 MG 24 hr capsule Take 1 capsule (360 mg total) by mouth daily. 90 capsule 3   Efinaconazole 10 % SOLN Apply 1 drop topically daily. 4 mL 11   ELDERBERRY PO Take by mouth.     hydrOXYzine (ATARAX) 25 MG tablet Take 1 tablet (25 mg total) by mouth 3 (three) times daily as needed. 60 tablet 2   olmesartan (BENICAR) 40 MG tablet Take 1 tablet by mouth once daily 90 tablet 2   omega-3 acid ethyl esters (LOVAZA) 1 g capsule Take 2 capsules by mouth twice daily 120 capsule 0   pantoprazole (PROTONIX) 40 MG tablet Take 1 tablet (40 mg total) by mouth daily. 30 tablet 0   rosuvastatin (CRESTOR) 20 MG tablet Take 1 tablet by mouth once daily 90 tablet 0   No current facility-administered medications on file prior to visit.    Allergies:   Allergies  Allergen Reactions   Penicillins Hives and Rash    REACTION: Rash      OBJECTIVE:  Physical Exam  Vitals:   07/18/23 1121  BP: 127/76  Pulse: 64  Weight: 213 lb (96.6 kg)  Height: 5\' 7"  (1.702 m)   Body mass index is 33.36 kg/m. No results found.  General: well developed, well nourished, seated, in no evident distress  Neurologic Exam Mental Status: Awake and fully alert. Oriented to place and time. Recent and remote memory intact. Attention span, concentration and fund of knowledge appropriate. Mood and affect appropriate.  Cranial Nerves: Pupils equal, briskly reactive to light. Extraocular movements full without nystagmus. Visual fields full to confrontation. Hearing intact. Facial sensation intact. Face, tongue, palate moves  normally and symmetrically.  Motor: Normal bulk and tone. Normal strength in all tested extremity muscles Gait and Station: Arises from chair without difficulty. Stance is normal. Gait demonstrates normal stride length and balance without use of AD.         ASSESSMENT/PLAN: Wayne Morales is a 52 y.o. year old male    OSA on CPAP : Compliance report shows satisfactory usage with optimal residual AHI.  Continue current pressure settings.  Discussed continued nightly usage with ensuring greater than 4 hours nightly for optimal benefit and per insurance purposes.  Continue to follow with DME company for any needed supplies  or CPAP related concerns Hypersomnolence: Symptoms of daytime fatigue, easily falling asleep and vivid dreams persist despite use of adequate CPAP therapy. Will place order for CPAP titration and MLST to evaluate for underlying hypersomnolence disorder (previously denied by insurance requiring testing with HST first). He was advised that he will need to stop his citalopram 2 weeks prior to testing     Follow up in  6 months or call earlier if needed   CC:  PCP: Etta Grandchild, MD    I spent 25 minutes of face-to-face and non-face-to-face time with patient.  This included previsit chart review, lab review, study review, order entry, electronic health record documentation, patient education and discussion regarding above diagnoses and treatment plan and answered all other questions to patient's satisfaction  Ihor Austin, [facility]  [name] [address] [place], [place] [address]  Phone [phone] Fax [phone] Note: This document was prepared with digital dictation and possible smart phrase technology. Any transcriptional errors that result from this process are unintentional.

## 2023-07-21 ENCOUNTER — Other Ambulatory Visit: Payer: Self-pay | Admitting: Internal Medicine

## 2023-07-21 ENCOUNTER — Telehealth: Payer: Self-pay

## 2023-07-21 ENCOUNTER — Other Ambulatory Visit: Payer: Self-pay | Admitting: Cardiology

## 2023-07-21 DIAGNOSIS — R931 Abnormal findings on diagnostic imaging of heart and coronary circulation: Secondary | ICD-10-CM

## 2023-07-21 DIAGNOSIS — E785 Hyperlipidemia, unspecified: Secondary | ICD-10-CM

## 2023-07-21 DIAGNOSIS — E781 Pure hyperglyceridemia: Secondary | ICD-10-CM

## 2023-07-22 ENCOUNTER — Other Ambulatory Visit: Payer: Self-pay

## 2023-07-22 ENCOUNTER — Encounter: Payer: Self-pay | Admitting: Internal Medicine

## 2023-07-22 DIAGNOSIS — R931 Abnormal findings on diagnostic imaging of heart and coronary circulation: Secondary | ICD-10-CM

## 2023-07-22 DIAGNOSIS — E781 Pure hyperglyceridemia: Secondary | ICD-10-CM

## 2023-07-22 MED ORDER — OMEGA-3-ACID ETHYL ESTERS 1 G PO CAPS
2.0000 | ORAL_CAPSULE | Freq: Two times a day (BID) | ORAL | 3 refills | Status: DC
Start: 2023-07-22 — End: 2023-11-17
  Filled 2023-07-22: qty 120, 30d supply, fill #0
  Filled 2023-09-01: qty 360, 90d supply, fill #0
  Filled 2023-10-19: qty 120, 30d supply, fill #0

## 2023-07-22 MED ORDER — ASPIRIN 81 MG PO CHEW
81.0000 mg | CHEWABLE_TABLET | Freq: Every day | ORAL | 3 refills | Status: DC
  Filled 2023-07-22: qty 90, 90d supply, fill #0

## 2023-07-22 MED ORDER — ASPIRIN 81 MG PO CHEW
81.0000 mg | CHEWABLE_TABLET | Freq: Every day | ORAL | 3 refills | Status: DC
  Filled 2023-07-22: qty 30, 30d supply, fill #0
  Filled 2023-09-01: qty 90, 90d supply, fill #0

## 2023-07-22 MED ORDER — ASPIRIN 81 MG PO CHEW: 81.0000 mg | CHEWABLE_TABLET | Freq: Every day | ORAL | Status: DC

## 2023-08-01 ENCOUNTER — Telehealth: Payer: Self-pay | Admitting: Adult Health

## 2023-08-01 NOTE — Telephone Encounter (Signed)
 CPAP/MSLT [employer] pending uploaded notes.

## 2023-08-02 ENCOUNTER — Other Ambulatory Visit: Payer: Self-pay

## 2023-08-03 ENCOUNTER — Ambulatory Visit: Payer: 59 | Admitting: Internal Medicine

## 2023-08-03 ENCOUNTER — Encounter: Payer: Self-pay | Admitting: Internal Medicine

## 2023-08-03 ENCOUNTER — Ambulatory Visit (INDEPENDENT_AMBULATORY_CARE_PROVIDER_SITE_OTHER): Payer: 59

## 2023-08-03 VITALS — BP 138/78 | HR 71 | Temp 98.6°F | Resp 16 | Ht 67.0 in | Wt 217.6 lb

## 2023-08-03 DIAGNOSIS — I10 Essential (primary) hypertension: Secondary | ICD-10-CM

## 2023-08-03 DIAGNOSIS — M79645 Pain in left finger(s): Secondary | ICD-10-CM

## 2023-08-03 DIAGNOSIS — E785 Hyperlipidemia, unspecified: Secondary | ICD-10-CM

## 2023-08-03 LAB — BASIC METABOLIC PANEL
BUN: 15 mg/dL (ref 6–23)
CO2: 30 meq/L (ref 19–32)
Calcium: 10.4 mg/dL (ref 8.4–10.5)
Chloride: 104 meq/L (ref 96–112)
Creatinine, Ser: 1.27 mg/dL (ref 0.40–1.50)
GFR: 65.06 mL/min (ref >60.00)
Glucose, Bld: 66 mg/dL — ABNORMAL LOW (ref 70–99)
Potassium: 3.8 meq/L (ref 3.5–5.1)
Sodium: 141 meq/L (ref 135–145)

## 2023-08-03 MED ORDER — ROSUVASTATIN CALCIUM 20 MG PO TABS
20.0000 mg | ORAL_TABLET | Freq: Every day | ORAL | 1 refills | Status: DC
Start: 2023-08-03 — End: 2024-01-25

## 2023-08-03 NOTE — Progress Notes (Signed)
Subjective:  Patient ID: Wayne Morales, male    DOB: [birth year]  Age: 53 y.o. MRN: [id number]  CC: Hypertension   HPI Wayne Morales presents for f/up ---  He is active and denies DOE, CP, SOB, edema.  Outpatient Medications Prior to Visit  Medication Sig Dispense Refill   Ascorbic Acid (VITAMIN C PO) Take 1,000 mg by mouth daily at 6 (six) AM.     aspirin (ASPIRIN CHILDRENS) 81 MG chewable tablet Chew 1 tablet (81 mg total) by mouth daily. 90 tablet 3   Calcium 500-2.5 MG-MCG CHEW Chew by mouth.     citalopram (CELEXA) 40 MG tablet Take 1 tablet (40 mg total) by mouth daily. 90 tablet 3   diltiazem (CARDIZEM CD) 360 MG 24 hr capsule Take 1 capsule (360 mg total) by mouth daily. 90 capsule 3   Efinaconazole 10 % SOLN Apply 1 drop topically daily. 4 mL 11   ELDERBERRY PO Take by mouth.     hydrOXYzine (ATARAX) 25 MG tablet Take 1 tablet (25 mg total) by mouth 3 (three) times daily as needed. 60 tablet 2   olmesartan (BENICAR) 40 MG tablet Take 1 tablet by mouth once daily 90 tablet 2   omega-3 acid ethyl esters (LOVAZA) 1 g capsule Take 2 capsules (2 g total) by mouth 2 (two) times daily. 360 capsule 3   pantoprazole (PROTONIX) 40 MG tablet Take 1 tablet (40 mg total) by mouth daily. 30 tablet 0   rosuvastatin (CRESTOR) 20 MG tablet Take 1 tablet by mouth once daily 90 tablet 0   No facility-administered medications prior to visit.    ROS Review of Systems  Constitutional: Negative.  Negative for appetite change, diaphoresis, fatigue and unexpected weight change.  HENT: Negative.    Eyes: Negative.   Respiratory: Negative.  Negative for cough, chest tightness, shortness of breath and wheezing.   Cardiovascular:  Negative for chest pain, palpitations and leg swelling.  Gastrointestinal:  Negative for abdominal pain, constipation, diarrhea, nausea and vomiting.  Genitourinary: Negative.  Negative for difficulty urinating.  Musculoskeletal:  Positive for arthralgias. Negative  for back pain, myalgias and neck pain.  Skin: Negative.   Neurological: Negative.  Negative for dizziness, weakness and headaches.  Hematological:  Negative for adenopathy. Does not bruise/bleed easily.  Psychiatric/Behavioral: Negative.      Objective:  BP 138/78 (BP Location: Left Arm, Patient Position: Sitting, Cuff Size: Large)   Pulse 71   Temp 98.6 F (37 C) (Oral)   Resp 16   Ht 5\' 7"  (1.702 m)   Wt 217 lb 9.6 oz (98.7 kg)   SpO2 96%   BMI 34.08 kg/m   BP Readings from Last 3 Encounters:  08/03/23 138/78  07/18/23 127/76  05/05/23 (!) 148/86    Wt Readings from Last 3 Encounters:  08/03/23 217 lb 9.6 oz (98.7 kg)  07/18/23 213 lb (96.6 kg)  05/05/23 201 lb (91.2 kg)    Physical Exam Vitals reviewed.  Constitutional:      Appearance: Normal appearance.  HENT:     Nose: Nose normal.     Mouth/Throat:     Mouth: Mucous membranes are moist.  Eyes:     General: No scleral icterus.    Conjunctiva/sclera: Conjunctivae normal.  Cardiovascular:     Rate and Rhythm: Normal rate and regular rhythm.     Heart sounds: No murmur heard. Pulmonary:     Effort: Pulmonary effort is normal.     Breath sounds: No stridor. No  wheezing, rhonchi or rales.  Abdominal:     General: Abdomen is flat.     Palpations: There is no mass.     Tenderness: There is no abdominal tenderness. There is no guarding.     Hernia: No hernia is present.  Musculoskeletal:        General: Normal range of motion.     Cervical back: Neck supple.     Right lower leg: No edema.     Left lower leg: No edema.     Comments: Boney growth base of left thumb protruding from the medial aspect of the MCP joint  Lymphadenopathy:     Cervical: No cervical adenopathy.  Skin:    General: Skin is warm and dry.  Neurological:     General: No focal deficit present.     Mental Status: He is alert. Mental status is at baseline.  Psychiatric:        Mood and Affect: Mood normal.        Behavior: Behavior  normal.     Lab Results  Component Value Date   WBC 8.7 05/04/2023   HGB 16.0 05/04/2023   HCT 44.9 05/04/2023   PLT 185 05/04/2023   GLUCOSE 66 (L) 08/03/2023   CHOL 145 01/24/2023   TRIG 208.0 (H) 01/24/2023   HDL 30.60 (L) 01/24/2023   LDLDIRECT 55.0 01/24/2023   LDLCALC 92 02/12/2022   ALT 42 01/24/2023   AST 32 01/24/2023   NA 141 08/03/2023   K 3.8 08/03/2023   CL 104 08/03/2023   CREATININE 1.27 08/03/2023   BUN 15 08/03/2023   CO2 30 08/03/2023   TSH 0.62 04/27/2022   PSA 0.5 09/06/2022   INR 1.0 05/04/2023   HGBA1C 5.2 01/24/2023    DG Chest 2 View Result Date: 05/04/2023 CLINICAL DATA:  Chest pain, nausea and shortness of breath. EXAM: CHEST - 2 VIEW COMPARISON:  PA and lateral 08/19/2022 FINDINGS: The heart size and mediastinal contours are within normal limits. Both lungs are clear. The visualized skeletal structures are unremarkable. IMPRESSION: No active cardiopulmonary disease.  Stable chest. Electronically Signed   By: Almira Bar M.D.   On: 05/04/2023 23:11    DG Finger Thumb Left Result Date: 08/03/2023 CLINICAL DATA:  Left thumb pain EXAM: LEFT THUMB 2+V COMPARISON:  08/10/2011 FINDINGS: There is no evidence of fracture or dislocation. There is no evidence of arthropathy or other focal bone abnormality. Soft tissues are unremarkable. IMPRESSION: Negative. Electronically Signed   By: Duanne Guess D.O.   On: 08/03/2023 16:55     Assessment & Plan:   Essential hypertension- His BP is well controlled. -     Basic metabolic panel; Future  Hyperlipidemia with target LDL less than 160- LDL goal achieved. Doing well on the statin  -     Rosuvastatin Calcium; Take 1 tablet (20 mg total) by mouth daily.  Dispense: 90 tablet; Refill: 1  Pain of left thumb -     DG Finger Thumb Left; Future     Follow-up: Return in about 6 months (around 01/31/2024).  Sanda Linger, MD

## 2023-08-03 NOTE — Patient Instructions (Signed)
 Hypertension, Adult High blood pressure (hypertension) is when the force of blood pumping through the arteries is too strong. The arteries are the blood vessels that carry blood from the heart throughout the body. Hypertension forces the heart to work harder to pump blood and may cause arteries to become narrow or stiff. Untreated or uncontrolled hypertension can lead to a heart attack, heart failure, a stroke, kidney disease, and other problems. A blood pressure reading consists of a higher number over a lower number. Ideally, your blood pressure should be below 120/80. The first ("top") number is called the systolic pressure. It is a measure of the pressure in your arteries as your heart beats. The second ("bottom") number is called the diastolic pressure. It is a measure of the pressure in your arteries as the heart relaxes. What are the causes? The exact cause of this condition is not known. There are some conditions that result in high blood pressure. What increases the risk? Certain factors may make you more likely to develop high blood pressure. Some of these risk factors are under your control, including: Smoking. Not getting enough exercise or physical activity. Being overweight. Having too much fat, sugar, calories, or salt (sodium) in your diet. Drinking too much alcohol. Other risk factors include: Having a personal history of heart disease, diabetes, high cholesterol, or kidney disease. Stress. Having a family history of high blood pressure and high cholesterol. Having obstructive sleep apnea. Age. The risk increases with age. What are the signs or symptoms? High blood pressure may not cause symptoms. Very high blood pressure (hypertensive crisis) may cause: Headache. Fast or irregular heartbeats (palpitations). Shortness of breath. Nosebleed. Nausea and vomiting. Vision changes. Severe chest pain, dizziness, and seizures. How is this diagnosed? This condition is diagnosed by  measuring your blood pressure while you are seated, with your arm resting on a flat surface, your legs uncrossed, and your feet flat on the floor. The cuff of the blood pressure monitor will be placed directly against the skin of your upper arm at the level of your heart. Blood pressure should be measured at least twice using the same arm. Certain conditions can cause a difference in blood pressure between your right and left arms. If you have a high blood pressure reading during one visit or you have normal blood pressure with other risk factors, you may be asked to: Return on a different day to have your blood pressure checked again. Monitor your blood pressure at home for 1 week or longer. If you are diagnosed with hypertension, you may have other blood or imaging tests to help your health care provider understand your overall risk for other conditions. How is this treated? This condition is treated by making healthy lifestyle changes, such as eating healthy foods, exercising more, and reducing your alcohol intake. You may be referred for counseling on a healthy diet and physical activity. Your health care provider may prescribe medicine if lifestyle changes are not enough to get your blood pressure under control and if: Your systolic blood pressure is above 130. Your diastolic blood pressure is above 80. Your personal target blood pressure may vary depending on your medical conditions, your age, and other factors. Follow these instructions at home: Eating and drinking  Eat a diet that is high in fiber and potassium, and low in sodium, added sugar, and fat. An example of this eating plan is called the DASH diet. DASH stands for Dietary Approaches to Stop Hypertension. To eat this way: Eat  plenty of fresh fruits and vegetables. Try to fill one half of your plate at each meal with fruits and vegetables. Eat whole grains, such as whole-wheat pasta, brown rice, or whole-grain bread. Fill about one  fourth of your plate with whole grains. Eat or drink low-fat dairy products, such as skim milk or low-fat yogurt. Avoid fatty cuts of meat, processed or cured meats, and poultry with skin. Fill about one fourth of your plate with lean proteins, such as fish, chicken without skin, beans, eggs, or tofu. Avoid pre-made and processed foods. These tend to be higher in sodium, added sugar, and fat. Reduce your daily sodium intake. Many people with hypertension should eat less than 1,500 mg of sodium a day. Do not drink alcohol if: Your health care provider tells you not to drink. You are pregnant, may be pregnant, or are planning to become pregnant. If you drink alcohol: Limit how much you have to: 0-1 drink a day for women. 0-2 drinks a day for men. Know how much alcohol is in your drink. In the [place] one drink equals one 12 oz bottle of beer (355 mL), one 5 oz glass of wine (148 mL), or one 1 oz glass of hard liquor (44 mL). Lifestyle  Work with your health care provider to maintain a healthy body weight or to lose weight. Ask what an ideal weight is for you. Get at least 30 minutes of exercise that causes your heart to beat faster (aerobic exercise) most days of the week. Activities may include walking, swimming, or biking. Include exercise to strengthen your muscles (resistance exercise), such as Pilates or lifting weights, as part of your weekly exercise routine. Try to do these types of exercises for 30 minutes at least 3 days a week. Do not use any products that contain nicotine or tobacco. These products include cigarettes, chewing tobacco, and vaping devices, such as e-cigarettes. If you need help quitting, ask your health care provider. Monitor your blood pressure at home as told by your health care provider. Keep all follow-up visits. This is important. Medicines Take over-the-counter and prescription medicines only as told by your health care provider. Follow directions carefully. Blood  pressure medicines must be taken as prescribed. Do not skip doses of blood pressure medicine. Doing this puts you at risk for problems and can make the medicine less effective. Ask your health care provider about side effects or reactions to medicines that you should watch for. Contact a health care provider if you: Think you are having a reaction to a medicine you are taking. Have headaches that keep coming back (recurring). Feel dizzy. Have swelling in your ankles. Have trouble with your vision. Get help right away if you: Develop a severe headache or confusion. Have unusual weakness or numbness. Feel faint. Have severe pain in your chest or abdomen. Vomit repeatedly. Have trouble breathing. These symptoms may be an emergency. Get help right away. Call 911. Do not wait to see if the symptoms will go away. Do not drive yourself to the hospital. Summary Hypertension is when the force of blood pumping through your arteries is too strong. If this condition is not controlled, it may put you at risk for serious complications. Your personal target blood pressure may vary depending on your medical conditions, your age, and other factors. For most people, a normal blood pressure is less than 120/80. Hypertension is treated with lifestyle changes, medicines, or a combination of both. Lifestyle changes include losing weight, eating a healthy,  low-sodium diet, exercising more, and limiting alcohol. This information is not intended to replace advice given to you by your health care provider. Make sure you discuss any questions you have with your health care provider. Document Revised: 05/12/2021 Document Reviewed: 05/12/2021 Elsevier Patient Education  2024 [employer].

## 2023-08-03 NOTE — Telephone Encounter (Signed)
 CPAP/MSLT [employer]: [id number] (exp. 08/01/23 to 10/30/23)

## 2023-08-04 NOTE — Telephone Encounter (Signed)
I also left a voicemail on the patient phone about scheduling his SS appt.

## 2023-08-08 ENCOUNTER — Other Ambulatory Visit: Payer: Self-pay | Admitting: Internal Medicine

## 2023-09-01 ENCOUNTER — Encounter: Payer: Self-pay | Admitting: Occupational Therapy

## 2023-09-01 ENCOUNTER — Other Ambulatory Visit (HOSPITAL_COMMUNITY): Payer: Self-pay

## 2023-09-01 ENCOUNTER — Encounter: Payer: Self-pay | Admitting: Pharmacist

## 2023-09-01 ENCOUNTER — Other Ambulatory Visit: Payer: Self-pay

## 2023-09-01 DIAGNOSIS — R2232 Localized swelling, mass and lump, left upper limb: Secondary | ICD-10-CM | POA: Insufficient documentation

## 2023-09-01 NOTE — Therapy (Signed)
[facility] [address] [place], [place], [address] Phone: [phone]   Fax:  [phone]  Patient Details  Name: Wayne Morales MRN: [id number] Date of Birth: 02-Sep-1970 Referring Provider:  No ref. provider found  OCCUPATIONAL THERAPY DISCHARGE SUMMARY  Visits from Start of Care: 12  Current functional level related to goals / functional outcomes: Pt had met 3/4 short term and 2/4 long term goals to satisfactory levels as of last appt 04/27/23 and cancelled/no-show his last scheduled visits and no further contact was received from patient and this discharge is prepared to close his episode of OT care.  Remaining deficits: Pt had some functional deficits with heavy objects ie) bowling ball and was still having pain 3-4/10 at times.  Education / Equipment: Pt had extensive HEP and various materials and education to assist with self-management. See tx notes for more details.   Patient discharged at this time due expired OT POC and max benefit from skilled occupational therapy.     Victorino Sparrow, OT 09/01/2023, 2:41 PM  [facility] [address] [place], [place], [address] Phone: [phone]   Fax:  [phone]

## 2023-09-06 ENCOUNTER — Other Ambulatory Visit: Payer: Self-pay

## 2023-09-14 ENCOUNTER — Encounter: Payer: Self-pay | Admitting: Nurse Practitioner

## 2023-09-14 ENCOUNTER — Telehealth (INDEPENDENT_AMBULATORY_CARE_PROVIDER_SITE_OTHER): Payer: 59 | Admitting: Nurse Practitioner

## 2023-09-14 ENCOUNTER — Telehealth: Payer: 59

## 2023-09-14 DIAGNOSIS — J069 Acute upper respiratory infection, unspecified: Secondary | ICD-10-CM | POA: Diagnosis not present

## 2023-09-14 MED ORDER — PROMETHAZINE-DM 6.25-15 MG/5ML PO SYRP: 5.0000 mL | ORAL_SOLUTION | Freq: Four times a day (QID) | ORAL | 0 refills | Status: DC | PRN

## 2023-09-14 NOTE — Patient Instructions (Signed)
 It was great to see you!  Drink plenty of fluids and get rest  You can use nasal saline spray for the dryness and congestion in your sinuses  Start cough syrup every 4 hours as needed, this may make you sleepy  I have sent a work note to Pharmacologist.   Let's follow-up if symptoms worsen or don't improve.   Take care,  Rodman Pickle, NP

## 2023-09-14 NOTE — Progress Notes (Signed)
 [facility] [address] [place] [address] Dept: [phone] Dept Fax: [phone]  Virtual Video Visit  I connected with Wayne Morales on 09/14/23 at  9:20 AM EST by a video enabled telemedicine application and verified that I am speaking with the correct person using two identifiers.  Location patient: Home Location provider: Clinic Persons participating in the virtual visit: Patient; Rodman Pickle, NP; Malena Peer, CMA  I discussed the limitations of evaluation and management by telemedicine and the availability of in person appointments. The patient expressed understanding and agreed to proceed.  Chief Complaint  Patient presents with   Acute Visit    Pt C/O of cough, fatigue, congestion, body aches and abdominal pain since Sunday. OTC medication were taken for symptoms     SUBJECTIVE:  HPI: Wayne Morales is a 53 y.o. male who presents with cough, fatigue, and congestion for 4 days.   UPPER RESPIRATORY TRACT INFECTION  Fever: no Cough: yes - productive Shortness of breath: yes - at times with movement Wheezing: no Chest pain: no Chest tightness: no Chest congestion: no Nasal congestion: yes Runny nose: no Post nasal drip: yes Sneezing: no Sore throat: yes - from coughing Swollen glands: no Sinus pressure: no Headache: no Face pain: no Toothache: no Ear pain: no bilateral Ear pressure: no bilateral Eyes red/itching:no Eye drainage/crusting: no  Vomiting: no Rash: no Fatigue: yes Sick contacts: yes - at wedding Saturday, there were people coughing  Strep contacts: no  Context: worse Recurrent sinusitis: no Relief with OTC cold/cough medications: yes  Treatments attempted: theraflu, vitamin C, elderberry  Patient Active Problem List   Diagnosis Date Noted   Pain of left thumb 08/03/2023   Allergic rhinitis 05/08/2023   Narcolepsy due to underlying condition without cataplexy  01/24/2023   Right tennis elbow 01/24/2023   Bunion, right foot 01/24/2023   Elevated coronary artery calcium score 12/15/2021: Total Agatston score is 129, MESA database percentile 96. 09/08/2022   Panic anxiety syndrome 05/19/2022   GAD (generalized anxiety disorder) 05/17/2022   Encounter for general adult medical examination with abnormal findings 04/01/2021   Vitamin D deficiency disease 04/01/2021   Colon cancer screening 04/01/2021   Pure hyperglyceridemia 09/09/2017   Routine general medical examination at a health care facility 09/07/2017   Onychomycosis of great toe 09/07/2017   Protrusion of lumbar intervertebral disc    Essential hypertension 07/08/2014   Hyperlipidemia with target LDL less than 160 07/08/2008    Past Surgical History:  Procedure Laterality Date   INGUINAL HERNIA REPAIR Left    as a child    Family History  Problem Relation Age of Onset   Hypertension Mother    Hyperlipidemia Mother    Supraventricular tachycardia Mother    Diabetes Father    Colon polyps Father 63   Colon cancer Father 20   Hypertension Sister    Brain cancer Brother    Heart disease Brother    Sarcoidosis Brother    Sarcoidosis Brother    Sleep apnea Neg Hx     Social History   Tobacco Use   Smoking status: Never   Smokeless tobacco: Never  Vaping Use   Vaping status: Never Used  Substance Use Topics   Alcohol use: Yes    Comment: socially   Drug use: Never     Current Outpatient Medications:    Ascorbic Acid (VITAMIN C PO), Take 1,000 mg by mouth daily at 6 (six) AM., Disp: , Rfl:  aspirin (ASPIRIN CHILDRENS) 81 MG chewable tablet, Chew 1 tablet (81 mg total) by mouth daily., Disp: 90 tablet, Rfl: 3   Calcium 500-2.5 MG-MCG CHEW, Chew by mouth., Disp: , Rfl:    citalopram (CELEXA) 40 MG tablet, Take 1 tablet (40 mg total) by mouth daily., Disp: 90 tablet, Rfl: 3   diltiazem (CARDIZEM CD) 360 MG 24 hr capsule, Take 1 capsule (360 mg total) by mouth daily.,  Disp: 90 capsule, Rfl: 3   Efinaconazole 10 % SOLN, Apply 1 drop topically daily., Disp: 4 mL, Rfl: 11   ELDERBERRY PO, Take by mouth., Disp: , Rfl:    hydrOXYzine (ATARAX) 25 MG tablet, Take 1 tablet (25 mg total) by mouth 3 (three) times daily as needed., Disp: 60 tablet, Rfl: 2   olmesartan (BENICAR) 40 MG tablet, Take 1 tablet by mouth once daily, Disp: 90 tablet, Rfl: 2   omega-3 acid ethyl esters (LOVAZA) 1 g capsule, Take 2 capsules (2 g total) by mouth 2 (two) times daily., Disp: 360 capsule, Rfl: 3   promethazine-dextromethorphan (PROMETHAZINE-DM) 6.25-15 MG/5ML syrup, Take 5 mLs by mouth 4 (four) times daily as needed for cough., Disp: 118 mL, Rfl: 0   rosuvastatin (CRESTOR) 20 MG tablet, Take 1 tablet (20 mg total) by mouth daily., Disp: 90 tablet, Rfl: 1   pantoprazole (PROTONIX) 40 MG tablet, Take 1 tablet (40 mg total) by mouth daily., Disp: 30 tablet, Rfl: 0  Allergies  Allergen Reactions   Penicillins Hives and Rash    REACTION: Rash    ROS: See pertinent positives and negatives per HPI.  OBSERVATIONS/OBJECTIVE:  VITALS per patient if applicable: There were no vitals filed for this visit. There is no height or weight on file to calculate BMI.    GENERAL: Alert and oriented. Appears well and in no acute distress.  HEENT: Atraumatic. Conjunctiva clear. No obvious abnormalities on inspection of external nose and ears.  NECK: Normal movements of the head and neck.  LUNGS: On inspection, no signs of respiratory distress. Breathing rate appears normal. No obvious gross SOB, gasping or wheezing, and no conversational dyspnea.  CV: No obvious cyanosis.  MS: Moves all visible extremities without noticeable abnormality.  PSYCH/NEURO: Pleasant and cooperative. No obvious depression or anxiety. Speech and thought processing grossly intact.  ASSESSMENT AND PLAN:  Problem List Items Addressed This Visit   None Visit Diagnoses       Upper respiratory tract infection,  unspecified type    -  Primary   Most likely viral, encourage fluids, rest. Cont. OTC meds/vitamins. Can use nasal saline spray for dryness and promethazine q4hr prn cough. Work note given        I discussed the assessment and treatment plan with the patient. The patient was provided an opportunity to ask questions and all were answered. The patient agreed with the plan and demonstrated an understanding of the instructions.   The patient was advised to call back or seek an in-person evaluation if the symptoms worsen or if the condition fails to improve as anticipated.   Gerre Scull, NP

## 2023-09-24 ENCOUNTER — Other Ambulatory Visit: Payer: Self-pay | Admitting: Cardiology

## 2023-09-24 DIAGNOSIS — R931 Abnormal findings on diagnostic imaging of heart and coronary circulation: Secondary | ICD-10-CM

## 2023-09-24 DIAGNOSIS — E781 Pure hyperglyceridemia: Secondary | ICD-10-CM

## 2023-10-09 ENCOUNTER — Ambulatory Visit: Payer: 59 | Admitting: Neurology

## 2023-10-09 DIAGNOSIS — G4761 Periodic limb movement disorder: Secondary | ICD-10-CM

## 2023-10-09 DIAGNOSIS — R400 Somnolence: Secondary | ICD-10-CM

## 2023-10-09 DIAGNOSIS — G4710 Hypersomnia, unspecified: Secondary | ICD-10-CM

## 2023-10-09 DIAGNOSIS — G478 Other sleep disorders: Secondary | ICD-10-CM

## 2023-10-09 DIAGNOSIS — R6889 Other general symptoms and signs: Secondary | ICD-10-CM

## 2023-10-09 DIAGNOSIS — G4719 Other hypersomnia: Secondary | ICD-10-CM

## 2023-10-09 DIAGNOSIS — G4733 Obstructive sleep apnea (adult) (pediatric): Secondary | ICD-10-CM | POA: Diagnosis not present

## 2023-10-10 ENCOUNTER — Other Ambulatory Visit: Payer: Self-pay | Admitting: Neurology

## 2023-10-10 ENCOUNTER — Ambulatory Visit: Payer: 59 | Admitting: Neurology

## 2023-10-10 ENCOUNTER — Other Ambulatory Visit: Payer: Self-pay

## 2023-10-10 DIAGNOSIS — G4710 Hypersomnia, unspecified: Secondary | ICD-10-CM

## 2023-10-10 DIAGNOSIS — G478 Other sleep disorders: Secondary | ICD-10-CM

## 2023-10-10 DIAGNOSIS — R400 Somnolence: Secondary | ICD-10-CM

## 2023-10-10 DIAGNOSIS — Z79899 Other long term (current) drug therapy: Secondary | ICD-10-CM

## 2023-10-10 DIAGNOSIS — G4719 Other hypersomnia: Secondary | ICD-10-CM

## 2023-10-10 DIAGNOSIS — R6889 Other general symptoms and signs: Secondary | ICD-10-CM

## 2023-10-10 DIAGNOSIS — G4733 Obstructive sleep apnea (adult) (pediatric): Secondary | ICD-10-CM

## 2023-10-13 LAB — COMPREHENSIVE DRUG ANALYSIS,UR

## 2023-10-14 ENCOUNTER — Encounter: Payer: Self-pay | Admitting: Neurology

## 2023-10-19 ENCOUNTER — Other Ambulatory Visit (HOSPITAL_COMMUNITY): Payer: Self-pay

## 2023-10-19 ENCOUNTER — Other Ambulatory Visit: Payer: Self-pay

## 2023-10-19 ENCOUNTER — Encounter: Payer: Self-pay | Admitting: Pharmacist

## 2023-10-20 ENCOUNTER — Telehealth: Payer: Self-pay | Admitting: Neurology

## 2023-10-20 NOTE — Procedures (Unsigned)
 Physician Interpretation:    Referred by: Ihor Austin, NP   History and Indication for Testing: 53 year old male with an underlying medical history of PVCs, reflux disease, hyperlipidemia, hypertension, tennis elbow, chronic low back pain, psoriasis, anxiety, OSA, on PAP therapy, and mild obesity, who presents for evaluation of his daytime somnolence despite his treatment of OSA with AutoPap therapy with full compliance.  He was diagnosed with mild obstructive sleep apnea in September 2024 and has been on AutoPap therapy with good apnea control and overall good tolerance and good compliance.  Average pressure is around 12 cm with a range of 6 to 12 cm on his AutoPap settings.  He has residual daytime somnolence.  He presents for a CPAP study with next day MSLT.  Of note, his urine drug screen from 10/10/2023 showed evidence of citalopram and a metabolite of citalopram called desmethylcitalopram, naproxen, dextromethorphan - which is an over-the-counter cough suppressant and its metabolite dextrorphan.    EEG: Review of the EEG showed no abnormal electrical discharges and symmetrical bihemispheric findings.     EKG: The EKG revealed normal sinus rhythm (NSR). ***   AUDIO/VIDEO REVIEW: The audio and video review did not show any abnormal or unusual behaviors, movements, phonations or vocalizations. The patient took no restroom breaks.  No significant snoring was noted.     POST-STUDY QUESTIONNAIRE: Post study, the patient indicated, that sleep was the same as usual.   The patient had a nap study, MSLT, next day on 10/10/23, which showed a mean sleep latency of 11.8 minutes for 5 naps and 0 SOREMPs (sleep onset REM periods).    IMPRESSION:   Obstructive Sleep Apnea (OSA) Dysfunctions associated with sleep stages or arousal from sleep Daytime somnolence PLMs (periodic limb movements of sleep)  RECOMMENDATIONS:   The overnight sleep study with CPAP therapy shows good apnea control at a set  pressure of 12 cm, via Respironics DreamWear fullface mask, which is his home mask.  The patient will be advised to continue with home AutoPap therapy at the current settings or change to a set pressure of 12 cm.  His next day MSLT did not show any significant or pathological daytime somnolence, he had mild nonspecific daytime somnolence, criteria for narcolepsy or idiopathic hypersomnolence were not met.  If the patient has significant daytime somnolence on an ongoing basis without any evidence of an underlying medical or metabolic or hormonal cause, a cautious trial of a wake promoting agent such as Provigil, Nuvigil, or Sunosi can be considered. Of note, his UDS did show evidence of dextromethorphan and its metabolite as well as evidence of citalopram and its metabolite.  Both medications can be potentially sedating.  Citalopram is also a known REM suppressant.  Mild PLMs (periodic limb movements of sleep) were noted during this study with no significant arousals; clinical correlation is recommended. Medication effect from the antidepressant medication should be considered.  The patient should be cautioned not to drive, work at heights, or operate dangerous or heavy equipment when tired or sleepy. Review and reiteration of good sleep hygiene measures should be pursued with any patient. The patient will be seen in follow-up in the sleep clinic at [facility] for discussion of the test results, symptom and treatment compliance review, further management strategies, etc. The referring provider will be notified of the test results.   I certify that I have reviewed the entire raw data recording prior to the issuance of this report in accordance with the Standards of Accreditation of the American  Academy of Sleep Medicine (AASM).  [name], MD, PhD Medical Director, [facility]) Diplomat, ABPN (Neurology and Sleep)   Technical Report:   ***  MSLT interpretation:    ***   MSLT Technical report

## 2023-10-20 NOTE — Telephone Encounter (Signed)
 Please call patient and advise him that his urine drug screen that we did during his daytime sleep study showed evidence of citalopram and its metabolite as well as a cough suppressant called dextromethorphan and its metabolite.  Please inquire if he had tapered off the citalopram and how long he was off of it prior to the MSLT and also inquire if he had taken a cough suppressant?

## 2023-10-21 NOTE — Telephone Encounter (Signed)
 Call to patient, no answer. Left message. Route to pod 4 when calls back.

## 2023-10-24 ENCOUNTER — Other Ambulatory Visit: Payer: Self-pay

## 2023-10-24 NOTE — Telephone Encounter (Signed)
 I called the pt and relayed that the citalopram he was off for 5-6 wks and the prescription cough suppressant he said was off 3 weeks. He was unsure of what cough suppressant and dose of citalopram.  (But noted on medication list was 40mg  citalopram and promethazine DM).

## 2023-10-27 ENCOUNTER — Encounter: Payer: Self-pay | Admitting: Neurology

## 2023-11-17 ENCOUNTER — Other Ambulatory Visit: Payer: Self-pay | Admitting: Cardiology

## 2023-11-17 DIAGNOSIS — E781 Pure hyperglyceridemia: Secondary | ICD-10-CM

## 2023-11-17 DIAGNOSIS — R931 Abnormal findings on diagnostic imaging of heart and coronary circulation: Secondary | ICD-10-CM

## 2023-11-17 MED ORDER — SUNOSI 75 MG PO TABS: 75.0000 mg | ORAL_TABLET | Freq: Every morning | ORAL | 3 refills | Status: DC

## 2023-11-17 NOTE — Telephone Encounter (Signed)
 As discussed, we can start Sunosi , which is FDA approved to treat sleepiness in patients with sleep apnea that are treated for it and still experience sleepiness.   Start Sunosi : 75 mg strength, half a pill each morning for the first 1-2 weeks, then increase to 1 whole pill daily thereafter.   Please advise patient to let us  know in the next 2 weeks as to how it is going.   Please remember that the medication is not indicated to treat the underlying obstructive sleep apnea, he still has to continue to be compliant with CPAP.   Common side effects of Sunosi  include insomnia, nausea, headache, loss of appetite, anxietyand irritability. Side effects may also include increase in blood pressure and heart rate.

## 2023-11-18 ENCOUNTER — Other Ambulatory Visit (HOSPITAL_COMMUNITY): Payer: Self-pay

## 2023-11-18 ENCOUNTER — Telehealth: Payer: Self-pay

## 2023-11-18 DIAGNOSIS — G4710 Hypersomnia, unspecified: Secondary | ICD-10-CM

## 2023-11-18 DIAGNOSIS — G4733 Obstructive sleep apnea (adult) (pediatric): Secondary | ICD-10-CM

## 2023-11-18 NOTE — Telephone Encounter (Signed)
 Pharmacy Patient Advocate Encounter   Received notification from CoverMyMeds that prior authorization for Sunosi  75MG  tablets is required/requested.   Insurance verification completed.   The patient is insured through [facility] .   Per test claim: PA required; PA submitted to above mentioned insurance via CoverMyMeds Key/confirmation #/EOC [id number] Status is pending

## 2023-11-25 NOTE — Telephone Encounter (Signed)
 Pharmacy Patient Advocate Encounter  Received notification from [employer] that Prior Authorization for Sunosi  75MG  tablets has been DENIED.  Full denial letter will be uploaded to the media tab. See denial reason below.   PA #/Case ID/Reference #: [id number]

## 2023-11-29 ENCOUNTER — Telehealth: Payer: Self-pay

## 2023-11-29 ENCOUNTER — Other Ambulatory Visit (HOSPITAL_COMMUNITY): Payer: Self-pay

## 2023-11-29 MED ORDER — MODAFINIL 100 MG PO TABS
100.0000 mg | ORAL_TABLET | Freq: Two times a day (BID) | ORAL | 3 refills | Status: AC | PRN
Start: 2023-11-29 — End: ?

## 2023-11-29 NOTE — Telephone Encounter (Signed)
 Please advise patient that insurance denied prior authorization for Sunosi .  I recommend he avoid sedating medications such as hydroxyzine  which is on his active list of medications. We can start generic Provigil 100 mg up to twice daily.  He can take it first thing in the morning and lunch if needed, avoid after 3 PM due to possibility of insomnia. Side effects include (but are not limited to): high blood pressure, headache, nervousness, palpitations, GI upset, tremor.

## 2023-11-29 NOTE — Telephone Encounter (Signed)
 Pharmacy Patient Advocate Encounter   Received notification from Physician's Office that prior authorization for Modafinil 100mg  Tablet is required/requested.   Insurance verification completed.   The patient is insured through [facility] .   Per test claim: The current 30 day co-pay is, $35.00.  No PA needed at this time. This test claim was processed through [facility]- copay amounts may vary at other pharmacies due to pharmacy/plan contracts, or as the patient moves through the different stages of their insurance plan.

## 2024-01-16 ENCOUNTER — Other Ambulatory Visit: Payer: Self-pay | Admitting: Internal Medicine

## 2024-01-16 ENCOUNTER — Other Ambulatory Visit: Payer: Self-pay

## 2024-01-20 ENCOUNTER — Other Ambulatory Visit: Payer: Self-pay | Admitting: Internal Medicine

## 2024-01-20 DIAGNOSIS — E785 Hyperlipidemia, unspecified: Secondary | ICD-10-CM

## 2024-02-05 ENCOUNTER — Other Ambulatory Visit: Payer: Self-pay | Admitting: Cardiology

## 2024-02-05 DIAGNOSIS — I10 Essential (primary) hypertension: Secondary | ICD-10-CM

## 2024-02-05 DIAGNOSIS — R931 Abnormal findings on diagnostic imaging of heart and coronary circulation: Secondary | ICD-10-CM

## 2024-02-05 DIAGNOSIS — E781 Pure hyperglyceridemia: Secondary | ICD-10-CM

## 2024-02-09 ENCOUNTER — Other Ambulatory Visit: Payer: Self-pay | Admitting: Internal Medicine

## 2024-02-15 ENCOUNTER — Encounter: Payer: Self-pay | Admitting: Adult Health

## 2024-02-15 ENCOUNTER — Ambulatory Visit: Payer: 59 | Admitting: Adult Health

## 2024-02-15 VITALS — BP 127/81 | HR 63 | Ht 67.0 in | Wt 212.0 lb

## 2024-02-15 DIAGNOSIS — G2581 Restless legs syndrome: Secondary | ICD-10-CM

## 2024-02-15 DIAGNOSIS — G4710 Hypersomnia, unspecified: Secondary | ICD-10-CM | POA: Diagnosis not present

## 2024-02-15 DIAGNOSIS — G4730 Sleep apnea, unspecified: Secondary | ICD-10-CM | POA: Diagnosis not present

## 2024-02-15 DIAGNOSIS — G4733 Obstructive sleep apnea (adult) (pediatric): Secondary | ICD-10-CM | POA: Diagnosis not present

## 2024-02-15 NOTE — Patient Instructions (Addendum)
 Your Plan:  Restart CPAP therapy, ensure nightly use with greater than 4 hours per night  Continue modafinil  as needed   We will check lab work today for other causes of restless leg symptoms     Follow up in 1 year or call earlier if needed     Thank you for coming to see us  at [facility]. I hope we have been able to provide you high quality care today.  You may receive a patient satisfaction survey over the next few weeks. We would appreciate your feedback and comments so that we may continue to improve ourselves and the health of our patients.  Restless Legs Syndrome Restless legs syndrome is a condition that causes uncomfortable feelings or sensations in the legs, especially while sitting or lying down. The sensations usually cause an overwhelming urge to move the legs. The arms can also sometimes be affected. The condition can range from mild to severe. The symptoms often interfere with a person's ability to sleep. What are the causes? The cause of this condition is not known. What increases the risk? The following factors may make you more likely to develop this condition: Being older than 50. Pregnancy. Being a woman. In general, the condition is more common in women than in men. A family history of the condition. Having iron deficiency. Overuse of caffeine, nicotine, or alcohol. Certain medical conditions, such as kidney disease, Parkinson's disease, or nerve damage. Certain medicines, such as those for high blood pressure, nausea, colds, allergies, depression, and some heart conditions. What are the signs or symptoms? The main symptom of this condition is uncomfortable sensations in the legs, such as: Pulling. Tingling. Prickling. Throbbing. Crawling. Burning. Usually, the sensations: Affect both sides of the body. Are worse when you sit or lie down. Are worse at night. These may make it difficult to fall asleep. Make you have a strong urge  to move your legs. Are temporarily relieved by moving your legs or standing. The arms can also be affected, but this is rare. People who have this condition often have tiredness during the day because of their lack of sleep at night. How is this diagnosed? This condition may be diagnosed based on: Your symptoms. Blood tests. In some cases, you may be monitored in a sleep lab by a specialist (a sleep study). This can detect any disruptions in your sleep. How is this treated? This condition is treated by managing the symptoms. This may include: Lifestyle changes, such as exercising, using relaxation techniques, and avoiding caffeine, alcohol, or tobacco. Iron supplements. Medicines. Parkinson's medications may be tried first. Anti-seizure medications can also be helpful. Follow these instructions at home: General instructions Take over-the-counter and prescription medicines only as told by your health care provider. Use methods to help relieve the uncomfortable sensations, such as: Massaging your legs. Walking or stretching. Taking a cold or hot bath. Keep all follow-up visits. This is important. Lifestyle     Practice good sleep habits. For example, go to bed and get up at the same time every day. Most adults should get 7-9 hours of sleep each night. Exercise regularly. Try to get at least 30 minutes of exercise most days of the week. Practice ways of relaxing, such as yoga or meditation. Avoid caffeine and alcohol. Do not use any products that contain nicotine or tobacco. These products include cigarettes, chewing tobacco, and vaping devices, such as e-cigarettes. If you need help quitting, ask your health care provider. Where to find more  information [employer] of Neurological Disorders and Stroke: [employer] Contact a health care provider if: Your symptoms get worse or they do not improve with treatment. Summary Restless legs syndrome is a condition that causes  uncomfortable feelings or sensations in the legs, especially while sitting or lying down. The symptoms often interfere with your ability to sleep. This condition is treated by managing the symptoms. You may need to make lifestyle changes or take medicines. This information is not intended to replace advice given to you by your health care provider. Make sure you discuss any questions you have with your health care provider. Document Revised: 02/15/2021 Document Reviewed: 02/15/2021 Elsevier Patient Education  2024 [employer].

## 2024-02-15 NOTE — Progress Notes (Signed)
 [facility] [address] [place]. [place] [address] [phone]       OFFICE FOLLOW UP NOTE  [name] Date of Birth:  12/21/1970 Medical Record Number:  [id number]    Primary neurologist: Dr. Buck Reason for visit: CPAP follow-up    SUBJECTIVE:   CHIEF COMPLAINT:  Chief Complaint  Patient presents with   Follow-up    Pt alone  rm  here for follow up. He isnt using CPAP because he didn't think he needed to continue.he stopped using it after the sleep study completed in March. He states that he thought he needed that just because of needing to be on it to complete sleep study. He doesn't recall having issues when he wore it. Is not opposed to restarting it if he truly needed it. DME [facility]. His wife advised that he has been having some restlessness in legs during sleep. Will discuss if restarting pap may could help   Other    Pt does have modafinil  he uses on days in he is in the office which has helped    Follow-up visit:  Prior visit: 07/18/2023  Brief HPI:   Wayne Morales is a 53 y.o. male who was evaluated by Dr. Buck on 03/14/2023 for concern of underlying sleep apnea with complaints of snoring and excessive daytime somnolence as well as intermittent sleep paralysis, vivid dreams and RLS.  ESS 17/24, FSS 38/63.  Concern for possible OSA and/or possible hypersomnolence disorder such as narcolepsy vs idiopathic hypersomnolence.  Recommended pursuing with nocturnal polysomnogram and next-day MLST.  Discussed gradually tapering off Celexa  in order to pursue further testing, discussed possibility of Celexa  contributing to RLS symptoms.  Insurance declined nocturnal polysomnogram and MSLT therefore proceeded with HST completed 04/05/2023 which showed overall mild OSA with total AHI 13.4/h and O2 nadir of 81%.  Recommend initiation of AutoPap.  AutoPap set up 05/16/2023. DME [facility].   At prior visit, noted excellent CPAP compliance and optimal  residual AHI with improvement of RLS/PLMD symptoms but still c/o persistent excessive daytime sleepiness.  Pursued MSLT 09/2023 which did not show any significant or pathological daytime somnolence.  UDS showed evidence of dextromethorphan and its metabolite as well as evidence of citalopram  and its metabolite although patient reported stopping citalopram  5 to 6 weeks prior to study and reports prior use of cough suppressant 3 weeks prior.  He was advised to continue with CPAP therapy.  Attempted to start on Sunosi  but insurance denied therefore proceeded with modafinil .     Interval history:  Returns for follow up visit. Has not been using CPAP since titration and MSLT testing as he did not think he needed to continue CPAP use. He still has machine and willing to restart. Reports increased of RLS symptoms, can occur at night as observed by wife but also during the daytime especially after sitting for more than 30 minutes such as at church, legs will start to ache and he will have to move his legs.  He does report benefit with modafinil , usually only takes when he is required to be in the office as he normally feels good if he is up and moving.  ESS 13/24.          ROS:   14 system review of systems performed and negative with exception of those listed in HPI  PMH:  Past Medical History:  Diagnosis Date   Atrial fibrillation (HCC)    pt states they figured it was not a-fib but does  have trouble with heart valve   Chronic back pain    Hyperlipidemia    diet controlled   Hypertension    on meds   Scalp psoriasis 12/15/2012    PSH:  Past Surgical History:  Procedure Laterality Date   INGUINAL HERNIA REPAIR Left    as a child    Social History:  Social History   Socioeconomic History   Marital status: Married    Spouse name: stephanie   Number of children: 1   Years of education: Not on file   Highest education level: Not on file  Occupational History   Not on file   Tobacco Use   Smoking status: Never   Smokeless tobacco: Never  Vaping Use   Vaping status: Never Used  Substance and Sexual Activity   Alcohol use: Yes    Comment: socially   Drug use: Never   Sexual activity: Yes  Other Topics Concern   Not on file  Social History Narrative   Child passed due to brain and spinal cancer.      Right handed   Caffeine: occasional    Social Drivers of Corporate investment banker Strain: Not on file  Food Insecurity: Not on file  Transportation Needs: Not on file  Physical Activity: Not on file  Stress: Not on file  Social Connections: Not on file  Intimate Partner Violence: Not on file    Family History:  Family History  Problem Relation Age of Onset   Hypertension Mother    Hyperlipidemia Mother    Supraventricular tachycardia Mother    Diabetes Father    Colon polyps Father 55   Colon cancer Father 70   Hypertension Sister    Brain cancer Brother    Heart disease Brother    Sarcoidosis Brother    Sarcoidosis Brother    Sleep apnea Neg Hx     Medications:   Current Outpatient Medications on File Prior to Visit  Medication Sig Dispense Refill   Ascorbic Acid (VITAMIN C PO) Take 1,000 mg by mouth daily at 6 (six) AM.     aspirin  (ASPIRIN  CHILDRENS) 81 MG chewable tablet Chew 1 tablet (81 mg total) by mouth daily. 90 tablet 3   Calcium  500-2.5 MG-MCG CHEW Chew by mouth.     citalopram  (CELEXA ) 40 MG tablet Take 1 tablet (40 mg total) by mouth daily. NEEDS APPOINTMENT FOR REFILLS 30 tablet 0   diltiazem  (CARDIZEM  CD) 360 MG 24 hr capsule Take 1 capsule (360 mg total) by mouth daily. 90 capsule 3   ELDERBERRY PO Take by mouth.     modafinil  (PROVIGIL ) 100 MG tablet Take 1 tablet (100 mg total) by mouth 2 (two) times daily as needed. 60 tablet 3   olmesartan  (BENICAR ) 40 MG tablet Take 1 tablet by mouth once daily 30 tablet 0   omega-3 acid ethyl esters (LOVAZA ) 1 g capsule Take 2 capsules (2 g total) by mouth 2 (two) times daily.  120 capsule 0   rosuvastatin  (CRESTOR ) 20 MG tablet Take 1 tablet (20 mg total) by mouth daily. Schedule an appt for further refills 30 tablet 0   No current facility-administered medications on file prior to visit.    Allergies:   Allergies  Allergen Reactions   Penicillins Hives and Rash    REACTION: Rash      OBJECTIVE:  Physical Exam  Vitals:   02/15/24 1114  BP: 127/81  Pulse: 63  Weight: 212 lb (96.2 kg)  Height:  5' 7 (1.702 m)    Body mass index is 33.2 kg/m. No results found.  General: well developed, well nourished, seated, in no evident distress  Neurologic Exam Mental Status: Awake and fully alert. Oriented to place and time. Recent and remote memory intact. Attention span, concentration and fund of knowledge appropriate. Mood and affect appropriate.  Cranial Nerves: Pupils equal, briskly reactive to light. Extraocular movements full without nystagmus. Visual fields full to confrontation. Hearing intact. Facial sensation intact. Face, tongue, palate moves normally and symmetrically.  Motor: Normal bulk and tone. Normal strength in all tested extremity muscles Gait and Station: Arises from chair without difficulty. Stance is normal. Gait demonstrates normal stride length and balance without use of AD.         ASSESSMENT/PLAN: Wayne Morales is a 53 y.o. year old male    OSA on CPAP :  Has not used CPAP consistently since March as he was not aware this needed to be continued after MSLT Advised to restart CPAP at current pressure setting of 6-12 with EPR 1 Discussed importance of nightly usage with ensuring greater than 4 hours nightly for optimal benefit and per insurance purposes.   Continue to follow with DME company for any needed supplies or CPAP related concerns  Hypersomnolence:  Continue modafinil  100 mg twice daily as needed MSLT criteria for narcolepsy or idiopathic hypersomnolence not met   RLS: Suspect symptoms will improve after  restarting CPAP therapy Due to daytime symptoms, we will also check lab work for reversible causes     Follow up in 1 year or call earlier if needed   CC:  PCP: Joshua Debby CROME, MD    I personally spent a total of 30 minutes in the care of the patient today including preparing to see the patient, performing a medically appropriate exam/evaluation, counseling and educating, placing orders, and documenting clinical information in the EHR.   Harlene Bogaert, AGNP-BC  [facility] [address] [place], [place] [address]  Phone [phone] Fax [phone] Note: This document was prepared with digital dictation and possible smart phrase technology. Any transcriptional errors that result from this process are unintentional.

## 2024-02-16 LAB — MAGNESIUM: Magnesium: 2 mg/dL (ref 1.6–2.3)

## 2024-02-16 LAB — IRON,TIBC AND FERRITIN PANEL
Ferritin: 509 ng/mL — ABNORMAL HIGH (ref 30–400)
Iron Saturation: 32 % (ref 15–55)
Iron: 91 ug/dL (ref 38–169)
Total Iron Binding Capacity: 285 ug/dL (ref 250–450)
UIBC: 194 ug/dL (ref 111–343)

## 2024-02-16 NOTE — Progress Notes (Signed)
 Nellene Courtois D, CMA  Zott, Gasper Ona, Tammy; Darrel Boyer New orders have been placed for the above pt, DOB:03/25/1971 Thanks

## 2024-02-17 ENCOUNTER — Ambulatory Visit: Payer: Self-pay | Admitting: Adult Health

## 2024-02-20 ENCOUNTER — Other Ambulatory Visit: Payer: Self-pay | Admitting: Internal Medicine

## 2024-02-20 DIAGNOSIS — E785 Hyperlipidemia, unspecified: Secondary | ICD-10-CM

## 2024-03-01 ENCOUNTER — Ambulatory Visit: Admitting: Internal Medicine

## 2024-03-01 ENCOUNTER — Encounter: Payer: Self-pay | Admitting: Internal Medicine

## 2024-03-01 ENCOUNTER — Ambulatory Visit: Payer: Self-pay | Admitting: Internal Medicine

## 2024-03-01 VITALS — BP 138/92 | HR 66 | Temp 98.2°F | Resp 16 | Ht 67.0 in | Wt 210.8 lb

## 2024-03-01 DIAGNOSIS — E785 Hyperlipidemia, unspecified: Secondary | ICD-10-CM

## 2024-03-01 DIAGNOSIS — I493 Ventricular premature depolarization: Secondary | ICD-10-CM | POA: Diagnosis not present

## 2024-03-01 DIAGNOSIS — D696 Thrombocytopenia, unspecified: Secondary | ICD-10-CM

## 2024-03-01 DIAGNOSIS — I10 Essential (primary) hypertension: Secondary | ICD-10-CM

## 2024-03-01 DIAGNOSIS — Z0001 Encounter for general adult medical examination with abnormal findings: Secondary | ICD-10-CM

## 2024-03-01 DIAGNOSIS — E781 Pure hyperglyceridemia: Secondary | ICD-10-CM | POA: Diagnosis not present

## 2024-03-01 LAB — HEPATIC FUNCTION PANEL
ALT: 34 U/L (ref 0–53)
AST: 24 U/L (ref 0–37)
Albumin: 4.8 g/dL (ref 3.5–5.2)
Alkaline Phosphatase: 72 U/L (ref 39–117)
Bilirubin, Direct: 0.1 mg/dL (ref 0.0–0.3)
Total Bilirubin: 0.7 mg/dL (ref 0.2–1.2)
Total Protein: 7.5 g/dL (ref 6.0–8.3)

## 2024-03-01 LAB — CBC WITH DIFFERENTIAL/PLATELET
Basophils Absolute: 0 K/uL (ref 0.0–0.1)
Basophils Relative: 0.3 % (ref 0.0–3.0)
Eosinophils Absolute: 0.2 K/uL (ref 0.0–0.7)
Eosinophils Relative: 3 % (ref 0.0–5.0)
HCT: 43.8 % (ref 39.0–52.0)
Hemoglobin: 14.9 g/dL (ref 13.0–17.0)
Lymphocytes Relative: 31.9 % (ref 12.0–46.0)
Lymphs Abs: 2.3 K/uL (ref 0.7–4.0)
MCHC: 34 g/dL (ref 30.0–36.0)
MCV: 95.8 fl (ref 78.0–100.0)
Monocytes Absolute: 0.6 K/uL (ref 0.1–1.0)
Monocytes Relative: 8.4 % (ref 3.0–12.0)
Neutro Abs: 4.1 K/uL (ref 1.4–7.7)
Neutrophils Relative %: 56.4 % (ref 43.0–77.0)
Platelets: 146 K/uL — ABNORMAL LOW (ref 150.0–400.0)
RBC: 4.58 Mil/uL (ref 4.22–5.81)
RDW: 13.1 % (ref 11.5–15.5)
WBC: 7.3 K/uL (ref 4.0–10.5)

## 2024-03-01 LAB — URINALYSIS, ROUTINE W REFLEX MICROSCOPIC
Bilirubin Urine: NEGATIVE
Hgb urine dipstick: NEGATIVE
Ketones, ur: NEGATIVE
Leukocytes,Ua: NEGATIVE
Nitrite: NEGATIVE
RBC / HPF: NONE SEEN (ref 0–?)
Specific Gravity, Urine: 1.02 (ref 1.000–1.030)
Total Protein, Urine: NEGATIVE
Urine Glucose: NEGATIVE
Urobilinogen, UA: 1 (ref 0.0–1.0)
pH: 6 (ref 5.0–8.0)

## 2024-03-01 LAB — BASIC METABOLIC PANEL WITH GFR
BUN: 11 mg/dL (ref 6–23)
CO2: 28 meq/L (ref 19–32)
Calcium: 9.7 mg/dL (ref 8.4–10.5)
Chloride: 103 meq/L (ref 96–112)
Creatinine, Ser: 1.17 mg/dL (ref 0.40–1.50)
GFR: 71.5 mL/min (ref >60.00)
Glucose, Bld: 82 mg/dL (ref 70–99)
Potassium: 4 meq/L (ref 3.5–5.1)
Sodium: 139 meq/L (ref 135–145)

## 2024-03-01 LAB — TSH: TSH: 0.45 u[IU]/mL (ref 0.35–5.50)

## 2024-03-01 LAB — LIPID PANEL
Cholesterol: 142 mg/dL (ref 0–200)
HDL: 31.3 mg/dL — ABNORMAL LOW (ref >39.00)
LDL Cholesterol: 67 mg/dL (ref 0–99)
NonHDL: 110.65
Total CHOL/HDL Ratio: 5
Triglycerides: 218 mg/dL — ABNORMAL HIGH (ref 0.0–149.0)
VLDL: 43.6 mg/dL — ABNORMAL HIGH (ref 0.0–40.0)

## 2024-03-01 LAB — PSA: PSA: 0.6 ng/mL (ref 0.10–4.00)

## 2024-03-01 MED ORDER — DILTIAZEM HCL ER COATED BEADS 360 MG PO CP24: 360.0000 mg | ORAL_CAPSULE | Freq: Every day | ORAL | 1 refills | Status: DC

## 2024-03-01 MED ORDER — ROSUVASTATIN CALCIUM 20 MG PO TABS: 20.0000 mg | ORAL_TABLET | Freq: Every day | ORAL | 1 refills | Status: DC

## 2024-03-01 NOTE — Progress Notes (Signed)
 "  Subjective:  Patient ID: Wayne Morales, male    DOB: 14-May-1971  Age: 53 y.o. MRN: [id number]  CC: Annual Exam, Hypertension, and Hyperlipidemia   HPI Wayne Morales presents for a CPX and f/up ----  Discussed the use of AI scribe software for clinical note transcription with the patient, who gave verbal consent to proceed.  History of Present Illness Wayne Morales is a 53 year old male who presents with fatigue and palpitations.  He has been experiencing fatigue for about a year and a half, with quick tiring during physical activity and an inability to sustain activity as long as before. Despite this, he remains active and considers this level of fatigue as his 'new norm'. He uses Provigil  as needed to stay alert, particularly when he needs to be in the office, as he tends to fall asleep when sitting still for long periods. No chest pain or shortness of breath during activity.  He experiences occasional palpitations, described as a pressure build-up in his chest that leads to a sensation of tightness and then release, feeling like he misses a beat during these episodes. He was previously evaluated by a cardiologist, who told him he has PVCs (premature ventricular contractions).  He reports a slight weight gain as a side effect of his medications but denies muscle aches from his statin or dizziness from his calcium  channel blocker. No recent dizziness or lightheadedness, although he experienced dizziness last year when laughing, which he attributes to anxiety and has since been controlled, possibly by medication.    Outpatient Medications Prior to Visit  Medication Sig Dispense Refill   Ascorbic Acid (VITAMIN C PO) Take 1,000 mg by mouth daily at 6 (six) AM.     Calcium  500-2.5 MG-MCG CHEW Chew by mouth.     citalopram  (CELEXA ) 40 MG tablet Take 1 tablet (40 mg total) by mouth daily. NEEDS APPOINTMENT FOR REFILLS 30 tablet 0   ELDERBERRY PO Take by mouth.     modafinil  (PROVIGIL )  100 MG tablet Take 1 tablet (100 mg total) by mouth 2 (two) times daily as needed. 60 tablet 3   olmesartan  (BENICAR ) 40 MG tablet Take 1 tablet by mouth once daily 30 tablet 0   diltiazem  (CARDIZEM  CD) 360 MG 24 hr capsule Take 1 capsule (360 mg total) by mouth daily. 90 capsule 3   rosuvastatin  (CRESTOR ) 20 MG tablet Take 1 tablet (20 mg total) by mouth daily. Schedule an appt for further refills 30 tablet 0   aspirin  (ASPIRIN  CHILDRENS) 81 MG chewable tablet Chew 1 tablet (81 mg total) by mouth daily. (Patient not taking: Reported on 03/01/2024) 90 tablet 3   omega-3 acid ethyl esters (LOVAZA ) 1 g capsule Take 2 capsules (2 g total) by mouth 2 (two) times daily. 120 capsule 0   No facility-administered medications prior to visit.    ROS Review of Systems  Constitutional:  Positive for fatigue. Negative for appetite change, chills, diaphoresis and fever.  HENT: Negative.    Eyes: Negative.   Respiratory: Negative.  Negative for cough, chest tightness, shortness of breath and wheezing.   Cardiovascular:  Positive for palpitations. Negative for chest pain and leg swelling.  Gastrointestinal: Negative.  Negative for abdominal pain, constipation, diarrhea, nausea and vomiting.  Endocrine: Negative.   Genitourinary: Negative.  Negative for difficulty urinating, dysuria, hematuria, penile swelling, scrotal swelling and testicular pain.  Musculoskeletal: Negative.  Negative for arthralgias and myalgias.  Skin: Negative.   Neurological:  Negative for dizziness, seizures, weakness,  light-headedness and numbness.  Hematological:  Negative for adenopathy. Does not bruise/bleed easily.  Psychiatric/Behavioral: Negative.      Objective:  BP (!) 138/92 (BP Location: Left Arm, Patient Position: Sitting, Cuff Size: Large)   Pulse 66   Temp 98.2 F (36.8 C) (Oral)   Resp 16   Ht 5' 7 (1.702 m)   Wt 210 lb 12.8 oz (95.6 kg)   SpO2 97%   BMI 33.02 kg/m   BP Readings from Last 3 Encounters:   03/01/24 (!) 138/92  02/15/24 127/81  08/03/23 138/78    Wt Readings from Last 3 Encounters:  03/01/24 210 lb 12.8 oz (95.6 kg)  02/15/24 212 lb (96.2 kg)  08/03/23 217 lb 9.6 oz (98.7 kg)    Physical Exam Vitals reviewed.  Constitutional:      Appearance: Normal appearance.  HENT:     Nose: Nose normal.     Mouth/Throat:     Mouth: Mucous membranes are moist.  Eyes:     General: No scleral icterus.    Conjunctiva/sclera: Conjunctivae normal.  Cardiovascular:     Rate and Rhythm: Normal rate and regular rhythm.     Heart sounds: No murmur heard.    No friction rub. No gallop.  Pulmonary:     Effort: Pulmonary effort is normal.     Breath sounds: No stridor. No wheezing, rhonchi or rales.  Abdominal:     General: Abdomen is flat.     Palpations: There is no mass.     Tenderness: There is no abdominal tenderness. There is no guarding.     Hernia: No hernia is present. There is no hernia in the left inguinal area or right inguinal area.  Genitourinary:    Pubic Area: No rash.      Penis: Normal and circumcised.      Testes: Normal.     Epididymis:     Right: Normal.     Left: Normal.     Prostate: Normal. Not enlarged, not tender and no nodules present.     Rectum: Normal. Guaiac result negative. No mass, tenderness, anal fissure, external hemorrhoid or internal hemorrhoid. Normal anal tone.  Musculoskeletal:        General: Normal range of motion.     Right lower leg: No edema.     Left lower leg: No edema.  Lymphadenopathy:     Cervical: No cervical adenopathy.     Lower Body: No right inguinal adenopathy. No left inguinal adenopathy.  Skin:    General: Skin is warm and dry.  Neurological:     General: No focal deficit present.     Mental Status: He is alert. Mental status is at baseline.  Psychiatric:        Mood and Affect: Mood normal.        Behavior: Behavior normal.     Lab Results  Component Value Date   WBC 7.3 03/01/2024   HGB 14.9 03/01/2024    HCT 43.8 03/01/2024   PLT 146.0 (L) 03/01/2024   GLUCOSE 82 03/01/2024   CHOL 142 03/01/2024   TRIG 218.0 (H) 03/01/2024   HDL 31.30 (L) 03/01/2024   LDLDIRECT 55.0 01/24/2023   LDLCALC 67 03/01/2024   ALT 34 03/01/2024   AST 24 03/01/2024   NA 139 03/01/2024   K 4.0 03/01/2024   CL 103 03/01/2024   CREATININE 1.17 03/01/2024   BUN 11 03/01/2024   CO2 28 03/01/2024   TSH 0.45 03/01/2024   PSA 0.60  03/01/2024   INR 1.0 05/04/2023   HGBA1C 5.2 01/24/2023    DG Chest 2 View Result Date: 05/04/2023 CLINICAL DATA:  Chest pain, nausea and shortness of breath. EXAM: CHEST - 2 VIEW COMPARISON:  PA and lateral 08/19/2022 FINDINGS: The heart size and mediastinal contours are within normal limits. Both lungs are clear. The visualized skeletal structures are unremarkable. IMPRESSION: No active cardiopulmonary disease.  Stable chest. Electronically Signed   By: Francis Quam M.D.   On: 05/04/2023 23:11    Assessment & Plan:   Essential hypertension- He has not achieved his BP goal. Will restart the CCB. -     TSH; Future -     Urinalysis, Routine w reflex microscopic; Future -     Hepatic function panel; Future -     Basic metabolic panel with GFR; Future -     CBC with Differential/Platelet; Future -     dilTIAZem  HCl ER Coated Beads; Take 1 capsule (360 mg total) by mouth daily.  Dispense: 90 capsule; Refill: 1  Pure hyperglyceridemia -     Lipid panel; Future -     Hepatic function panel; Future  Encounter for general adult medical examination with abnormal findings- Exam completed, labs reviewed, vaccines reviewed, cancer screenings addressed, pt ed material was given.  -     PSA; Future  Hyperlipidemia with target LDL less than 160- LDL goal achieved. Doing well on the statin  -     Lipid panel; Future -     TSH; Future -     Hepatic function panel; Future -     Rosuvastatin  Calcium ; Take 1 tablet (20 mg total) by mouth daily.  Dispense: 90 tablet; Refill: 1  PVC  (premature ventricular contraction) -     dilTIAZem  HCl ER Coated Beads; Take 1 capsule (360 mg total) by mouth daily.  Dispense: 90 capsule; Refill: 1  Thrombocytopenia (HCC)- No B/B. -     Vitamin B12; Future -     Folate; Future -     Methylmalonic acid, serum; Future     Follow-up: Return in about 6 months (around 09/01/2024).  Debby Molt, MD "

## 2024-03-01 NOTE — Patient Instructions (Signed)
 Health Maintenance, Male  Adopting a healthy lifestyle and getting preventive care are important in promoting health and wellness. Ask your health care provider about:  The right schedule for you to have regular tests and exams.  Things you can do on your own to prevent diseases and keep yourself healthy.  What should I know about diet, weight, and exercise?  Eat a healthy diet    Eat a diet that includes plenty of vegetables, fruits, low-fat dairy products, and lean protein.  Do not eat a lot of foods that are high in solid fats, added sugars, or sodium.  Maintain a healthy weight  Body mass index (BMI) is a measurement that can be used to identify possible weight problems. It estimates body fat based on height and weight. Your health care provider can help determine your BMI and help you achieve or maintain a healthy weight.  Get regular exercise  Get regular exercise. This is one of the most important things you can do for your health. Most adults should:  Exercise for at least 150 minutes each week. The exercise should increase your heart rate and make you sweat (moderate-intensity exercise).  Do strengthening exercises at least twice a week. This is in addition to the moderate-intensity exercise.  Spend less time sitting. Even light physical activity can be beneficial.  Watch cholesterol and blood lipids  Have your blood tested for lipids and cholesterol at 53 years of age, then have this test every 5 years.  You may need to have your cholesterol levels checked more often if:  Your lipid or cholesterol levels are high.  You are older than 53 years of age.  You are at high risk for heart disease.  What should I know about cancer screening?  Many types of cancers can be detected early and may often be prevented. Depending on your health history and family history, you may need to have cancer screening at various ages. This may include screening for:  Colorectal cancer.  Prostate cancer.  Skin cancer.  Lung  cancer.  What should I know about heart disease, diabetes, and high blood pressure?  Blood pressure and heart disease  High blood pressure causes heart disease and increases the risk of stroke. This is more likely to develop in people who have high blood pressure readings or are overweight.  Talk with your health care provider about your target blood pressure readings.  Have your blood pressure checked:  Every 3-5 years if you are 24-52 years of age.  Every year if you are 3 years old or older.  If you are between the ages of 60 and 72 and are a current or former smoker, ask your health care provider if you should have a one-time screening for abdominal aortic aneurysm (AAA).  Diabetes  Have regular diabetes screenings. This checks your fasting blood sugar level. Have the screening done:  Once every three years after age 66 if you are at a normal weight and have a low risk for diabetes.  More often and at a younger age if you are overweight or have a high risk for diabetes.  What should I know about preventing infection?  Hepatitis B  If you have a higher risk for hepatitis B, you should be screened for this virus. Talk with your health care provider to find out if you are at risk for hepatitis B infection.  Hepatitis C  Blood testing is recommended for:  Everyone born from [birth year] through 1965.  Anyone  with known risk factors for hepatitis C.  Sexually transmitted infections (STIs)  You should be screened each year for STIs, including gonorrhea and chlamydia, if:  You are sexually active and are younger than 53 years of age.  You are older than 53 years of age and your health care provider tells you that you are at risk for this type of infection.  Your sexual activity has changed since you were last screened, and you are at increased risk for chlamydia or gonorrhea. Ask your health care provider if you are at risk.  Ask your health care provider about whether you are at high risk for HIV. Your health care provider  may recommend a prescription medicine to help prevent HIV infection. If you choose to take medicine to prevent HIV, you should first get tested for HIV. You should then be tested every 3 months for as long as you are taking the medicine.  Follow these instructions at home:  Alcohol use  Do not drink alcohol if your health care provider tells you not to drink.  If you drink alcohol:  Limit how much you have to 0-2 drinks a day.  Know how much alcohol is in your drink. In the [place] one drink equals one 12 oz bottle of beer (355 mL), one 5 oz glass of wine (148 mL), or one 1 oz glass of hard liquor (44 mL).  Lifestyle  Do not use any products that contain nicotine or tobacco. These products include cigarettes, chewing tobacco, and vaping devices, such as e-cigarettes. If you need help quitting, ask your health care provider.  Do not use street drugs.  Do not share needles.  Ask your health care provider for help if you need support or information about quitting drugs.  General instructions  Schedule regular health, dental, and eye exams.  Stay current with your vaccines.  Tell your health care provider if:  You often feel depressed.  You have ever been abused or do not feel safe at home.  Summary  Adopting a healthy lifestyle and getting preventive care are important in promoting health and wellness.  Follow your health care provider's instructions about healthy diet, exercising, and getting tested or screened for diseases.  Follow your health care provider's instructions on monitoring your cholesterol and blood pressure.  This information is not intended to replace advice given to you by your health care provider. Make sure you discuss any questions you have with your health care provider.  Document Revised: 11/24/2020 Document Reviewed: 11/24/2020  Elsevier Patient Education  2024 [employer].

## 2024-03-09 ENCOUNTER — Other Ambulatory Visit (INDEPENDENT_AMBULATORY_CARE_PROVIDER_SITE_OTHER)

## 2024-03-09 DIAGNOSIS — D696 Thrombocytopenia, unspecified: Secondary | ICD-10-CM | POA: Diagnosis not present

## 2024-03-09 LAB — VITAMIN B12: Vitamin B-12: 740 pg/mL (ref 211–911)

## 2024-03-09 LAB — FOLATE: Folate: 18.3 ng/mL (ref >5.9)

## 2024-03-10 ENCOUNTER — Other Ambulatory Visit: Payer: Self-pay | Admitting: Internal Medicine

## 2024-03-13 LAB — METHYLMALONIC ACID, SERUM: Methylmalonic Acid, Quant: 114 nmol/L (ref 55–335)

## 2024-04-16 ENCOUNTER — Other Ambulatory Visit: Payer: Self-pay | Admitting: Cardiology

## 2024-04-16 DIAGNOSIS — I10 Essential (primary) hypertension: Secondary | ICD-10-CM

## 2024-04-19 NOTE — Telephone Encounter (Signed)
 Dr. Joshua, I had made him PRN. Do you mind to pick up the Rx

## 2024-05-21 ENCOUNTER — Telehealth: Payer: Self-pay | Admitting: Cardiology

## 2024-05-21 ENCOUNTER — Other Ambulatory Visit (HOSPITAL_COMMUNITY): Payer: Self-pay

## 2024-05-21 NOTE — Telephone Encounter (Signed)
 Patient is seeing Dr Ladona as needed.  I spoke with [facility] and they will send refill request to Dr Joshua

## 2024-05-21 NOTE — Telephone Encounter (Signed)
 Good morning I have this patient here and he stated that he is out of fish oil and wants a refill but the pharmacy said he hasn't used it and was removed from his list. Can he get a refill or will he need to see you in office?

## 2024-06-16 ENCOUNTER — Other Ambulatory Visit: Payer: Self-pay | Admitting: Cardiology

## 2024-06-16 DIAGNOSIS — E781 Pure hyperglyceridemia: Secondary | ICD-10-CM

## 2024-06-16 DIAGNOSIS — R931 Abnormal findings on diagnostic imaging of heart and coronary circulation: Secondary | ICD-10-CM

## 2024-07-11 ENCOUNTER — Other Ambulatory Visit: Payer: Self-pay | Admitting: Internal Medicine

## 2024-07-11 DIAGNOSIS — I10 Essential (primary) hypertension: Secondary | ICD-10-CM

## 2024-08-10 ENCOUNTER — Other Ambulatory Visit: Payer: Self-pay | Admitting: Internal Medicine

## 2024-08-10 DIAGNOSIS — E785 Hyperlipidemia, unspecified: Secondary | ICD-10-CM

## 2024-08-16 ENCOUNTER — Other Ambulatory Visit (HOSPITAL_BASED_OUTPATIENT_CLINIC_OR_DEPARTMENT_OTHER): Payer: Self-pay | Admitting: Family Medicine

## 2024-08-16 ENCOUNTER — Other Ambulatory Visit: Payer: Self-pay | Admitting: Internal Medicine

## 2024-08-16 DIAGNOSIS — I10 Essential (primary) hypertension: Secondary | ICD-10-CM

## 2024-08-16 DIAGNOSIS — Z8249 Family history of ischemic heart disease and other diseases of the circulatory system: Secondary | ICD-10-CM

## 2024-08-16 DIAGNOSIS — I493 Ventricular premature depolarization: Secondary | ICD-10-CM

## 2024-09-03 ENCOUNTER — Other Ambulatory Visit (HOSPITAL_BASED_OUTPATIENT_CLINIC_OR_DEPARTMENT_OTHER)

## 2025-02-18 ENCOUNTER — Ambulatory Visit: Admitting: Adult Health
